# Patient Record
Sex: Female | Born: 1960 | Race: Black or African American | Hispanic: No | Marital: Married | Smoking: Never smoker
Health system: Southern US, Community
[De-identification: ages and names within clinical notes are randomized; demographics above are authoritative.]

## PROBLEM LIST (undated history)

## (undated) DIAGNOSIS — I10 Essential (primary) hypertension: Secondary | ICD-10-CM

## (undated) DIAGNOSIS — E119 Type 2 diabetes mellitus without complications: Secondary | ICD-10-CM

## (undated) DIAGNOSIS — G8929 Other chronic pain: Secondary | ICD-10-CM

## (undated) DIAGNOSIS — I509 Heart failure, unspecified: Secondary | ICD-10-CM

## (undated) DIAGNOSIS — G4730 Sleep apnea, unspecified: Secondary | ICD-10-CM

## (undated) DIAGNOSIS — M549 Dorsalgia, unspecified: Secondary | ICD-10-CM

## (undated) DIAGNOSIS — E7800 Pure hypercholesterolemia, unspecified: Secondary | ICD-10-CM

## (undated) DIAGNOSIS — G709 Myoneural disorder, unspecified: Secondary | ICD-10-CM

## (undated) HISTORY — DX: Heart failure, unspecified: I50.9

## (undated) HISTORY — PX: TUBAL LIGATION: SHX77

## (undated) HISTORY — DX: Type 2 diabetes mellitus without complications: E11.9

## (undated) HISTORY — PX: BACK SURGERY: SHX140

---

## 2005-05-10 ENCOUNTER — Ambulatory Visit: Payer: Self-pay | Admitting: Family Medicine

## 2008-06-18 ENCOUNTER — Ambulatory Visit: Payer: Self-pay | Admitting: Internal Medicine

## 2009-08-25 ENCOUNTER — Ambulatory Visit: Payer: Self-pay | Admitting: Internal Medicine

## 2010-02-09 ENCOUNTER — Encounter: Payer: Self-pay | Admitting: Gastroenterology

## 2010-02-10 ENCOUNTER — Encounter: Payer: Self-pay | Admitting: Gastroenterology

## 2010-02-12 ENCOUNTER — Telehealth (INDEPENDENT_AMBULATORY_CARE_PROVIDER_SITE_OTHER): Payer: Self-pay

## 2010-04-24 ENCOUNTER — Telehealth (INDEPENDENT_AMBULATORY_CARE_PROVIDER_SITE_OTHER): Payer: Self-pay

## 2010-04-24 ENCOUNTER — Encounter: Payer: Self-pay | Admitting: Gastroenterology

## 2010-09-22 NOTE — Letter (Signed)
Summary: Internal Other/triage/instructions  Internal Other/triage/instructions   Imported By: Cloria Spring LPN 16/05/9603 54:09:81  _____________________________________________________________________  External Attachment:    Type:   Image     Comment:   External Document

## 2010-09-22 NOTE — Letter (Signed)
Summary: TRIAGE ORDER  TRIAGE ORDER   Imported By: Ave Filter 02/09/2010 10:46:28  _____________________________________________________________________  External Attachment:    Type:   Image     Comment:   External Document  Appended Document: TRIAGE ORDER [place]. Continue ASA.  Appended Document: TRIAGE ORDER Rx and instructions for TCS was mailed to pt on 02/09/2010.

## 2010-09-22 NOTE — Progress Notes (Signed)
Summary: phone note/ new triage because of delay in pocedure  Phone Note Call from Patient   Summary of Call: Pt was re-triaged because of delay in procedure.  She had her Rx and instructions from the previous one. Initial call taken by: Cloria Spring LPN,  April 24, 2010 8:48 AM

## 2010-09-22 NOTE — Progress Notes (Signed)
Summary: Change in appt for TCS  Phone Note Call from Patient   Caller: Patient Summary of Call: Pt called to reschedule her TCS appt that was scheduled for 03/06/2010. She was moved to 03/20/2010 @ 7:30 AM. She is aware she will need to be at the hospital @ 6:30 AM. Selena Batten @ ENDO was informed. Initial call taken by: Cloria Spring LPN,  February 12, 2010 10:53 AM

## 2010-09-22 NOTE — Letter (Signed)
Summary: Internal Other/triage  Internal Other/triage   Imported By: Cloria Spring LPN 64/40/3474 25:95:63  _____________________________________________________________________  External Attachment:    Type:   Image     Comment:   External Document

## 2011-01-14 ENCOUNTER — Ambulatory Visit (INDEPENDENT_AMBULATORY_CARE_PROVIDER_SITE_OTHER): Payer: BC Managed Care – PPO | Admitting: Psychiatry

## 2011-01-14 DIAGNOSIS — F329 Major depressive disorder, single episode, unspecified: Secondary | ICD-10-CM

## 2011-01-21 ENCOUNTER — Encounter (INDEPENDENT_AMBULATORY_CARE_PROVIDER_SITE_OTHER): Payer: BC Managed Care – PPO | Admitting: Psychiatry

## 2011-01-21 DIAGNOSIS — F329 Major depressive disorder, single episode, unspecified: Secondary | ICD-10-CM

## 2011-02-04 ENCOUNTER — Encounter (INDEPENDENT_AMBULATORY_CARE_PROVIDER_SITE_OTHER): Payer: BC Managed Care – PPO | Admitting: Psychiatry

## 2011-02-04 DIAGNOSIS — F3289 Other specified depressive episodes: Secondary | ICD-10-CM

## 2011-02-04 DIAGNOSIS — F329 Major depressive disorder, single episode, unspecified: Secondary | ICD-10-CM

## 2011-02-11 ENCOUNTER — Encounter (HOSPITAL_COMMUNITY): Payer: BC Managed Care – PPO | Admitting: Psychiatry

## 2011-02-18 ENCOUNTER — Encounter (INDEPENDENT_AMBULATORY_CARE_PROVIDER_SITE_OTHER): Payer: BC Managed Care – PPO | Admitting: Psychiatry

## 2011-02-18 DIAGNOSIS — F329 Major depressive disorder, single episode, unspecified: Secondary | ICD-10-CM

## 2011-03-04 ENCOUNTER — Encounter (INDEPENDENT_AMBULATORY_CARE_PROVIDER_SITE_OTHER): Payer: BC Managed Care – PPO | Admitting: Psychiatry

## 2011-03-04 DIAGNOSIS — F329 Major depressive disorder, single episode, unspecified: Secondary | ICD-10-CM

## 2011-03-22 ENCOUNTER — Encounter (HOSPITAL_COMMUNITY): Payer: BC Managed Care – PPO | Admitting: Psychiatry

## 2011-04-01 ENCOUNTER — Encounter (INDEPENDENT_AMBULATORY_CARE_PROVIDER_SITE_OTHER): Payer: BC Managed Care – PPO | Admitting: Psychiatry

## 2011-04-01 DIAGNOSIS — F329 Major depressive disorder, single episode, unspecified: Secondary | ICD-10-CM

## 2011-05-13 ENCOUNTER — Encounter (HOSPITAL_COMMUNITY): Payer: BC Managed Care – PPO | Admitting: Psychiatry

## 2011-05-14 ENCOUNTER — Encounter (HOSPITAL_COMMUNITY): Payer: BC Managed Care – PPO | Admitting: Psychiatry

## 2011-05-27 ENCOUNTER — Encounter (HOSPITAL_COMMUNITY): Payer: BC Managed Care – PPO | Admitting: Psychiatry

## 2012-02-23 ENCOUNTER — Other Ambulatory Visit (HOSPITAL_COMMUNITY): Payer: Self-pay | Admitting: Family Medicine

## 2012-02-23 DIAGNOSIS — M549 Dorsalgia, unspecified: Secondary | ICD-10-CM

## 2012-02-28 ENCOUNTER — Ambulatory Visit (HOSPITAL_COMMUNITY)
Admission: RE | Admit: 2012-02-28 | Discharge: 2012-02-28 | Disposition: A | Discharge status: Home / Self Care | Payer: BC Managed Care – PPO | Source: Ambulatory Visit | Attending: Family Medicine | Admitting: Family Medicine

## 2012-02-28 ENCOUNTER — Other Ambulatory Visit (HOSPITAL_COMMUNITY): Payer: Self-pay | Admitting: Family Medicine

## 2012-02-28 DIAGNOSIS — M549 Dorsalgia, unspecified: Secondary | ICD-10-CM

## 2012-03-06 ENCOUNTER — Ambulatory Visit: Payer: Self-pay | Admitting: Family Medicine

## 2012-04-26 ENCOUNTER — Telehealth: Payer: Self-pay | Admitting: *Deleted

## 2012-04-26 ENCOUNTER — Telehealth: Payer: Self-pay

## 2012-04-26 NOTE — Telephone Encounter (Signed)
LOM to call.

## 2012-04-26 NOTE — Telephone Encounter (Signed)
See separate triage.  

## 2012-04-26 NOTE — Telephone Encounter (Signed)
Ms Daughety called today. She would like to set up a colonoscopy when you have time. Please call her back. Thank you.

## 2012-04-27 ENCOUNTER — Telehealth: Payer: Self-pay | Admitting: *Deleted

## 2012-04-27 NOTE — Telephone Encounter (Signed)
Bridget Little called today to set up her colonoscopy. She will be at her home number until 1:30 today. Thanks.

## 2012-04-28 ENCOUNTER — Ambulatory Visit: Payer: Self-pay

## 2012-05-02 ENCOUNTER — Other Ambulatory Visit: Payer: Self-pay

## 2012-05-02 ENCOUNTER — Telehealth: Payer: Self-pay

## 2012-05-02 DIAGNOSIS — Z139 Encounter for screening, unspecified: Secondary | ICD-10-CM

## 2012-05-02 NOTE — Telephone Encounter (Signed)
Gastroenterology Pre-Procedure Form     Request Date: 04/27/2012       Requesting Physician: Dr. Doreen Salvage     PATIENT INFORMATION:  Bridget Little is a 51 y.o., female (DOB=20-Aug-1961).  PROCEDURE: Procedure(s) requested: colonoscopy Procedure Reason: screening for colon cancer  PATIENT REVIEW QUESTIONS: The patient reports the following:   1. Diabetes Melitis: no 2. Joint replacements in the past 12 months: no 3. Major health problems in the past 3 months: no 4. Has an artificial valve or MVP:no 5. Has been advised in past to take antibiotics in advance of a procedure like teeth cleaning: no}    MEDICATIONS & ALLERGIES:    Patient reports the following regarding taking any blood thinners:   Plavix? no Aspirin?yes  Coumadin?  no  Patient confirms/reports the following medications:  Current Outpatient Prescriptions  Medication Sig Dispense Refill  . aspirin 81 MG tablet Take 81 mg by mouth daily.      . diclofenac (VOLTAREN) 75 MG EC tablet Take 75 mg by mouth 2 (two) times daily.      . furosemide (LASIX) 20 MG tablet Take 20 mg by mouth daily. As needed      . HYDROcodone-acetaminophen (NORCO) 7.5-325 MG per tablet Take 1 tablet by mouth every 4 (four) hours as needed.      Marland Kitchen lisinopril-hydrochlorothiazide (PRINZIDE,ZESTORETIC) 20-25 MG per tablet Take 1 tablet by mouth daily.      . NON FORMULARY Epi pen as needed for bee stings      . topiramate (TOPAMAX) 50 MG tablet Take 50 mg by mouth daily.        Patient confirms/reports the following allergies:  No Known Allergies  Patient is appropriate to schedule for requested procedure(s): yes  AUTHORIZATION INFORMATION Primary Insurance:   ID #:   Group #:  Pre-Cert / Auth required: Pre-Cert / Auth #:   Secondary Insurance  ID #:   Group #:  Pre-Cert / Auth required: Pre-Cert / Auth #:   No orders of the defined types were placed in this encounter.    SCHEDULE INFORMATION: Procedure has been scheduled as  follows:  Date: 06/02/2012                Time: 8:30 AM  Location: [facility] Short Stay  This Gastroenterology Pre-Precedure Form is being routed to the following provider(s) for review: Jonette Eva, MD

## 2012-05-02 NOTE — Telephone Encounter (Signed)
MOVI PREP SPLIT DOSING, REGULAR BREAKFAST. CLEAR LIQUIDS AFTER 9 AM.  HOLD VOLTAREN AND LASIX ON DAY BEFORE AND DAY OF PROCEDURE.

## 2012-05-02 NOTE — Telephone Encounter (Signed)
See separate triage.  

## 2012-05-03 MED ORDER — PEG-KCL-NACL-NASULF-NA ASC-C 100 G PO SOLR: 1.0000 | ORAL | Status: DC

## 2012-05-03 NOTE — Progress Notes (Signed)
Rx sent to the pharmacy. Instructions mailed to pt.  

## 2012-05-03 NOTE — Telephone Encounter (Signed)
See separate triage dated 05/02/2012.

## 2012-05-03 NOTE — Telephone Encounter (Signed)
Rx sent to pharmacy. Instructions mailed to pt.

## 2012-05-15 ENCOUNTER — Ambulatory Visit (HOSPITAL_COMMUNITY): Payer: BC Managed Care – PPO | Admitting: Physical Therapy

## 2012-05-17 ENCOUNTER — Telehealth: Payer: Self-pay | Admitting: Gastroenterology

## 2012-05-17 NOTE — Telephone Encounter (Signed)
Pt called to update meds. She has had some added on since she was triaged for the colonoscopy. She is scheduled for that on 06/02/2012. I have added the additional meds to the med list. Below are the ones she added.   1. Oxydodone  10/325 mg     Given by [facility]     She uses this in place of the Hydrocodone if her pain is severe up to tid  2.  Valium 5 mg      Only as needed  3.  Gabapentine 300 mg  Tid       Given by Dr. Retia Passe of the [facility]  4.  Provera  10 mg qd      By GYN  5.  Skelaxin  800 mg  Tid, prn     Said she rarely takes this

## 2012-05-17 NOTE — Telephone Encounter (Signed)
Pt called to give [place] her new medications she is on. Pt is scheduled for a procedure on 06/02/12. She can be reached at [phone]

## 2012-05-19 NOTE — Telephone Encounter (Signed)
REVIEWED.  

## 2012-05-24 ENCOUNTER — Encounter (HOSPITAL_COMMUNITY): Payer: Self-pay | Admitting: Pharmacy Technician

## 2012-05-29 ENCOUNTER — Telehealth: Payer: Self-pay

## 2012-05-29 NOTE — Telephone Encounter (Signed)
I had a POST IT NOTE on my desk when I came back from lunch to call pt in reference to questions about insurance. I called, LMOM to call.

## 2012-05-30 NOTE — Telephone Encounter (Signed)
Pt called back and said she checked her insurance and they said they would cover colonoscopy.

## 2012-05-31 ENCOUNTER — Telehealth: Payer: Self-pay

## 2012-05-31 NOTE — Telephone Encounter (Signed)
LMOM to call. Need to update triage prior to procedure on 06/02/2012.

## 2012-06-01 MED ORDER — SODIUM CHLORIDE 0.45 % IV SOLN
INTRAVENOUS | Status: DC
  Administered 2012-06-02: 08:00:00 via INTRAVENOUS

## 2012-06-01 NOTE — Telephone Encounter (Signed)
Pt has not had any change in meds and no new problems since she was triaged. I told her OK to take BP pill ( Lisinopril ) with sip of water on day of procedure. She is scheduled for 06/02/2012.Marland Kitchen

## 2012-06-02 ENCOUNTER — Encounter (HOSPITAL_COMMUNITY)
Admission: RE | Disposition: A | Discharge status: Home / Self Care | Payer: Self-pay | Source: Ambulatory Visit | Attending: Gastroenterology

## 2012-06-02 ENCOUNTER — Ambulatory Visit (HOSPITAL_COMMUNITY)
Admission: RE | Admit: 2012-06-02 | Discharge: 2012-06-02 | Disposition: A | Discharge status: Home / Self Care | Payer: BC Managed Care – PPO | Source: Ambulatory Visit | Attending: Gastroenterology | Admitting: Gastroenterology

## 2012-06-02 ENCOUNTER — Encounter (HOSPITAL_COMMUNITY): Payer: Self-pay | Admitting: *Deleted

## 2012-06-02 DIAGNOSIS — Z1211 Encounter for screening for malignant neoplasm of colon: Secondary | ICD-10-CM

## 2012-06-02 DIAGNOSIS — K648 Other hemorrhoids: Secondary | ICD-10-CM | POA: Insufficient documentation

## 2012-06-02 DIAGNOSIS — Z139 Encounter for screening, unspecified: Secondary | ICD-10-CM

## 2012-06-02 DIAGNOSIS — I10 Essential (primary) hypertension: Secondary | ICD-10-CM | POA: Insufficient documentation

## 2012-06-02 HISTORY — DX: Myoneural disorder, unspecified: G70.9

## 2012-06-02 HISTORY — DX: Dorsalgia, unspecified: M54.9

## 2012-06-02 HISTORY — DX: Pure hypercholesterolemia, unspecified: E78.00

## 2012-06-02 HISTORY — PX: COLONOSCOPY: SHX5424

## 2012-06-02 HISTORY — DX: Other chronic pain: G89.29

## 2012-06-02 SURGERY — COLONOSCOPY
Anesthesia: Moderate Sedation

## 2012-06-02 MED ORDER — MIDAZOLAM HCL 5 MG/5ML IJ SOLN
INTRAMUSCULAR | Status: AC
  Filled 2012-06-02: qty 10

## 2012-06-02 MED ORDER — MEPERIDINE HCL 100 MG/ML IJ SOLN
INTRAMUSCULAR | Status: AC
  Filled 2012-06-02: qty 2

## 2012-06-02 MED ORDER — STERILE WATER FOR IRRIGATION IR SOLN
Status: DC | PRN
  Administered 2012-06-02: 09:00:00

## 2012-06-02 MED ORDER — MIDAZOLAM HCL 5 MG/5ML IJ SOLN
INTRAMUSCULAR | Status: DC | PRN
  Administered 2012-06-02: 1 mg via INTRAVENOUS
  Administered 2012-06-02 (×2): 2 mg via INTRAVENOUS

## 2012-06-02 MED ORDER — MEPERIDINE HCL 100 MG/ML IJ SOLN
INTRAMUSCULAR | Status: DC | PRN
  Administered 2012-06-02: 25 mg via INTRAVENOUS
  Administered 2012-06-02: 50 mg via INTRAVENOUS
  Administered 2012-06-02: 25 mg via INTRAVENOUS

## 2012-06-02 NOTE — Op Note (Addendum)
[facility] [address] [place], [address]   COLONOSCOPY PROCEDURE REPORT  PATIENT: Bridget Little, Bridget Little  MR#: [id number] BIRTHDATE: 07-15-1961 , 51  yrs. old GENDER: Female ENDOSCOPIST: Jonette Eva, MD REFERRED VH:QIONGE Dahlia Bailiff, M.D. PROCEDURE DATE:  06/02/2012 PROCEDURE:   INCOMPLETE Colonoscopy, screening INDICATIONS:average risk patient for colon cancer. MEDICATIONS: Demerol 100 mg IV and Versed 5 mg IV  DESCRIPTION OF PROCEDURE:    Physical exam was performed.  Informed consent was obtained from the patient after explaining the benefits, risks, and alternatives to procedure.  The patient was connected to monitor and placed in left lateral position. Continuous oxygen was provided by nasal cannula and IV medicine administered through an indwelling cannula.  After administration of sedation and rectal exam, the patients rectum was intubated and the Pentax Colonoscope [id number]  colonoscope was advanced under direct visualization to the ILEO-CECAL VALVE.  The scope was removed slowly by carefully examining the color, texture, anatomy, and integrity mucosa on the way out.  The patient was recovered in endoscopy and discharged home in satisfactory condition.       COLON FINDINGS: The colon was otherwise normal.  There was no diverticulosis, inflammation, polyps or cancers unless previously stated. REDUNDNAT COLON WHICH DID NOT ALLOW FOR CECAL INTUBATION INSPITE OF CHANGING POSITION AND PRERSSURE. and Small internal hemorrhoids were found.  PREP QUALITY: good. WITHDRAWAL TIME FROM IV VALVE: 6 minutes  COMPLICATIONS: None  ENDOSCOPIC IMPRESSION: 1.   The colon was otherwise normal. COMPLETE CECUM NOT SEEN. 2.   Small internal hemorrhoids   RECOMMENDATIONS: PT SHOULD INITIATE A WEIGHT LOSS PROGRAM.  HIGH FIBER/LOW FAT DIET  TCS IN 5 YEARS WITH AN OVERTUBE.       _______________________________ Rosalie DoctorJonette Eva, MD 06/02/2012 9:48  AM Revised: 06/02/2012 9:48 AM    PATIENT NAME:  Bridget Little, Bridget Little MR#: [id number]

## 2012-06-02 NOTE — H&P (Signed)
Primary Care Physician:  Margorie John, MD Primary Gastroenterologist:  Dr. Darrick Penna  Pre-Procedure History & Physical: HPI:  Bridget Little is a 51 y.o. female here for COLON CANCER SCREENING.   Past Medical History  Diagnosis Date  . Hypertension   . Hypercholesteremia   . Numbness and tingling in right hand   . Chronic back pain   . Neuromuscular disorder     numbness in right hand and left foot    Past Surgical History  Procedure Date  . Tubal ligation     Prior to Admission medications   Medication Sig Start Date End Date Taking? Authorizing Provider  aspirin 81 MG tablet Take 81 mg by mouth daily.   Yes Historical Provider, MD  Calcium Carb-Cholecalciferol (CALCIUM + D3) 600-200 MG-UNIT TABS Take 1 tablet by mouth 2 (two) times daily.   Yes Historical Provider, MD  diazepam (VALIUM) 5 MG tablet Take 5 mg by mouth every 6 (six) hours as needed. She said she seldom takes this   Yes Historical Provider, MD  furosemide (LASIX) 20 MG tablet Take 20 mg by mouth daily as needed. fluid   Yes Historical Provider, MD  gabapentin (NEURONTIN) 300 MG capsule Take 300 mg by mouth 3 (three) times daily.   Yes Historical Provider, MD  HYDROcodone-acetaminophen (NORCO) 7.5-325 MG per tablet Take 1 tablet by mouth every 4 (four) hours as needed. pain   Yes Historical Provider, MD  lisinopril-hydrochlorothiazide (PRINZIDE,ZESTORETIC) 20-25 MG per tablet Take 1 tablet by mouth daily.   Yes Historical Provider, MD  medroxyPROGESTERone (PROVERA) 10 MG tablet Take 10 mg by mouth daily.   Yes Historical Provider, MD  metaxalone (SKELAXIN) 800 MG tablet Take 800 mg by mouth 3 (three) times daily as needed. Said she seldom takes this   Yes Historical Provider, MD  oxyCODONE-acetaminophen (PERCOCET) 10-325 MG per tablet Take 1 tablet by mouth every 8 (eight) hours as needed. She takes this instead of her Hydrocodone if her pain is severe. She does not take this and Hydrocodone together.   Yes  Historical Provider, MD  NON FORMULARY Epi pen as needed for bee stings    Historical Provider, MD    Allergies as of 05/02/2012  . (No Known Allergies)    Family History  Problem Relation Age of Onset  . Colon cancer Neg Hx     History   Social History  . Marital Status: Married    Spouse Name: N/A    Number of Children: N/A  . Years of Education: N/A   Occupational History  . Not on file.   Social History Main Topics  . Smoking status: Never Smoker   . Smokeless tobacco: Not on file  . Alcohol Use: Yes     Occasionally  . Drug Use: No  . Sexually Active:    Other Topics Concern  . Not on file   Social History Narrative  . No narrative on file    Review of Systems: See HPI, otherwise negative ROS   Physical Exam: BP 147/79  Pulse 92  Temp 97.6 F (36.4 C) (Oral)  Resp 22  Ht 5\' 4"  (1.626 m)  Wt 290 lb (131.543 kg)  BMI 49.78 kg/m2  SpO2 94%  LMP 05/19/2012 General:   Alert,  pleasant and cooperative in NAD Head:  Normocephalic and atraumatic. Neck:  Supple; Lungs:  Clear throughout to auscultation.    Heart:  Regular rate and rhythm. Abdomen:  Soft, nontender and nondistended. Normal bowel sounds, without  guarding, and without rebound.   Neurologic:  Alert and  oriented x4;  grossly normal neurologically.  Impression/Plan:     SCREENING  Plan:  1. TCS TODAY

## 2012-06-09 ENCOUNTER — Encounter (HOSPITAL_COMMUNITY): Payer: Self-pay | Admitting: Gastroenterology

## 2013-06-04 ENCOUNTER — Ambulatory Visit: Payer: BC Managed Care – PPO | Admitting: Cardiology

## 2013-06-12 ENCOUNTER — Ambulatory Visit: Payer: Self-pay

## 2013-06-25 ENCOUNTER — Ambulatory Visit: Payer: BC Managed Care – PPO | Admitting: Cardiology

## 2013-10-22 ENCOUNTER — Encounter (HOSPITAL_COMMUNITY): Payer: Self-pay | Admitting: Emergency Medicine

## 2013-10-22 ENCOUNTER — Inpatient Hospital Stay (HOSPITAL_COMMUNITY): Payer: BC Managed Care – PPO

## 2013-10-22 ENCOUNTER — Emergency Department (HOSPITAL_COMMUNITY): Payer: BC Managed Care – PPO

## 2013-10-22 ENCOUNTER — Inpatient Hospital Stay (HOSPITAL_COMMUNITY)
Admission: EM | Admit: 2013-10-22 | Discharge: 2013-10-28 | DRG: 286 | Disposition: A | Discharge status: Home / Self Care | Payer: BC Managed Care – PPO | Attending: Family Medicine | Admitting: Family Medicine

## 2013-10-22 DIAGNOSIS — I509 Heart failure, unspecified: Secondary | ICD-10-CM | POA: Diagnosis present

## 2013-10-22 DIAGNOSIS — I2510 Atherosclerotic heart disease of native coronary artery without angina pectoris: Secondary | ICD-10-CM | POA: Diagnosis present

## 2013-10-22 DIAGNOSIS — R062 Wheezing: Secondary | ICD-10-CM

## 2013-10-22 DIAGNOSIS — E119 Type 2 diabetes mellitus without complications: Secondary | ICD-10-CM | POA: Diagnosis present

## 2013-10-22 DIAGNOSIS — R778 Other specified abnormalities of plasma proteins: Secondary | ICD-10-CM | POA: Diagnosis present

## 2013-10-22 DIAGNOSIS — I10 Essential (primary) hypertension: Secondary | ICD-10-CM | POA: Diagnosis present

## 2013-10-22 DIAGNOSIS — E11649 Type 2 diabetes mellitus with hypoglycemia without coma: Secondary | ICD-10-CM | POA: Diagnosis present

## 2013-10-22 DIAGNOSIS — I428 Other cardiomyopathies: Secondary | ICD-10-CM | POA: Diagnosis present

## 2013-10-22 DIAGNOSIS — R0989 Other specified symptoms and signs involving the circulatory and respiratory systems: Secondary | ICD-10-CM

## 2013-10-22 DIAGNOSIS — Z6841 Body Mass Index (BMI) 40.0 and over, adult: Secondary | ICD-10-CM

## 2013-10-22 DIAGNOSIS — I5021 Acute systolic (congestive) heart failure: Secondary | ICD-10-CM | POA: Diagnosis present

## 2013-10-22 DIAGNOSIS — E7800 Pure hypercholesterolemia, unspecified: Secondary | ICD-10-CM | POA: Diagnosis present

## 2013-10-22 DIAGNOSIS — R0609 Other forms of dyspnea: Secondary | ICD-10-CM

## 2013-10-22 DIAGNOSIS — R209 Unspecified disturbances of skin sensation: Secondary | ICD-10-CM | POA: Diagnosis present

## 2013-10-22 DIAGNOSIS — R799 Abnormal finding of blood chemistry, unspecified: Secondary | ICD-10-CM

## 2013-10-22 DIAGNOSIS — J209 Acute bronchitis, unspecified: Secondary | ICD-10-CM | POA: Diagnosis present

## 2013-10-22 DIAGNOSIS — G8929 Other chronic pain: Secondary | ICD-10-CM | POA: Diagnosis present

## 2013-10-22 DIAGNOSIS — J811 Chronic pulmonary edema: Secondary | ICD-10-CM | POA: Diagnosis present

## 2013-10-22 DIAGNOSIS — R7989 Other specified abnormal findings of blood chemistry: Secondary | ICD-10-CM

## 2013-10-22 DIAGNOSIS — M549 Dorsalgia, unspecified: Secondary | ICD-10-CM | POA: Diagnosis present

## 2013-10-22 DIAGNOSIS — R0600 Dyspnea, unspecified: Secondary | ICD-10-CM

## 2013-10-22 DIAGNOSIS — J9601 Acute respiratory failure with hypoxia: Secondary | ICD-10-CM | POA: Diagnosis present

## 2013-10-22 DIAGNOSIS — I5043 Acute on chronic combined systolic (congestive) and diastolic (congestive) heart failure: Principal | ICD-10-CM | POA: Diagnosis present

## 2013-10-22 DIAGNOSIS — Z7982 Long term (current) use of aspirin: Secondary | ICD-10-CM

## 2013-10-22 DIAGNOSIS — J9600 Acute respiratory failure, unspecified whether with hypoxia or hypercapnia: Secondary | ICD-10-CM | POA: Diagnosis present

## 2013-10-22 HISTORY — DX: Essential (primary) hypertension: I10

## 2013-10-22 LAB — CBC WITH DIFFERENTIAL/PLATELET
BASOS ABS: 0 10*3/uL (ref 0.0–0.1)
Basophils Relative: 0 % (ref 0–1)
EOS PCT: 1 % (ref 0–5)
Eosinophils Absolute: 0.1 10*3/uL (ref 0.0–0.7)
HEMATOCRIT: 41.5 % (ref 36.0–46.0)
HEMOGLOBIN: 12.9 g/dL (ref 12.0–15.0)
LYMPHS PCT: 14 % (ref 12–46)
Lymphs Abs: 1.6 10*3/uL (ref 0.7–4.0)
MCH: 25.3 pg — ABNORMAL LOW (ref 26.0–34.0)
MCHC: 31.1 g/dL (ref 30.0–36.0)
MCV: 81.5 fL (ref 78.0–100.0)
MONO ABS: 1.1 10*3/uL — AB (ref 0.1–1.0)
MONOS PCT: 9 % (ref 3–12)
Neutro Abs: 8.8 10*3/uL — ABNORMAL HIGH (ref 1.7–7.7)
Neutrophils Relative %: 75 % (ref 43–77)
Platelets: 378 10*3/uL (ref 150–400)
RBC: 5.09 MIL/uL (ref 3.87–5.11)
RDW: 19.2 % — AB (ref 11.5–15.5)
WBC: 11.7 10*3/uL — AB (ref 4.0–10.5)

## 2013-10-22 LAB — TROPONIN I
Troponin I: 0.8 ng/mL (ref <0.30)
Troponin I: 0.63 ng/mL (ref <0.30)
Troponin I: 0.48 ng/mL (ref <0.30)

## 2013-10-22 LAB — COMPREHENSIVE METABOLIC PANEL
ALT: 28 U/L (ref 0–35)
AST: 27 U/L (ref 0–37)
Albumin: 3.2 g/dL — ABNORMAL LOW (ref 3.5–5.2)
Alkaline Phosphatase: 103 U/L (ref 39–117)
BILIRUBIN TOTAL: 0.2 mg/dL — AB (ref 0.3–1.2)
BUN: 24 mg/dL — AB (ref 6–23)
CALCIUM: 8.9 mg/dL (ref 8.4–10.5)
CO2: 27 meq/L (ref 19–32)
CREATININE: 0.74 mg/dL (ref 0.50–1.10)
Chloride: 105 mEq/L (ref 96–112)
GLUCOSE: 180 mg/dL — AB (ref 70–99)
Potassium: 4 mEq/L (ref 3.7–5.3)
Sodium: 143 mEq/L (ref 137–147)
Total Protein: 7 g/dL (ref 6.0–8.3)

## 2013-10-22 LAB — GLUCOSE, CAPILLARY
GLUCOSE-CAPILLARY: 258 mg/dL — AB (ref 70–99)
Glucose-Capillary: 221 mg/dL — ABNORMAL HIGH (ref 70–99)
Glucose-Capillary: 264 mg/dL — ABNORMAL HIGH (ref 70–99)

## 2013-10-22 LAB — TSH: TSH: 2.752 u[IU]/mL (ref 0.350–4.500)

## 2013-10-22 LAB — HEMOGLOBIN A1C
Hgb A1c MFr Bld: 6.1 % — ABNORMAL HIGH (ref <5.7)
Mean Plasma Glucose: 128 mg/dL — ABNORMAL HIGH (ref <117)

## 2013-10-22 LAB — D-DIMER, QUANTITATIVE (NOT AT ARMC): D DIMER QUANT: 0.78 ug{FEU}/mL — AB (ref 0.00–0.48)

## 2013-10-22 LAB — PRO B NATRIURETIC PEPTIDE: PRO B NATRI PEPTIDE: 642.6 pg/mL — AB (ref 0–125)

## 2013-10-22 MED ORDER — ENOXAPARIN SODIUM 80 MG/0.8ML ~~LOC~~ SOLN: 70.0000 mg | SUBCUTANEOUS | Status: DC

## 2013-10-22 MED ORDER — LEVOFLOXACIN IN D5W 750 MG/150ML IV SOLN
750.0000 mg | INTRAVENOUS | Status: DC
  Administered 2013-10-22 – 2013-10-23 (×2): 750 mg via INTRAVENOUS
  Filled 2013-10-22 (×2): qty 150

## 2013-10-22 MED ORDER — ONDANSETRON HCL 4 MG/2ML IJ SOLN: 4.0000 mg | Freq: Four times a day (QID) | INTRAMUSCULAR | Status: DC | PRN

## 2013-10-22 MED ORDER — ACETAMINOPHEN 325 MG PO TABS: 650.0000 mg | ORAL_TABLET | Freq: Four times a day (QID) | ORAL | Status: DC | PRN

## 2013-10-22 MED ORDER — FUROSEMIDE 10 MG/ML IJ SOLN
40.0000 mg | Freq: Once | INTRAMUSCULAR | Status: AC
  Administered 2013-10-22: 40 mg via INTRAVENOUS
  Filled 2013-10-22: qty 4

## 2013-10-22 MED ORDER — FUROSEMIDE 10 MG/ML IJ SOLN
60.0000 mg | Freq: Two times a day (BID) | INTRAMUSCULAR | Status: DC
  Administered 2013-10-22 – 2013-10-26 (×8): 60 mg via INTRAVENOUS
  Filled 2013-10-22 (×8): qty 6

## 2013-10-22 MED ORDER — SODIUM CHLORIDE 0.9 % IV SOLN
250.0000 mL | INTRAVENOUS | Status: DC | PRN
  Administered 2013-10-22: 250 mL via INTRAVENOUS

## 2013-10-22 MED ORDER — OXYCODONE HCL 5 MG PO TABS: 5.0000 mg | ORAL_TABLET | ORAL | Status: DC | PRN

## 2013-10-22 MED ORDER — IOHEXOL 350 MG/ML SOLN
100.0000 mL | Freq: Once | INTRAVENOUS | Status: AC | PRN
  Administered 2013-10-22: 100 mL via INTRAVENOUS

## 2013-10-22 MED ORDER — SODIUM CHLORIDE 0.9 % IJ SOLN
3.0000 mL | INTRAMUSCULAR | Status: DC | PRN
  Administered 2013-10-23: 3 mL via INTRAVENOUS

## 2013-10-22 MED ORDER — HYDRALAZINE HCL 20 MG/ML IJ SOLN: 10.0000 mg | Freq: Four times a day (QID) | INTRAMUSCULAR | Status: DC | PRN

## 2013-10-22 MED ORDER — ENOXAPARIN SODIUM 40 MG/0.4ML ~~LOC~~ SOLN: 40.0000 mg | SUBCUTANEOUS | Status: DC

## 2013-10-22 MED ORDER — METHYLPREDNISOLONE SODIUM SUCC 125 MG IJ SOLR
125.0000 mg | Freq: Once | INTRAMUSCULAR | Status: AC
  Administered 2013-10-22: 125 mg via INTRAVENOUS
  Filled 2013-10-22: qty 2

## 2013-10-22 MED ORDER — SODIUM CHLORIDE 0.9 % IJ SOLN
3.0000 mL | Freq: Two times a day (BID) | INTRAMUSCULAR | Status: DC
  Administered 2013-10-23 – 2013-10-27 (×9): 3 mL via INTRAVENOUS

## 2013-10-22 MED ORDER — LEVALBUTEROL HCL 0.63 MG/3ML IN NEBU: 0.6300 mg | INHALATION_SOLUTION | RESPIRATORY_TRACT | Status: DC | PRN

## 2013-10-22 MED ORDER — IPRATROPIUM-ALBUTEROL 0.5-2.5 (3) MG/3ML IN SOLN
3.0000 mL | Freq: Once | RESPIRATORY_TRACT | Status: AC
  Administered 2013-10-22: 3 mL via RESPIRATORY_TRACT
  Filled 2013-10-22: qty 3

## 2013-10-22 MED ORDER — INSULIN ASPART 100 UNIT/ML ~~LOC~~ SOLN
0.0000 [IU] | Freq: Every day | SUBCUTANEOUS | Status: DC
  Administered 2013-10-22: 3 [IU] via SUBCUTANEOUS
  Administered 2013-10-23 – 2013-10-26 (×4): 2 [IU] via SUBCUTANEOUS

## 2013-10-22 MED ORDER — INSULIN ASPART 100 UNIT/ML ~~LOC~~ SOLN
0.0000 [IU] | Freq: Three times a day (TID) | SUBCUTANEOUS | Status: DC
Start: 2013-10-22 — End: 2013-10-28
  Administered 2013-10-22: 11 [IU] via SUBCUTANEOUS
  Administered 2013-10-22: 7 [IU] via SUBCUTANEOUS
  Administered 2013-10-23: 11 [IU] via SUBCUTANEOUS
  Administered 2013-10-24: 4 [IU] via SUBCUTANEOUS
  Administered 2013-10-24: 3 [IU] via SUBCUTANEOUS
  Administered 2013-10-25: 7 [IU] via SUBCUTANEOUS
  Administered 2013-10-25 (×2): 4 [IU] via SUBCUTANEOUS
  Administered 2013-10-26: 7 [IU] via SUBCUTANEOUS
  Administered 2013-10-26 (×2): 4 [IU] via SUBCUTANEOUS
  Administered 2013-10-27: 7 [IU] via SUBCUTANEOUS
  Administered 2013-10-27 – 2013-10-28 (×3): 4 [IU] via SUBCUTANEOUS

## 2013-10-22 MED ORDER — METHYLPREDNISOLONE SODIUM SUCC 125 MG IJ SOLR
60.0000 mg | Freq: Four times a day (QID) | INTRAMUSCULAR | Status: DC
  Administered 2013-10-22 – 2013-10-23 (×5): 60 mg via INTRAVENOUS
  Filled 2013-10-22 (×5): qty 2

## 2013-10-22 MED ORDER — ONDANSETRON HCL 4 MG PO TABS: 4.0000 mg | ORAL_TABLET | Freq: Four times a day (QID) | ORAL | Status: DC | PRN

## 2013-10-22 MED ORDER — ASPIRIN EC 81 MG PO TBEC
81.0000 mg | DELAYED_RELEASE_TABLET | Freq: Every day | ORAL | Status: DC
  Administered 2013-10-22 – 2013-10-28 (×7): 81 mg via ORAL
  Filled 2013-10-22 (×7): qty 1

## 2013-10-22 MED ORDER — ACETAMINOPHEN 650 MG RE SUPP: 650.0000 mg | Freq: Four times a day (QID) | RECTAL | Status: DC | PRN

## 2013-10-22 MED ORDER — ALBUTEROL SULFATE (2.5 MG/3ML) 0.083% IN NEBU
2.5000 mg | INHALATION_SOLUTION | Freq: Once | RESPIRATORY_TRACT | Status: AC
  Administered 2013-10-22: 2.5 mg via RESPIRATORY_TRACT
  Filled 2013-10-22: qty 3

## 2013-10-22 MED ORDER — LEVALBUTEROL HCL 0.63 MG/3ML IN NEBU
0.6300 mg | INHALATION_SOLUTION | RESPIRATORY_TRACT | Status: DC
  Administered 2013-10-22 – 2013-10-24 (×13): 0.63 mg via RESPIRATORY_TRACT
  Filled 2013-10-22 (×13): qty 3

## 2013-10-22 MED ORDER — IPRATROPIUM BROMIDE 0.02 % IN SOLN
0.5000 mg | Freq: Once | RESPIRATORY_TRACT | Status: AC
  Administered 2013-10-22: 0.5 mg via RESPIRATORY_TRACT
  Filled 2013-10-22: qty 2.5

## 2013-10-22 MED ORDER — SODIUM CHLORIDE 0.9 % IJ SOLN
3.0000 mL | Freq: Two times a day (BID) | INTRAMUSCULAR | Status: DC
  Administered 2013-10-22 – 2013-10-23 (×3): 3 mL via INTRAVENOUS

## 2013-10-22 MED ORDER — ENOXAPARIN SODIUM 150 MG/ML ~~LOC~~ SOLN
150.0000 mg | Freq: Two times a day (BID) | SUBCUTANEOUS | Status: DC
  Administered 2013-10-22 – 2013-10-23 (×4): 150 mg via SUBCUTANEOUS
  Filled 2013-10-22 (×9): qty 1

## 2013-10-22 NOTE — ED Notes (Signed)
Patient states she began having symptoms of sinus infection, but tonight presents with shortness of breath and wheezing.

## 2013-10-22 NOTE — ED Provider Notes (Signed)
CSN: [id number]     Arrival date & time 10/22/13  2440 History   First MD Initiated Contact with Patient 10/22/13 0408     Chief Complaint  Patient presents with  . Wheezing     (Consider location/radiation/quality/duration/timing/severity/associated sxs/prior Treatment) Patient is a 53 y.o. female presenting with wheezing. The history is provided by the patient.  Wheezing She started coughing yesterday afternoon and it has gotten progressively worse. Cough has been associated with wheezing. Cough has been nonproductive. She denies fever, chills, sweats but she has had a tight feeling in her chest. There's been no nausea vomiting or diarrhea. There've been no arthralgias or myalgias. There was a contact in that her husband had a respiratory infection recently. She has not been able to treated at home. She did notice that symptoms are worse when she tried to exert herself but were not affected by lying flat.  Past Medical History  Diagnosis Date  . Hypertension   . Hypercholesteremia   . Numbness and tingling in right hand   . Chronic back pain   . Neuromuscular disorder     numbness in right hand and left foot   Past Surgical History  Procedure Laterality Date  . Tubal ligation    . Colonoscopy  06/02/2012    Procedure: COLONOSCOPY;  Surgeon: Danie Binder, MD;  Location: [facility] SUITE;  Service: Endoscopy;  Laterality: N/A;  8:30 AM   Family History  Problem Relation Age of Onset  . Colon cancer Neg Hx    History  Substance Use Topics  . Smoking status: Never Smoker   . Smokeless tobacco: Not on file  . Alcohol Use: Yes     Comment: Occasionally   OB History   Grav Para Term Preterm Abortions TAB SAB Ect Mult Living                 Review of Systems  Respiratory: Positive for wheezing.   All other systems reviewed and are negative.      Allergies  Review of patient's allergies indicates no known allergies.  Home Medications   Current Outpatient Rx  Name   Route  Sig  Dispense  Refill  . aspirin 81 MG tablet   Oral   Take 81 mg by mouth daily.         . Calcium Carb-Cholecalciferol (CALCIUM + D3) 600-200 MG-UNIT TABS   Oral   Take 1 tablet by mouth 2 (two) times daily.         . diazepam (VALIUM) 5 MG tablet   Oral   Take 5 mg by mouth every 6 (six) hours as needed. She said she seldom takes this         . furosemide (LASIX) 20 MG tablet   Oral   Take 20 mg by mouth daily as needed. fluid         . gabapentin (NEURONTIN) 300 MG capsule   Oral   Take 300 mg by mouth 3 (three) times daily.         Marland Kitchen HYDROcodone-acetaminophen (NORCO) 7.5-325 MG per tablet   Oral   Take 1 tablet by mouth every 4 (four) hours as needed. pain         . lisinopril-hydrochlorothiazide (PRINZIDE,ZESTORETIC) 20-25 MG per tablet   Oral   Take 1 tablet by mouth daily.         . medroxyPROGESTERone (PROVERA) 10 MG tablet   Oral   Take 10 mg by mouth daily.         Marland Kitchen  metaxalone (SKELAXIN) 800 MG tablet   Oral   Take 800 mg by mouth 3 (three) times daily as needed. Said she seldom takes this         . NON FORMULARY      Epi pen as needed for bee stings         . oxyCODONE-acetaminophen (PERCOCET) 10-325 MG per tablet   Oral   Take 1 tablet by mouth every 8 (eight) hours as needed. She takes this instead of her Hydrocodone if her pain is severe. She does not take this and Hydrocodone together.          BP 162/87  Pulse 131  Temp(Src) 98.7 F (37.1 C) (Oral)  Resp 40  Ht 5\' 4"  (1.626 m)  Wt 310 lb (140.615 kg)  BMI 53.19 kg/m2  SpO2 93% Physical Exam  Nursing note and vitals reviewed.  Morbidly obese 53 year old female, resting comfortably and in no acute distress. Vital signs are significant for hypertension with blood pressure 162/87, tachycardia with heart rate 131, and tachypnea with respiratory rate of 40. Oxygen saturation is 93%, which is normal. Head is normocephalic and atraumatic. PERRLA, EOMI. Oropharynx is  clear. Neck is nontender and supple without adenopathy or JVD. Back is nontender and there is no CVA tenderness. Lungs have diffuse expiratory wheezes with prolonged exhalation phase. No rales are heard. Chest is nontender. Heart has regular rate and rhythm without murmur. Abdomen is soft, flat, nontender without masses or hepatosplenomegaly and peristalsis is normoactive. Extremities have 2-3+ edema, full range of motion is present. Skin is warm and dry without rash. Neurologic: Mental status is normal, cranial nerves are intact, there are no motor or sensory deficits.  ED Course  Procedures (including critical care time) Labs Review Results for orders placed during the hospital encounter of 10/22/13  CBC WITH DIFFERENTIAL      Result Value Ref Range   WBC 11.7 (*) 4.0 - 10.5 K/uL   RBC 5.09  3.87 - 5.11 MIL/uL   Hemoglobin 12.9  12.0 - 15.0 g/dL   HCT 41.5  36.0 - 46.0 %   MCV 81.5  78.0 - 100.0 fL   MCH 25.3 (*) 26.0 - 34.0 pg   MCHC 31.1  30.0 - 36.0 g/dL   RDW 19.2 (*) 11.5 - 15.5 %   Platelets 378  150 - 400 K/uL   Neutrophils Relative % 75  43 - 77 %   Neutro Abs 8.8 (*) 1.7 - 7.7 K/uL   Lymphocytes Relative 14  12 - 46 %   Lymphs Abs 1.6  0.7 - 4.0 K/uL   Monocytes Relative 9  3 - 12 %   Monocytes Absolute 1.1 (*) 0.1 - 1.0 K/uL   Eosinophils Relative 1  0 - 5 %   Eosinophils Absolute 0.1  0.0 - 0.7 K/uL   Basophils Relative 0  0 - 1 %   Basophils Absolute 0.0  0.0 - 0.1 K/uL  COMPREHENSIVE METABOLIC PANEL      Result Value Ref Range   Sodium 143  137 - 147 mEq/L   Potassium 4.0  3.7 - 5.3 mEq/L   Chloride 105  96 - 112 mEq/L   CO2 27  19 - 32 mEq/L   Glucose, Bld 180 (*) 70 - 99 mg/dL   BUN 24 (*) 6 - 23 mg/dL   Creatinine, Ser 0.74  0.50 - 1.10 mg/dL   Calcium 8.9  8.4 - 10.5 mg/dL   Total Protein  7.0  6.0 - 8.3 g/dL   Albumin 3.2 (*) 3.5 - 5.2 g/dL   AST 27  0 - 37 U/L   ALT 28  0 - 35 U/L   Alkaline Phosphatase 103  39 - 117 U/L   Total Bilirubin 0.2 (*)  0.3 - 1.2 mg/dL   GFR calc non Af Amer >90  >90 mL/min   GFR calc Af Amer >90  >90 mL/min  PRO B NATRIURETIC PEPTIDE      Result Value Ref Range   Pro B Natriuretic peptide (BNP) 642.6 (*) 0 - 125 pg/mL  TROPONIN I      Result Value Ref Range   Troponin I <0.30  <0.30 ng/mL   Imaging Review Dg Chest 2 View  10/22/2013   CLINICAL DATA:  Shortness of breath and wheezing  EXAM: CHEST  2 VIEW  COMPARISON:  None.  FINDINGS: Bilateral interstitial and airspace abnormality. There is asymmetry, especially of the airspace opacification which is asymmetrically increased in the peripheral left mid lung. Fissural thickening present. No significant pleural effusion. Cardiomegaly and vascular pedicle widening. No pneumothorax. No acute osseous findings.  IMPRESSION: Bilateral pulmonary abnormality which could represent pulmonary edema or multi focal infection.   Electronically Signed   By: Jorje Guild M.D.   On: 10/22/2013 05:26   Images viewed by me.   EKG Interpretation   Date/Time:  Monday October 22 2013 04:17:58 EST Ventricular Rate:  122 PR Interval:  146 QRS Duration: 80 QT Interval:  316 QTC Calculation: 450 R Axis:   27 Text Interpretation:  Sinus tachycardia Possible Left atrial enlargement  Nonspecific ST abnormality Abnormal ECG No previous ECGs available  Confirmed by [facility]  MD, Kala Gassmann ([id number]) on 10/22/2013 4:45:35 AM      MDM   Final diagnoses:  Dyspnea  Wheezing  CHF (congestive heart failure)    Wheezing which is probably part of a respiratory infection and bronchitis. Chest x-ray or be obtained to rule out pneumonia. However, with peripheral edema present, BNP will be checked to evaluate for possible congestive heart failure. She has been given an albuterol with ipratropium nebulizer treatment with significant subjective improvement. This will be repeated.  Following a second nebulizer treatment, there is further improvement in airflow and some high-pitched wheezes were  present. She was given a third nebulizer treatment with no further improvement. She still was feeling somewhat dyspneic and had some tightness in her chest. I reviewed chest x-ray and it looks most like pulmonary edema. BNP is only modestly elevated, but I still feel that she has a significant component of fluid overload have in her symptoms. She's given a dose of furosemide intravenously. Case is discussed with Dr. [place] Pink of [facility] hospitalists who agrees to admit the patient.  Delora Fuel, MD [phone]

## 2013-10-22 NOTE — Progress Notes (Signed)
Patient's Troponin was 0.80 ,Dr Sarajane Jews notified.Will continue to monitor patient.No c/o pain or discomfort noted.

## 2013-10-22 NOTE — Progress Notes (Signed)
Patient declined Influenza,and Pneumonia vaccine,educational handout offered,patient refused.

## 2013-10-22 NOTE — Consult Note (Signed)
Consulting cardiologist: Dr. Satira Sark  Clinical Summary Bridget Little is a 53 y.o.female admitted to the hospital for further evaluation of shortness of breath associated with wheezing, orthopnea, also leg swelling. She tells me that over the last 2-3 months she has been noticeably more short of breath with activity, has had mild leg edema, but uses TED hose and usually this has not been a problem. Within the last 3 weeks her leg edema has been worse. Symptoms prompting admission included the fairly sudden onset of significant shortness of breath at rest associated with cough and wheezing. Her husband has had an upper respiratory tract infection recently.  She otherwise denies any chest pain or palpitations.  She feels better this afternoon on examination, has been treated with pulmonary inhalers, antibiotics, and also IV Lasix. Chest x-ray is concerning for pulmonary edema, although cannot exclude infectious process. She has been afebrile, white blood cell count is 11.7. Pro-BNP 642. Troponin I has increased from less than 0.30 up to 0.80. ECG shows sinus tachycardia with no acute ST segment changes.  Family history reviewed, no premature CAD or cardiomyopathy.   Allergies  Allergen Reactions  . Bee Venom Anaphylaxis and Hives    Medications Scheduled Medications: . aspirin EC  81 mg Oral Daily  . enoxaparin (LOVENOX) injection  150 mg Subcutaneous Q12H  . furosemide  60 mg Intravenous Q12H  . insulin aspart  0-20 Units Subcutaneous TID WC  . insulin aspart  0-5 Units Subcutaneous QHS  . levalbuterol  0.63 mg Nebulization Q4H  . levofloxacin (LEVAQUIN) IV  750 mg Intravenous Q24H  . methylPREDNISolone (SOLU-MEDROL) injection  60 mg Intravenous Q6H  . sodium chloride  3 mL Intravenous Q12H  . sodium chloride  3 mL Intravenous Q12H     PRN Medications: sodium chloride, acetaminophen, acetaminophen, hydrALAZINE, levalbuterol, ondansetron (ZOFRAN) IV, ondansetron, oxyCODONE,  sodium chloride   Past Medical History  Diagnosis Date  . Essential hypertension, benign   . Hypercholesteremia   . Chronic back pain   . Neuromuscular disorder     Numbness in right hand and left foot    Past Surgical History  Procedure Laterality Date  . Tubal ligation    . Colonoscopy  06/02/2012    Procedure: COLONOSCOPY;  Surgeon: Danie Binder, MD;  [facility]: [facility];  Service: Endoscopy;  Laterality: N/A;  8:30 AM  . Back surgery      Family History  Problem Relation Age of Onset  . Colon cancer Neg Hx   . Prostate cancer Father     Social History Bridget Little reports that she has never smoked. She does not have any smokeless tobacco history on file. Bridget Little reports that she drinks alcohol.  Review of Systems She reports having a lumbar fusion several months ago, did rehabilitation. Still has trouble with leg numbness, presumably neuropathy. Otherwise as outlined above.  Physical Examination Blood pressure 136/83, pulse 113, temperature 98.2 F (36.8 C), temperature source Oral, resp. rate 18, height 5\' 4"  (1.626 m), weight 317 lb 0.3 oz (143.8 kg), SpO2 95.00%. No intake or output data in the 24 hours ending 10/22/13 1434  Telemetry: Sinus tachycardia, rare PVC.  Morbidly obese woman, no acute distress. HEENT: Conjunctiva and lids normal, oropharynx clear. Neck: Supple, increased girth, difficult to assess JVP, no carotid bruits, no thyromegaly. Lungs: Scattered crackles and rhonchi, nonlabored breathing at rest. Cardiac: Tachycardic regular rate, distant, no distinct S3 or significant systolic murmur, no pericardial rub. Abdomen: Soft,  nontender, protuberant, bowel sounds present, Extremities: 1-2+ edema, distal pulses 2+. Skin: Warm and dry. Musculoskeletal: No kyphosis. Neuropsychiatric: Alert and oriented x3, affect grossly appropriate.   Lab Results  Basic Metabolic Panel:  Recent Labs Lab 10/22/13 0424  NA 143  K 4.0  CL 105  CO2 27    GLUCOSE 180*  BUN 24*  CREATININE 0.74  CALCIUM 8.9    Liver Function Tests:  Recent Labs Lab 10/22/13 0424  AST 27  ALT 28  ALKPHOS 103  BILITOT 0.2*  PROT 7.0  ALBUMIN 3.2*    CBC:  Recent Labs Lab 10/22/13 0424  WBC 11.7*  NEUTROABS 8.8*  HGB 12.9  HCT 41.5  MCV 81.5  PLT 378    Cardiac Enzymes:  Recent Labs Lab 10/22/13 0424 10/22/13 1045  TROPONINI <0.30 0.80*    Pro-BNP: 642  ECG Sinus tachycardia, left atrial enlargement, nonspecific ST changes.  Imaging CHEST 2 VIEW  COMPARISON: None.  FINDINGS: Bilateral interstitial and airspace abnormality. There is asymmetry, especially of the airspace opacification which is asymmetrically increased in the peripheral left mid lung. Fissural thickening present. No significant pleural effusion. Cardiomegaly and vascular pedicle widening. No pneumothorax. No acute osseous findings.  IMPRESSION: Bilateral pulmonary abnormality which could represent pulmonary edema or multi focal infection.   Impression  1. Presentation with progressive exertional shortness of breath culminating with significant breathlessness at rest, recent wheezing and cough as well. No chest pain symptoms. She has had progressive leg edema over the last several weeks. Currently being treated for possible URI, husband has been sick with URI. Also concern for acute heart failure, pulmonary edema pattern by chest x-ray, troponin I is mildly increased to 0.80 in the absence of chest pain. ECG shows no acute ST segment changes.  2. History of hypertension.  3. Morbid obesity.. Would still consider CHF although pro-BNP is not significantly elevated.   Recommendations  Discussed with patient and husband. Echocardiogram for cardiac structural assessment and to help guide further evaluation and management. Current medications include aspirin, Lovenox, and IV Lasix. We will follow with further recommendations.  Satira Sark, M.D.,  F.A.C.C.

## 2013-10-22 NOTE — ED Notes (Addendum)
Respiratory therapy paged for second neb treatment.

## 2013-10-22 NOTE — ED Notes (Signed)
Patient placed on 2 liters of oxygen via nasal canula, oxygen saturation up to 93%.

## 2013-10-22 NOTE — Progress Notes (Signed)
PROGRESS NOTE  Bridget Little WUJ:[id number] DOB: [birth year] DOA: 10/22/2013 PCP: Chiquita Loth, MD  Summary: 53 year old woman with no history of cardiac disease known presented with sudden onset for shortness of breath and wheezing and worsening orthopnea. Also history of leg swelling last 3-4 weeks. Husband with recent upper respiratory illness. Noted to be significantly tachycardic and tachypneic on admission with diffuse expiratory wheezes reportedly improved with nebulizer therapy. Admitted for shortness of breath, pulmonary infection versus edema.  Assessment/Plan: 1. Acute hypoxic respiratory failure with tachycardia and tachypnea. May be simple bronchitis given her history of sick contact but elevated troponin, BNP, CXR findings and acute on chronic lower extremity edema worrisome for heart failure. Cannot exclude ACS. Never has had any chest pain currently feels much better.  2. Suspected acute bronchitis versus developing pneumonia. 3. Elevated troponin. May be related to heart strain versus ACS, although she has no symptoms of such at this point.  4. Diabetes mellitus type 2. Hold metformin. SSI.    Continue oxygen, nebulizer therapy, antibiotics.  Obtain 2-D echocardiogram, trend troponins--continue ASA. Empiric Lovenox pending further studies, cardiology consultation.  Check d-dimer but VTE not likely explanation (Well's score 1.5, low risk)  Code Status: full code DVT prophylaxis: Lovenox Family Communication: none present Disposition Plan: home when improved  Murray Hodgkins, MD  [facility]  Pager [phone] If 7PM-7AM, please contact night-coverage at [email or web address], password [facility] 10/22/2013, 11:51 AM  LOS: 0 days   Consultants:  Radiology  Procedures:  2-D echocardiogram pending  Antibiotics:  Levaquin 3/2 >>   HPI/Subjective: Still coughing and wheezing but feels better, breathing better. Never had any chest pain, nausea, vomiting, sweats. No  history of recent travel, no history of blood clots. Swelling is long-standing. No history of bleeding.  Objective: Filed Vitals:   10/22/13 0528 10/22/13 0646 10/22/13 1004 10/22/13 1131  BP:  144/69 154/92   Pulse:  130 116   Temp:   98.4 F (36.9 C)   TempSrc:   Oral   Resp:  18 18   Height:   5\' 4"  (1.626 m)   Weight:   143.8 kg (317 lb 0.3 oz)   SpO2: 95% 92% 93% 96%   No intake or output data in the 24 hours ending 10/22/13 1151   Filed Weights   10/22/13 0348 10/22/13 1004  Weight: 140.615 kg (310 lb) 143.8 kg (317 lb 0.3 oz)    Exam:   Most recent heart rate 116, normotensive, minimal hypoxia.  Gen. Appears to be comfortable. Nontoxic. Calm.  Cardiovascular. Regular rate and rhythm. No murmur, rub or gallop. 2+ bilateral lower extremity edema.  Respiratory coarse breath sounds no rhonchi or rales. Fair movement. Mild increased respiratory effort. Able to speak in full senses.  Abdomen soft.  Musculoskeletal appears grossly normal.  Data Reviewed: Complete metabolic panel essentially unremarkable. BNP 642, initial troponin normal, repeat troponin 0.8. CBC unremarkable. Chest x-ray suggested pulmonary edema or multifocal infection. EKG on admission showed sinus tachycardia with no acute changes.  Scheduled Meds: . aspirin EC  81 mg Oral Daily  . enoxaparin (LOVENOX) injection  70 mg Subcutaneous Q24H  . furosemide  60 mg Intravenous Q12H  . insulin aspart  0-20 Units Subcutaneous TID WC  . insulin aspart  0-5 Units Subcutaneous QHS  . levalbuterol  0.63 mg Nebulization Q4H  . levofloxacin (LEVAQUIN) IV  750 mg Intravenous Q24H  . methylPREDNISolone (SOLU-MEDROL) injection  60 mg Intravenous Q6H  . sodium chloride  3 mL Intravenous Q12H  .  sodium chloride  3 mL Intravenous Q12H   Continuous Infusions:   Principal Problem:   Acute respiratory failure with hypoxia Active Problems:   Dyspnea   Pulmonary edema   Morbid obesity   DM type 2 (diabetes  mellitus, type 2)   HTN (hypertension), benign   Acute bronchitis   Elevated troponin   Time spent 25 minutes

## 2013-10-22 NOTE — Progress Notes (Signed)
Dr Sarajane Jews notified results of EKG.

## 2013-10-22 NOTE — H&P (Signed)
[facility] History and Physical  Bridget Little ENI:[id number] DOB: 1960-09-10 DOA: 10/22/2013   PCP: Dr. Marvel Plan with [facility]: She follows with a spine specialist in [facility]  Chief Complaint: Shortness of breath since yesterday afternoon  HPI: Bridget Little is a 53 y.o. female with a past with history of hypertension, diabetes, chronic back pain, who was in her usual state of health till yesterday afternoon when she started having shortness of breath with wheezing. This progressively became worse as the evening went by. When she laid down the breathing got worse suggestive of orthopnea. She went to lie on a recliner but by 2 AM this morning the breathing had gotten significantly worse and so she decided to come in to the hospital. She's had a dry cough. She denies any chest pain. No fever. No chills. Denies any nausea, vomiting. She has noticed the leg swelling for the last 3-4 weeks and has been using TED stockings with some improvement. Her husband was sick with upper respiratory tract infection over the last 5-6 days. She denies any urinary complaints. Had a normal bowel movement. She is receiving nebulizer treatments in the ED, with only partial improvement. She does get extremely short of breath with minimal exertion. Denies any history of heart disease.  Home Medications: Did not have an up-to-date home medication list. The list mentioned below is not current. Prior to Admission medications   Medication Sig Start Date End Date Taking? Authorizing Provider  aspirin 81 MG tablet Take 81 mg by mouth daily.    Historical Provider, MD  Calcium Carb-Cholecalciferol (CALCIUM + D3) 600-200 MG-UNIT TABS Take 1 tablet by mouth 2 (two) times daily.    Historical Provider, MD  diazepam (VALIUM) 5 MG tablet Take 5 mg by mouth every 6 (six) hours as needed. She said she seldom takes this    Historical Provider, MD  furosemide (LASIX) 20 MG tablet Take  20 mg by mouth daily as needed. fluid    Historical Provider, MD  gabapentin (NEURONTIN) 300 MG capsule Take 300 mg by mouth 3 (three) times daily.    Historical Provider, MD  HYDROcodone-acetaminophen (NORCO) 7.5-325 MG per tablet Take 1 tablet by mouth every 4 (four) hours as needed. pain    Historical Provider, MD  lisinopril-hydrochlorothiazide (PRINZIDE,ZESTORETIC) 20-25 MG per tablet Take 1 tablet by mouth daily.    Historical Provider, MD  medroxyPROGESTERone (PROVERA) 10 MG tablet Take 10 mg by mouth daily.    Historical Provider, MD  metaxalone (SKELAXIN) 800 MG tablet Take 800 mg by mouth 3 (three) times daily as needed. Said she seldom takes this    Management consultant, MD  NON FORMULARY Epi pen as needed for bee stings    Historical Provider, MD  oxyCODONE-acetaminophen (PERCOCET) 10-325 MG per tablet Take 1 tablet by mouth every 8 (eight) hours as needed. She takes this instead of her Hydrocodone if her pain is severe. She does not take this and Hydrocodone together.    Historical Provider, MD    Allergies: No Known Allergies  Past Medical History: Past Medical History  Diagnosis Date  . Hypertension   . Hypercholesteremia   . Numbness and tingling in right hand   . Chronic back pain   . Neuromuscular disorder     numbness in right hand and left foot    Past Surgical History  Procedure Laterality Date  . Tubal ligation    . Colonoscopy  06/02/2012    Procedure: COLONOSCOPY;  Surgeon: Carlyon Prows  Rexene Edison, MD;  Location: [facility];  Service: Endoscopy;  Laterality: N/A;  8:30 AM  . Back surgery      Social History: She lives with her husband. She works part-time as a [employer]arry manager. No smoking. Occasional alcohol use. Denies any illicit drug use. Independent with daily activities.  Family History:  Family History  Problem Relation Age of Onset  . Colon cancer Neg Hx   . Prostate cancer Father      Review of Systems - History obtained from the patient General ROS: positive for   - fatigue Psychological ROS: negative Ophthalmic ROS: negative ENT ROS: negative Allergy and Immunology ROS: negative Hematological and Lymphatic ROS: negative Endocrine ROS: negative Respiratory ROS: as in hpi Cardiovascular ROS: as in hpi Gastrointestinal ROS: negative Genito-Urinary ROS: no dysuria, trouble voiding, or hematuria Musculoskeletal ROS: negative Neurological ROS: no TIA or stroke symptoms Dermatological ROS: negative  Physical Examination  Filed Vitals:   10/22/13 0348 10/22/13 0351 10/22/13 0428 10/22/13 0528  BP: 162/87     Pulse: 131     Temp: 98.7 F (37.1 C)     TempSrc: Oral     Resp: 40     Height: _0  (1.626 m)     Weight: 140.615 kg (310 lb)     SpO2: 89% 93% 94% 95%    BP 162/87  Pulse 131  Temp(Src) 98.7 F (37.1 C) (Oral)  Resp 40  Ht _1  (1.626 m)  Wt 140.615 kg (310 lb)  BMI 53.19 kg/m2  SpO2 95%  General appearance: alert, cooperative, appears stated age, no distress and morbidly obese Head: Normocephalic, without obvious abnormality, atraumatic Eyes: conjunctivae/corneas clear. PERRL, EOM's intact.  Throat: lips, mucosa, and tongue normal; teeth and gums normal Neck: no adenopathy, no carotid bruit, no JVD, supple, symmetrical, trachea midline and thyroid not enlarged, symmetric, no tenderness/mass/nodules Resp:  wheezing bilaterally, with crackles about one third of the way up the lung fields bilaterally. Cardio: S1-S2 is tachycardic. Regular. No S3, S4. No rubs, murmurs, or bruit. 1-2+ pitting edema bilateral lower extremities. GI: soft, non-tender; bowel sounds normal; no masses,  no organomegaly Extremities: 1-2+ pitting edema bilateral lower extremities. Pulses: 2+ and symmetric Skin: Skin color, texture, turgor normal. No rashes or lesions Lymph nodes: Cervical, supraclavicular, and axillary nodes normal. Neurologic: She is alert and oriented x3. No focal neurological deficits are present.  Laboratory Data: Results for  orders placed during the hospital encounter of 10/22/13 (from the past 48 hour(s))  CBC WITH DIFFERENTIAL     Status: Abnormal   Collection Time    10/22/13  4:24 AM      Result Value Ref Range   WBC 11.7 (*) 4.0 - 10.5 K/uL   RBC 5.09  3.87 - 5.11 MIL/uL   Hemoglobin 12.9  12.0 - 15.0 g/dL   HCT 41.5  36.0 - 46.0 %   MCV 81.5  78.0 - 100.0 fL   MCH 25.3 (*) 26.0 - 34.0 pg   MCHC 31.1  30.0 - 36.0 g/dL   RDW 19.2 (*) 11.5 - 15.5 %   Platelets 378  150 - 400 K/uL   Neutrophils Relative % 75  43 - 77 %   Neutro Abs 8.8 (*) 1.7 - 7.7 K/uL   Lymphocytes Relative 14  12 - 46 %   Lymphs Abs 1.6  0.7 - 4.0 K/uL   Monocytes Relative 9  3 - 12 %   Monocytes Absolute 1.1 (*) 0.1 - 1.0 K/uL  Eosinophils Relative 1  0 - 5 %   Eosinophils Absolute 0.1  0.0 - 0.7 K/uL   Basophils Relative 0  0 - 1 %   Basophils Absolute 0.0  0.0 - 0.1 K/uL  COMPREHENSIVE METABOLIC PANEL     Status: Abnormal   Collection Time    10/22/13  4:24 AM      Result Value Ref Range   Sodium 143  137 - 147 mEq/L   Potassium 4.0  3.7 - 5.3 mEq/L   Chloride 105  96 - 112 mEq/L   CO2 27  19 - 32 mEq/L   Glucose, Bld 180 (*) 70 - 99 mg/dL   BUN 24 (*) 6 - 23 mg/dL   Creatinine, Ser 0.74  0.50 - 1.10 mg/dL   Calcium 8.9  8.4 - 10.5 mg/dL   Total Protein 7.0  6.0 - 8.3 g/dL   Albumin 3.2 (*) 3.5 - 5.2 g/dL   AST 27  0 - 37 U/L   ALT 28  0 - 35 U/L   Alkaline Phosphatase 103  39 - 117 U/L   Total Bilirubin 0.2 (*) 0.3 - 1.2 mg/dL   GFR calc non Af Amer >90  >90 mL/min   GFR calc Af Amer >90  >90 mL/min   Comment: (NOTE)     The eGFR has been calculated using the CKD EPI equation.     This calculation has not been validated in all clinical situations.     eGFR's persistently <90 mL/min signify possible Chronic Kidney     Disease.  PRO B NATRIURETIC PEPTIDE     Status: Abnormal   Collection Time    10/22/13  4:24 AM      Result Value Ref Range   Pro B Natriuretic peptide (BNP) 642.6 (*) 0 - 125 pg/mL  TROPONIN  I     Status: None   Collection Time    10/22/13  4:24 AM      Result Value Ref Range   Troponin I <0.30  <0.30 ng/mL   Comment:            Due to the release kinetics of cTnI,     a negative result within the first hours     of the onset of symptoms does not rule out     myocardial infarction with certainty.     If myocardial infarction is still suspected,     repeat the test at appropriate intervals.    Radiology Reports: Dg Chest 2 View  10/22/2013   CLINICAL DATA:  Shortness of breath and wheezing  EXAM: CHEST  2 VIEW  COMPARISON:  None.  FINDINGS: Bilateral interstitial and airspace abnormality. There is asymmetry, especially of the airspace opacification which is asymmetrically increased in the peripheral left mid lung. Fissural thickening present. No significant pleural effusion. Cardiomegaly and vascular pedicle widening. No pneumothorax. No acute osseous findings.  IMPRESSION: Bilateral pulmonary abnormality which could represent pulmonary edema or multi focal infection.   Electronically Signed   By: Jorje Guild M.D.   On: 10/22/2013 05:26    Electrocardiogram: Sinus tachycardia 122 beats per minute. Normal axis. Intervals appear to be normal. No Q waves. Nonspecific T wave, changes. No concerning ST changes.  Problem List  Principal Problem:   Dyspnea Active Problems:   Pulmonary edema   Morbid obesity   DM type 2 (diabetes mellitus, type 2)   HTN (hypertension), benign   Assessment: This is a 53 year old, morbidly obese, African American  female, who presents with dyspnea since yesterday afternoon. It was sudden onset for the most part and has been progressively getting worse. Chest x-ray suggests pulmonary edema versus infection. She has only, a minimally elevated WBC, and she is afebrile. She does have significant pedal edema. All of this points more towards pulmonary edema rather than infection.  Plan: #1 dyspnea: Pulmonary edema versus infection and bronchitis:  Continue IV Lasix. For now we will continue with antibiotics and steroids. We will get an echocardiogram. See her response to Lasix and, depending on the results of the echocardiogram further management decisions can be made. Imaging studies can be repeated as well. Strict ins and outs and daily weights. Troponins will be checked. Oxygen will be provided. Nebulizer treatment as needed. Aspirin. Check lipid panel.  #2 diabetes mellitus, type II: Check HbA1c. Place on a sliding scale coverage.  #3 history of hypertension: Monitor blood pressure closely. Hydralazine as needed.  #4 morbid obesity: She will require weight loss counseling  Home medication list needs to be reconciled by pharmacy. Above list list is not current.  DVT Prophylaxis: Lovenox. This will be adjusted for weight Code Status: Full code Family Communication: Discussed with the patient and her husband  Disposition Plan: Admit to telemetry   Further management decisions will depend on results of further testing and patient's response to treatment.  [facility]  [facility] Pager [phone]  If 7PM-7AM, please contact night-coverage [email or web address] Password [facility]  10/22/2013, 6:37 AM

## 2013-10-22 NOTE — Progress Notes (Signed)
ANTICOAGULATION CONSULT NOTE - Initial Consult  Pharmacy Consult for Lovenox Indication: chest pain/ACS  Allergies  Allergen Reactions  . Bee Venom Anaphylaxis and Hives   Patient Measurements: Height: 5\' 4"  (162.6 cm) Weight: 317 lb 0.3 oz (143.8 kg) IBW/kg (Calculated) : 54.7  Vital Signs: Temp: 98.4 F (36.9 C) (03/02 1004) Temp src: Oral (03/02 1004) BP: 154/92 mmHg (03/02 1004) Pulse Rate: 116 (03/02 1004)  Labs:  Recent Labs  10/22/13 0424 10/22/13 1045  HGB 12.9  --   HCT 41.5  --   PLT 378  --   CREATININE 0.74  --   TROPONINI <0.30 0.80*   Estimated Creatinine Clearance: 115.9 ml/min (by C-G formula based on Cr of 0.74).  Medical History: Past Medical History  Diagnosis Date  . Hypertension   . Hypercholesteremia   . Numbness and tingling in right hand   . Chronic back pain   . Neuromuscular disorder     numbness in right hand and left foot   Medications:  Scheduled:  . aspirin EC  81 mg Oral Daily  . enoxaparin (LOVENOX) injection  150 mg Subcutaneous Q12H  . furosemide  60 mg Intravenous Q12H  . insulin aspart  0-20 Units Subcutaneous TID WC  . insulin aspart  0-5 Units Subcutaneous QHS  . levalbuterol  0.63 mg Nebulization Q4H  . levofloxacin (LEVAQUIN) IV  750 mg Intravenous Q24H  . methylPREDNISolone (SOLU-MEDROL) injection  60 mg Intravenous Q6H  . sodium chloride  3 mL Intravenous Q12H  . sodium chloride  3 mL Intravenous Q12H    Assessment: 53yo morbidly obese female with elevated troponin.  Pt has good renal fxn.  Estimated Creatinine Clearance: 115.9 ml/min (by C-G formula based on Cr of 0.74). Asked to start Lovenox for ACS pending further studies.  Goal of Therapy:  Anti-Xa level 0.6-1 units/ml 4hrs after LMWH dose given Monitor platelets by anticoagulation protocol: Yes   Plan:  Lovenox 1mg /Kg SQ q12hrs Monitor labs and renal fxn  Hart Robinsons A 10/22/2013,12:22 PM

## 2013-10-22 NOTE — Progress Notes (Signed)
Pharmacy Consult: Lovenox for VTE prophylaxis.  Patient Measurements: Height: 5\' 4"  (162.6 cm) Weight: 317 lb 0.3 oz (143.8 kg) IBW/kg (Calculated) : 54.7 Body mass index is 54.39 kg/(m^2).  VITALS Filed Vitals:   10/22/13 1004  BP: 154/92  Pulse: 116  Temp: 98.4 F (36.9 C)  Resp: 18   INR Last Three Days: No results found for this basename: INR,  in the last 72 hours  CBC:    Component Value Date/Time   WBC 11.7* 10/22/2013 0424   RBC 5.09 10/22/2013 0424   HGB 12.9 10/22/2013 0424   HCT 41.5 10/22/2013 0424   PLT 378 10/22/2013 0424   MCV 81.5 10/22/2013 0424   MCH 25.3* 10/22/2013 0424   MCHC 31.1 10/22/2013 0424   RDW 19.2* 10/22/2013 0424   LYMPHSABS 1.6 10/22/2013 0424   MONOABS 1.1* 10/22/2013 0424   EOSABS 0.1 10/22/2013 0424   BASOSABS 0.0 10/22/2013 0424   RENAL FUNCTION: Estimated Creatinine Clearance: 115.9 ml/min (by C-G formula based on Cr of 0.74).  Assessment: 53yo morbidly obese female with good renal fxn.    Plan: Lovenox 0.5mg /Kg SQ q24hrs (adjusted for weight)  Ena Dawley, [facility] 10/22/2013 10:50 AM

## 2013-10-22 NOTE — Progress Notes (Signed)
ANTIBIOTIC CONSULT NOTE - INITIAL  Pharmacy Consult for Levaquin Indication: pneumonia  Allergies  Allergen Reactions  . Bee Venom Anaphylaxis and Hives    Patient Measurements: Height: 5\' 4"  (162.6 cm) Weight: 317 lb 0.3 oz (143.8 kg) IBW/kg (Calculated) : 54.7  Vital Signs: Temp: 98.4 F (36.9 C) (03/02 1004) Temp src: Oral (03/02 1004) BP: 154/92 mmHg (03/02 1004) Pulse Rate: 116 (03/02 1004) Intake/Output from previous day:   Intake/Output from this shift:    Labs:  Recent Labs  10/22/13 0424  WBC 11.7*  HGB 12.9  PLT 378  CREATININE 0.74   Estimated Creatinine Clearance: 115.9 ml/min (by C-G formula based on Cr of 0.74). No results found for this basename: VANCOTROUGH, VANCOPEAK, VANCORANDOM, GENTTROUGH, GENTPEAK, GENTRANDOM, TOBRATROUGH, TOBRAPEAK, TOBRARND, AMIKACINPEAK, AMIKACINTROU, AMIKACIN,  in the last 72 hours   Microbiology: No results found for this or any previous visit (from the past 720 hour(s)).  Medical History: Past Medical History  Diagnosis Date  . Hypertension   . Hypercholesteremia   . Numbness and tingling in right hand   . Chronic back pain   . Neuromuscular disorder     numbness in right hand and left foot   Medications:  Scheduled:  . aspirin EC  81 mg Oral Daily  . enoxaparin (LOVENOX) injection  70 mg Subcutaneous Q24H  . furosemide  60 mg Intravenous Q12H  . insulin aspart  0-20 Units Subcutaneous TID WC  . insulin aspart  0-5 Units Subcutaneous QHS  . levalbuterol  0.63 mg Nebulization Q4H  . levofloxacin (LEVAQUIN) IV  750 mg Intravenous Q24H  . methylPREDNISolone (SOLU-MEDROL) injection  60 mg Intravenous Q6H  . sodium chloride  3 mL Intravenous Q12H  . sodium chloride  3 mL Intravenous Q12H   Assessment: 53yo morbidly obese female admitted with SOB and wheezing.  Pt will be started on Levaquin for pna.  Good renal fxn.   Estimated Creatinine Clearance: 115.9 ml/min (by C-G formula based on Cr of 0.74).  Goal of  Therapy:  Eradicate infection.  Plan:  Levaquin 750mg  IV q24hrs Switch to PO when improved Monitor labs, progress, and cultures  [place] Crane, Xavion Muscat A 10/22/2013,10:44 AM

## 2013-10-22 NOTE — Care Management Note (Signed)
UR completed 

## 2013-10-23 DIAGNOSIS — I517 Cardiomegaly: Secondary | ICD-10-CM

## 2013-10-23 LAB — COMPREHENSIVE METABOLIC PANEL
ALK PHOS: 92 U/L (ref 39–117)
ALT: 21 U/L (ref 0–35)
AST: 16 U/L (ref 0–37)
Albumin: 3 g/dL — ABNORMAL LOW (ref 3.5–5.2)
BUN: 19 mg/dL (ref 6–23)
CALCIUM: 9.6 mg/dL (ref 8.4–10.5)
CO2: 30 mEq/L (ref 19–32)
Chloride: 103 mEq/L (ref 96–112)
Creatinine, Ser: 0.72 mg/dL (ref 0.50–1.10)
GLUCOSE: 237 mg/dL — AB (ref 70–99)
Potassium: 3.9 mEq/L (ref 3.7–5.3)
Sodium: 146 mEq/L (ref 137–147)
TOTAL PROTEIN: 6.8 g/dL (ref 6.0–8.3)
Total Bilirubin: 0.6 mg/dL (ref 0.3–1.2)

## 2013-10-23 LAB — GLUCOSE, CAPILLARY
Glucose-Capillary: 229 mg/dL — ABNORMAL HIGH (ref 70–99)
Glucose-Capillary: 276 mg/dL — ABNORMAL HIGH (ref 70–99)
Glucose-Capillary: 219 mg/dL — ABNORMAL HIGH (ref 70–99)
Glucose-Capillary: 202 mg/dL — ABNORMAL HIGH (ref 70–99)

## 2013-10-23 LAB — LIPID PANEL
Cholesterol: 192 mg/dL (ref 0–200)
HDL: 62 mg/dL (ref >39)
LDL CALC: 112 mg/dL — AB (ref 0–99)
TRIGLYCERIDES: 90 mg/dL (ref <150)
Total CHOL/HDL Ratio: 3.1 RATIO
VLDL: 18 mg/dL (ref 0–40)

## 2013-10-23 LAB — CBC
HCT: 40.3 % (ref 36.0–46.0)
Hemoglobin: 12.8 g/dL (ref 12.0–15.0)
MCH: 25.2 pg — ABNORMAL LOW (ref 26.0–34.0)
MCHC: 31.8 g/dL (ref 30.0–36.0)
MCV: 79.3 fL (ref 78.0–100.0)
PLATELETS: 359 10*3/uL (ref 150–400)
RBC: 5.08 MIL/uL (ref 3.87–5.11)
RDW: 18.6 % — ABNORMAL HIGH (ref 11.5–15.5)
WBC: 18.4 10*3/uL — ABNORMAL HIGH (ref 4.0–10.5)

## 2013-10-23 MED ORDER — CARVEDILOL 3.125 MG PO TABS
3.1250 mg | ORAL_TABLET | Freq: Two times a day (BID) | ORAL | Status: DC
  Administered 2013-10-23 – 2013-10-24 (×2): 3.125 mg via ORAL
  Filled 2013-10-23 (×4): qty 1

## 2013-10-23 NOTE — Progress Notes (Signed)
Consulting cardiologist: Dr. Satira Sark  Subjective:    States that she feels much better today. Still somewhat "congested" in her chest, but no chest discomfort  Objective:   Temp:  [97.5 F (36.4 C)-98.2 F (36.8 C)] 97.5 F (36.4 C) (03/03 0504) Pulse Rate:  [72-113] 72 (03/03 0504) Resp:  [18] 18 (03/03 0504) BP: (124-136)/(65-83) 124/65 mmHg (03/03 0504) SpO2:  [91 %-98 %] 91 % (03/03 0730) Weight:  [313 lb 4.4 oz (142.1 kg)] 313 lb 4.4 oz (142.1 kg) (03/03 0504) Last BM Date: 10/22/13  Filed Weights   10/22/13 0348 10/22/13 1004 10/23/13 0504  Weight: 310 lb (140.615 kg) 317 lb 0.3 oz (143.8 kg) 313 lb 4.4 oz (142.1 kg)    Intake/Output Summary (Last 24 hours) at 10/23/13 1059 Last data filed at 10/23/13 0839  Gross per 24 hour  Intake    920 ml  Output   2900 ml  Net  -1980 ml    Exam:  General: Appears comfortable at rest.  Lungs: Still with scattered rhonchi, prolonged expiratory phase.   Cardiac: RRR, no obvious gallop.  Extremities: Improved edema.   Lab Results:  Basic Metabolic Panel:  Recent Labs Lab 10/22/13 0424 10/23/13 0534  NA 143 146  K 4.0 3.9  CL 105 103  CO2 27 30  GLUCOSE 180* 237*  BUN 24* 19  CREATININE 0.74 0.72  CALCIUM 8.9 9.6    Liver Function Tests:  Recent Labs Lab 10/22/13 0424 10/23/13 0534  AST 27 16  ALT 28 21  ALKPHOS 103 92  BILITOT 0.2* 0.6  PROT 7.0 6.8  ALBUMIN 3.2* 3.0*    CBC:  Recent Labs Lab 10/22/13 0424 10/23/13 0534  WBC 11.7* 18.4*  HGB 12.9 12.8  HCT 41.5 40.3  MCV 81.5 79.3  PLT 378 359    Cardiac Enzymes:  Recent Labs Lab 10/22/13 1045 10/22/13 1636 10/22/13 2241  TROPONINI 0.80* 0.63* 0.48*    BNP:  Recent Labs  10/22/13 0424  PROBNP 642.6*    ECG: Most recent tracing shows sinus rhythm with specific anterolateral ST-T wave changes, ischemia not excluded.   Medications:   Scheduled Medications: . aspirin EC  81 mg Oral Daily  . enoxaparin  (LOVENOX) injection  150 mg Subcutaneous Q12H  . furosemide  60 mg Intravenous Q12H  . insulin aspart  0-20 Units Subcutaneous TID WC  . insulin aspart  0-5 Units Subcutaneous QHS  . levalbuterol  0.63 mg Nebulization Q4H  . levofloxacin (LEVAQUIN) IV  750 mg Intravenous Q24H  . methylPREDNISolone (SOLU-MEDROL) injection  60 mg Intravenous Q6H  . sodium chloride  3 mL Intravenous Q12H  . sodium chloride  3 mL Intravenous Q12H      PRN Medications:  sodium chloride, acetaminophen, acetaminophen, hydrALAZINE, levalbuterol, ondansetron (ZOFRAN) IV, ondansetron, oxyCODONE, sodium chloride   Assessment:   1. Presentation with progressive exertional shortness of breath culminating with significant breathlessness at rest, recent wheezing and cough as well. No chest pain symptoms. She has had progressive leg edema over the last several weeks. Currently being treated for possible URI, husband has been sick with URI. Also concern for acute heart failure, pulmonary edema pattern by chest x-ray, troponin I is mildly increased to 0.80 in the absence of chest pain. ECG shows no acute ST segment changes. She feels much better this morning. Echocardiogram is pending for assessment of cardiac structure and function.  2. History of hypertension.   3. Morbid obesity.. Would still consider CHF  although pro-BNP is not significantly elevated.  4. Leukocytosis, on steroids, afebrile.   Plan/Discussion:    Currently on aspirin, Lovenox, and Lasix. Will followup on echocardiogram and can make further recommendations from there.   Satira Sark, M.D., F.A.C.C.

## 2013-10-23 NOTE — Progress Notes (Signed)
     Followup from progress note earlier today. Patient continues to do better clinically. Echocardiogram reviewed, overall a very limited study, but indicates LV dysfunction with LVEF roughly estimated in the 25-30% range. Potential anteroseptal hypokinesis based on limited views, grade 1 diastolic dysfunction, also reduced RV contraction. I reviewed this with the hospitalist team and also the patient today. Based on her presentation and the preceding symptoms over the last several weeks with new diagnosis of cardiomyopathy, plan will be transfer to [facility] tomorrow in anticipation of a left and right heart catheterization. We can best define her coronary anatomy and hemodynamics, then determine strategy for management. In the meanwhile will continue aspirin, IV Lasix, add low-dose beta blocker as her volume status seems to be improving. No ACE inhibitor or ARB as yet pending angiography. This can be started later.  Satira Sark, M.D., F.A.C.C.

## 2013-10-23 NOTE — Progress Notes (Signed)
PROGRESS NOTE  Bridget Little ZYS:[id number] DOB: 11-29-1960 DOA: 10/22/2013 PCP: Chiquita Loth, MD  Summary: 53 year old woman with no h/o CAD, admitted 10/22/13 with a three-day subacute history severe shortness of breath, DOE-worsened by bending over, minimal activity + leg swelling last 3-4 weeks. Husband with ?URI 10/21/48  Noted to be significantly tachycardic and tachypneic on admission with diffuse expiratory wheezes reportedly improved with nebulizer therapy. Admitted for shortness of breath, pulmonary infection versus edema. Eventually echocardiogram performed 10/23/13 = EF 20-25% with hypokinesis  Assessment/Plan: 1. Acute hypoxic respiratory failure with tachycardia and tachypnea-DDx initially pulmonary viral URI but current thought=AEsystolicCHF-discussed personally with Dr. Domenic Polite cardiology = cardiac cath left and right plan to tentatively 10/24/13 a.m.  2. Severe systolic heart failure-continue IV Lasix 60 every 12, defer to cardiology regarding timing of re\re addition of Coreg 6.25 twice a day, lisinopril 40 daily.  Would discontinue nonsteroidal anti-inflammatories is can worsen heart failure 3. Suspected acute bronchitis-discontinued IV Levaquin 10/23/13.  Transitional IV Solu-Medrol >>prednisone 40 mg daily 4. Elevated troponin. Likely 2/2 heart strain-EKG does show inverted T waves 10/23/13 compared to 10/22/13. Patient completely chest pain-free however. Further workup per cardiology 5. History of spine surgery-currently stable 6. Diabetes mellitus type 2. Hold metformin. SSI coverage-blood sugars 10/14/1968    Code Status: full code DVT prophylaxis: Lovenox Family Communication: none present Disposition Plan: home when improved  [name], MD [facility] [phone]  10/23/2013, 3:31 PM  LOS: 1 day   Consultants:  Radiology  Cardiology  Procedures:  2-D echocardiogram 3/3 = EF 25%, severe hypokinesis  Antibiotics:  Levaquin 3/2 >>  3/3  HPI/Subjective:  Doing fair Shortness of breath much improved No cough currently, no fever, no sputum, and no chest pain, no blurred vision, no double vision, no weakness in any one side of body  Objective: Filed Vitals:   10/23/13 0504 10/23/13 0730 10/23/13 1152 10/23/13 1503  BP: 124/65   137/73  Pulse: 72   104  Temp: 97.5 F (36.4 C)   98 F (36.7 C)  TempSrc: Oral   Oral  Resp: 18   20  Height:      Weight: 142.1 kg (313 lb 4.4 oz)     SpO2: 98% 91% 91% 92%    Intake/Output Summary (Last 24 hours) at 10/23/13 1531 Last data filed at 10/23/13 1300  Gross per 24 hour  Intake    960 ml  Output   3700 ml  Net  -2740 ml     Filed Weights   10/22/13 0348 10/22/13 1004 10/23/13 0504  Weight: 140.615 kg (310 lb) 143.8 kg (317 lb 0.3 oz) 142.1 kg (313 lb 4.4 oz)    Exam:   Most recent heart rate 116, normotensive, minimal hypoxia.  Gen. Appears to be comfortable. Nontoxic. Calm.  Cardiovascular. Regular rate and rhythm. No murmur, rub or gallop. 2+ bilateral lower extremity edema.  Respiratory coarse breath sounds  Abdomen soft.  Musculoskeletal appears grossly normal.  Scheduled Meds: . aspirin EC  81 mg Oral Daily  . enoxaparin (LOVENOX) injection  150 mg Subcutaneous Q12H  . furosemide  60 mg Intravenous Q12H  . insulin aspart  0-20 Units Subcutaneous TID WC  . insulin aspart  0-5 Units Subcutaneous QHS  . levalbuterol  0.63 mg Nebulization Q4H  . sodium chloride  3 mL Intravenous Q12H  . sodium chloride  3 mL Intravenous Q12H   Continuous Infusions:   Principal Problem:   Acute respiratory failure with hypoxia Active Problems:  Dyspnea   Pulmonary edema   Morbid obesity   DM type 2 (diabetes mellitus, type 2)   HTN (hypertension), benign   Acute bronchitis   Elevated troponin   Time spent 25 minutes

## 2013-10-23 NOTE — Progress Notes (Signed)
Inpatient Diabetes Program Recommendations  AACE/ADA: New Consensus Statement on Inpatient Glycemic Control (2013)  Target Ranges:  Prepandial:   less than 140 mg/dL      Peak postprandial:   less than 180 mg/dL (1-2 hours)      Critically ill patients:  140 - 180 mg/dL   Results for Bridget Little, Bridget Little (MRN [id number]) as of 10/23/2013 10:50  Ref. Range 10/22/2013 11:47 10/22/2013 16:32 10/22/2013 20:50 10/23/2013 07:54  Glucose-Capillary Latest Range: 70-99 mg/dL 221 (H) 258 (H) 264 (H) 229 (H)   Diabetes history: DM2 Outpatient Diabetes medications: Metformin 1000 mg BID Current orders for Inpatient glycemic control: Novolog 0-20 units AC, Novolog 0-5 units HS  Inpatient Diabetes Program Recommendations Insulin - Basal: Please consider ordering Levemir 10 units Q24H (starting now). Noted patient is ordered Solumedrol 60 mg Q6H which is contributing to hyperglycemia.  Thanks, Barnie Alderman, RN, MSN, CCRN Diabetes Coordinator Inpatient Diabetes Program [phone] (Team Pager) [phone] (AP office)  [phone] [facility] office)

## 2013-10-23 NOTE — Progress Notes (Signed)
  Echocardiogram 2D Echocardiogram has been performed.  Philipp Deputy 10/23/2013, 12:58 PM

## 2013-10-24 ENCOUNTER — Encounter (HOSPITAL_COMMUNITY)
Admission: EM | Disposition: A | Discharge status: Home / Self Care | Payer: Self-pay | Source: Home / Self Care | Attending: Family Medicine

## 2013-10-24 ENCOUNTER — Ambulatory Visit (HOSPITAL_COMMUNITY): Admit: 2013-10-24 | Payer: Self-pay | Admitting: Interventional Cardiology

## 2013-10-24 DIAGNOSIS — I5021 Acute systolic (congestive) heart failure: Secondary | ICD-10-CM | POA: Diagnosis present

## 2013-10-24 DIAGNOSIS — R079 Chest pain, unspecified: Secondary | ICD-10-CM

## 2013-10-24 HISTORY — PX: LEFT AND RIGHT HEART CATHETERIZATION WITH CORONARY ANGIOGRAM: SHX5449

## 2013-10-24 LAB — CREATININE, SERUM
Creatinine, Ser: 0.83 mg/dL (ref 0.50–1.10)
GFR calc non Af Amer: 79 mL/min — ABNORMAL LOW (ref >90)

## 2013-10-24 LAB — CBC
HEMATOCRIT: 44.1 % (ref 36.0–46.0)
Hemoglobin: 14.7 g/dL (ref 12.0–15.0)
MCH: 26.2 pg (ref 26.0–34.0)
MCHC: 33.3 g/dL (ref 30.0–36.0)
MCV: 78.6 fL (ref 78.0–100.0)
Platelets: 381 10*3/uL (ref 150–400)
RBC: 5.61 MIL/uL — ABNORMAL HIGH (ref 3.87–5.11)
RDW: 18.6 % — ABNORMAL HIGH (ref 11.5–15.5)
WBC: 13.8 10*3/uL — AB (ref 4.0–10.5)

## 2013-10-24 LAB — BASIC METABOLIC PANEL
BUN: 30 mg/dL — ABNORMAL HIGH (ref 6–23)
CHLORIDE: 102 meq/L (ref 96–112)
CO2: 32 meq/L (ref 19–32)
CREATININE: 0.83 mg/dL (ref 0.50–1.10)
Calcium: 9.5 mg/dL (ref 8.4–10.5)
GFR calc Af Amer: >90 mL/min (ref >90)
GFR calc non Af Amer: 79 mL/min — ABNORMAL LOW (ref >90)
Glucose, Bld: 173 mg/dL — ABNORMAL HIGH (ref 70–99)
Potassium: 3.7 mEq/L (ref 3.7–5.3)
Sodium: 145 mEq/L (ref 137–147)

## 2013-10-24 LAB — GLUCOSE, CAPILLARY
GLUCOSE-CAPILLARY: 184 mg/dL — AB (ref 70–99)
GLUCOSE-CAPILLARY: 146 mg/dL — AB (ref 70–99)
Glucose-Capillary: 92 mg/dL (ref 70–99)
Glucose-Capillary: 232 mg/dL — ABNORMAL HIGH (ref 70–99)

## 2013-10-24 LAB — PROTIME-INR
INR: 0.99 (ref 0.00–1.49)
Prothrombin Time: 12.9 seconds (ref 11.6–15.2)

## 2013-10-24 SURGERY — LEFT AND RIGHT HEART CATHETERIZATION WITH CORONARY ANGIOGRAM
Anesthesia: LOCAL

## 2013-10-24 MED ORDER — BUSPIRONE HCL 15 MG PO TABS
15.0000 mg | ORAL_TABLET | Freq: Two times a day (BID) | ORAL | Status: DC
  Administered 2013-10-24 – 2013-10-28 (×8): 15 mg via ORAL
  Filled 2013-10-24 (×9): qty 1

## 2013-10-24 MED ORDER — LEVALBUTEROL HCL 0.63 MG/3ML IN NEBU: 0.6300 mg | INHALATION_SOLUTION | RESPIRATORY_TRACT | Status: DC | PRN

## 2013-10-24 MED ORDER — POLYVINYL ALCOHOL 1.4 % OP SOLN
2.0000 [drp] | OPHTHALMIC | Status: DC | PRN
  Filled 2013-10-24: qty 15

## 2013-10-24 MED ORDER — FENTANYL CITRATE 0.05 MG/ML IJ SOLN
INTRAMUSCULAR | Status: AC
  Filled 2013-10-24: qty 2

## 2013-10-24 MED ORDER — HEPARIN (PORCINE) IN NACL 2-0.9 UNIT/ML-% IJ SOLN
INTRAMUSCULAR | Status: AC
  Filled 2013-10-24: qty 1000

## 2013-10-24 MED ORDER — FUROSEMIDE 10 MG/ML IJ SOLN
INTRAMUSCULAR | Status: AC
  Filled 2013-10-24: qty 8

## 2013-10-24 MED ORDER — MIDAZOLAM HCL 2 MG/2ML IJ SOLN
INTRAMUSCULAR | Status: AC
  Filled 2013-10-24: qty 2

## 2013-10-24 MED ORDER — ENOXAPARIN SODIUM 150 MG/ML ~~LOC~~ SOLN
140.0000 mg | Freq: Two times a day (BID) | SUBCUTANEOUS | Status: DC
  Filled 2013-10-24: qty 1

## 2013-10-24 MED ORDER — OVER THE COUNTER MEDICATION: 1.0000 [drp] | Freq: Every day | Status: DC | PRN

## 2013-10-24 MED ORDER — LIDOCAINE HCL (PF) 1 % IJ SOLN
INTRAMUSCULAR | Status: AC
  Filled 2013-10-24: qty 30

## 2013-10-24 MED ORDER — ASPIRIN 81 MG PO CHEW: 81.0000 mg | CHEWABLE_TABLET | ORAL | Status: AC

## 2013-10-24 MED ORDER — SODIUM CHLORIDE 0.9 % IV SOLN
INTRAVENOUS | Status: AC
  Administered 2013-10-24: 19:00:00 via INTRAVENOUS

## 2013-10-24 MED ORDER — SODIUM CHLORIDE 0.9 % IV SOLN: 250.0000 mL | INTRAVENOUS | Status: DC | PRN

## 2013-10-24 MED ORDER — CARVEDILOL 6.25 MG PO TABS
6.2500 mg | ORAL_TABLET | Freq: Two times a day (BID) | ORAL | Status: DC
  Administered 2013-10-24 – 2013-10-25 (×2): 6.25 mg via ORAL
  Filled 2013-10-24 (×4): qty 1

## 2013-10-24 MED ORDER — DIAZEPAM 5 MG PO TABS
5.0000 mg | ORAL_TABLET | ORAL | Status: AC
  Administered 2013-10-24: 5 mg via ORAL
  Filled 2013-10-24: qty 1

## 2013-10-24 MED ORDER — SODIUM CHLORIDE 0.9 % IJ SOLN: 3.0000 mL | INTRAMUSCULAR | Status: DC | PRN

## 2013-10-24 MED ORDER — NITROGLYCERIN 0.2 MG/ML ON CALL CATH LAB
INTRAVENOUS | Status: AC
  Filled 2013-10-24: qty 1

## 2013-10-24 MED ORDER — LISINOPRIL 40 MG PO TABS
40.0000 mg | ORAL_TABLET | Freq: Every day | ORAL | Status: DC
  Administered 2013-10-24 – 2013-10-28 (×5): 40 mg via ORAL
  Filled 2013-10-24 (×5): qty 1

## 2013-10-24 MED ORDER — IBUPROFEN 200 MG PO TABS: 400.0000 mg | ORAL_TABLET | Freq: Every day | ORAL | Status: DC

## 2013-10-24 MED ORDER — SODIUM CHLORIDE 0.9 % IV SOLN: INTRAVENOUS | Status: DC

## 2013-10-24 MED ORDER — SODIUM CHLORIDE 0.9 % IJ SOLN: 3.0000 mL | Freq: Two times a day (BID) | INTRAMUSCULAR | Status: DC

## 2013-10-24 MED ORDER — ENOXAPARIN SODIUM 40 MG/0.4ML ~~LOC~~ SOLN
40.0000 mg | SUBCUTANEOUS | Status: DC
  Administered 2013-10-25 – 2013-10-27 (×3): 40 mg via SUBCUTANEOUS
  Filled 2013-10-24 (×5): qty 0.4

## 2013-10-24 MED ORDER — IBUPROFEN 200 MG PO TABS
200.0000 mg | ORAL_TABLET | Freq: Every day | ORAL | Status: DC
  Administered 2013-10-24: 200 mg via ORAL
  Filled 2013-10-24: qty 1

## 2013-10-24 MED ORDER — EPINEPHRINE 0.3 MG/0.3ML IJ SOAJ: 0.3000 mg | Freq: Once | INTRAMUSCULAR | Status: DC

## 2013-10-24 NOTE — Progress Notes (Signed)
During night shift, patient had a few episodes of being bradycardic ( falling into 30), would only stay there a few seconds and heart rate would increase.  Rounded on patient, patient asleep, easily awakened, alert and oriented and in no distress.  Cardiac monitoring messaged nurse that during night patient had 2 second pause.  notifed md on call, received no new orders.  Patient, alert, in no distress, vitals wnl.  Patient seen by cardiology and is planning on being transferred to  to receive cardiac catheterization.  Will continue to monitor patient.

## 2013-10-24 NOTE — Progress Notes (Signed)
Report called to [facility] at [place] to transport patient to [place].

## 2013-10-24 NOTE — H&P (View-Only) (Signed)
Consulting cardiologist: Dr. Satira Sark  Subjective:    No chest pain, breathing status still better with some congestion.  Objective:   Temp:  [97.2 F (36.2 C)-98 F (36.7 C)] 97.2 F (36.2 C) (03/04 0402) Pulse Rate:  [55-104] 88 (03/04 0402) Resp:  [20] 20 (03/04 0402) BP: (137-158)/(65-73) 158/72 mmHg (03/04 0402) SpO2:  [91 %-100 %] 99 % (03/04 0816) Weight:  [308 lb 13.8 oz (140.1 kg)] 308 lb 13.8 oz (140.1 kg) (03/04 0500) Last BM Date: 10/24/13  Filed Weights   10/22/13 1004 10/23/13 0504 10/24/13 0500  Weight: 317 lb 0.3 oz (143.8 kg) 313 lb 4.4 oz (142.1 kg) 308 lb 13.8 oz (140.1 kg)    Intake/Output Summary (Last 24 hours) at 10/24/13 1117 Last data filed at 10/24/13 0800  Gross per 24 hour  Intake    480 ml  Output   1300 ml  Net   -820 ml    Telemetry: Sinus rhythm.  Exam:  General: Appears comfortable at rest.   Lungs: Still with scattered rhonchi, prolonged expiratory phase.   Cardiac: RRR, no obvious gallop.   Extremities: Improved edema.   Lab Results:  Basic Metabolic Panel:  Recent Labs Lab 10/22/13 0424 10/23/13 0534 10/24/13 0548  NA 143 146 145  K 4.0 3.9 3.7  CL 105 103 102  CO2 27 30 32  GLUCOSE 180* 237* 173*  BUN 24* 19 30*  CREATININE 0.74 0.72 0.83  CALCIUM 8.9 9.6 9.5    Liver Function Tests:  Recent Labs Lab 10/22/13 0424 10/23/13 0534  AST 27 16  ALT 28 21  ALKPHOS 103 92  BILITOT 0.2* 0.6  PROT 7.0 6.8  ALBUMIN 3.2* 3.0*    CBC:  Recent Labs Lab 10/22/13 0424 10/23/13 0534  WBC 11.7* 18.4*  HGB 12.9 12.8  HCT 41.5 40.3  MCV 81.5 79.3  PLT 378 359    Cardiac Enzymes:  Recent Labs Lab 10/22/13 1045 10/22/13 1636 10/22/13 2241  TROPONINI 0.80* 0.63* 0.48*    BNP:  Recent Labs  10/22/13 0424  PROBNP 642.6*    Echocardiogram (3/3): Study Conclusions  - Left ventricle: The cavity size was normal. Wall thickness was increased in a pattern of mild LVH. Systolic  function was severely reduced. The estimated ejection fraction was in the range of 25% to 30%. Possible severe hypokinesis of the anteroseptal myocardium. Doppler parameters are consistent with abnormal left ventricular relaxation (grade 1 diastolic dysfunction). - Aortic valve: Poorly visualized. Mildly calcified annulus. Probably trileaflet. No significant regurgitation. - Right ventricle: Systolic function was reduced. - Right atrium: Central venous pressure: 65mm Hg (est). - Tricuspid valve: Physiologic regurgitation. - Pulmonary arteries: Systolic pressure could not be accurately estimated. - Pericardium, extracardiac: A prominent pericardial fat pad was present. Impressions:  - Extremely limited study. There is mild LVH, overall normal LV chamber size, LVEF roughly estimated at 25-30% based on available images. There appears to be significant anteroseptal hypokinesis based on some views. Optison contrast may provide better images. There is grade 1 diastolic dysfunction. RV contraction is abnormal as well. No significant degree of mitral or tricuspid regurgitation noted, cannotassess PASP. CVP appears normal. Epicardial fat pad noted.   Medications:   Scheduled Medications: . aspirin EC  81 mg Oral Daily  . carvedilol  3.125 mg Oral BID WC  . enoxaparin (LOVENOX) injection  150 mg Subcutaneous Q12H  . furosemide  60 mg Intravenous Q12H  . insulin aspart  0-20 Units Subcutaneous TID WC  .  insulin aspart  0-5 Units Subcutaneous QHS  . levalbuterol  0.63 mg Nebulization Q4H  . sodium chloride  3 mL Intravenous Q12H  . sodium chloride  3 mL Intravenous Q12H      PRN Medications:  sodium chloride, acetaminophen, acetaminophen, hydrALAZINE, levalbuterol, ondansetron (ZOFRAN) IV, ondansetron, oxyCODONE, sodium chloride   Assessment:   1. Presentation with progressive exertional shortness of breath culminating with significant breathlessness at rest, recent wheezing and  cough as well. No chest pain symptoms. She has had progressive leg edema over the last several weeks. Currently being treated for possible URI, husband has been sick with URI. Also diagnosed with cardiomyopathy based on extremely limited echocardiogram, LVEF roughly estimated in the range of 25-30%, possible more prominent hypokinesis of the anteroseptal wall. Grade 1 diastolic dysfunction also noted. Mild increase in troponin I as detailed above.  2. Possible bronchitis, empirically on antibiotics and steroids per the primary team. No definite infiltrate by chest x-ray.  3. Morbid obesity.  4. History of hypertension.  5. Leukocytosis, on steroids, afebrile.  6. Incidentally noted enlargement of the left thyroid lobe by recent chest CT. Will eventually require further workup, consider thyroid ultrasound.   Plan/Discussion:    Discussed with patient and husband, also hospitalist team. Plan is for transfer to [facility] today in anticipation of a left and right heart catheterization for further evaluation of her cardiomyopathy and to better tailor her medical regimen and treatment options.   Satira Sark, M.D., F.A.C.C.

## 2013-10-24 NOTE — Care Management Note (Signed)
    Page 1 of 1   10/24/2013     10:49:25 AM   CARE MANAGEMENT NOTE 10/24/2013  Patient:  Bridget Little,Bridget Little   Account Number:  [id number]  Date Initiated:  10/24/2013  Documentation initiated by:  Claretha Cooper  Subjective/Objective Assessment:   Pt admitted from home where she lives with her spouse.     Action/Plan:   Transferring to [facility] for heart cath   Anticipated DC Date:  10/24/2013   Anticipated DC Plan:        DC Planning Services  CM consult      Choice offered to / List presented to:             Status of service:  Completed, signed off Medicare Important Message given?   (If response is "NO", the following Medicare IM given date fields will be blank) Date Medicare IM given:   Date Additional Medicare IM given:    Discharge Disposition:  ACUTE TO ACUTE TRANS  Per UR Regulation:    If discussed at Long Length of Stay Meetings, dates discussed:    Comments:  10/24/13 Claretha Cooper RN BSN CM

## 2013-10-24 NOTE — Interval H&P Note (Signed)
Cath Lab Visit (complete for each Cath Lab visit)  Clinical Evaluation Leading to the Procedure:   ACS: yes  Non-ACS:    Anginal Classification: CCS IV  Anti-ischemic medical therapy: Minimal Therapy (1 class of medications)  Non-Invasive Test Results: No non-invasive testing performed  Prior CABG: No previous CABG      History and Physical Interval Note:  10/24/2013 4:46 PM  Bridget Little  has presented today for surgery, with the diagnosis of cp  The various methods of treatment have been discussed with the patient and family. After consideration of risks, benefits and other options for treatment, the patient has consented to  Procedure(s): LEFT AND RIGHT HEART CATHETERIZATION WITH CORONARY ANGIOGRAM (N/A) as a surgical intervention .  The patient's history has been reviewed, patient examined, no change in status, stable for surgery.  I have reviewed the patient's chart and labs.  Questions were answered to the patient's satisfaction.     Kathlyn Sacramento

## 2013-10-24 NOTE — Progress Notes (Signed)
PROGRESS NOTE  Kyla Duffy RKY:[id number] DOB: 1960/09/07 DOA: 10/22/2013 PCP: Chiquita Loth, MD  Summary: 53 year old woman with no history of cardiac disease known presented with sudden onset for shortness of breath and wheezing and worsening orthopnea. Also history of leg swelling last 3-4 weeks. Husband with recent upper respiratory illness. Noted to be significantly tachycardic and tachypneic on admission with diffuse expiratory wheezes reportedly improved with nebulizer therapy. Admitted for shortness of breath, pulmonary infection versus edema. Echocardiogram revealed new diagnosis of profound left ventricular dysfunction, cardiology recommended transfer to [facility] for right and left heart catheterization. Currently patient appears well, is feeling better and is stable for transfer.  Assessment/Plan: 1. Acute hypoxic respiratory failure with tachycardia and tachypnea. Hypoxia has resolved. Continue treatment for bronchitis and heart failure. 2. New diagnosis of severe left ventricular dysfunction, acute systolic congestive heart failure. Continue Lasix per cardiology, ACE inhibitor, beta blocker, aspirin.   3. Suspected acute bronchitis. Clinically much better. Continue Levaquin, short steroid taper. 4. Elevated troponin. Trending downwards. Remains on therapeutic Lovenox per cardiology, beta blocker, aspirin. Strain versus type 2 NSTEMI. 5. Diabetes mellitus type 2. Stable, control should improve off steroids. Continue sliding scale insulin. Consider alternative oral agent on discharge. Suggest permanent discontinuation of metformin secondary to heart failure.   Continue cardiac medications per cardiology.  Short course of Levaquin, steroid taper.  Transfer to cardiology service [facility] today. Please consult [facility] if needed at [facility], will sign off for now.  Murray Hodgkins, MD  [facility]  Pager [phone] If 7PM-7AM, please contact night-coverage at [email or web address],  password [facility] 10/24/2013, 9:25 AM  LOS: 2 days   Consultants:  Radiology  Procedures:  2-D echocardiogram left ventricular ejection fraction 25-30%. Grade 1 diastolic dysfunction.  Antibiotics:  Levaquin 3/2 >>   HPI/Subjective: Bradycardia overnight with a possible. However feels quite well today. No chest pain or shortness of breath.  Objective: Filed Vitals:   10/24/13 0351 10/24/13 0402 10/24/13 0500 10/24/13 0816  BP:  158/72    Pulse:  88    Temp:  97.2 F (36.2 C)    TempSrc:  Oral    Resp:  20    Height:      Weight:   140.1 kg (308 lb 13.8 oz)   SpO2: 91% 96%  99%    Intake/Output Summary (Last 24 hours) at 10/24/13 0925 Last data filed at 10/24/13 0800  Gross per 24 hour  Intake    480 ml  Output   2100 ml  Net  -1620 ml     Filed Weights   10/22/13 1004 10/23/13 0504 10/24/13 0500  Weight: 143.8 kg (317 lb 0.3 oz) 142.1 kg (313 lb 4.4 oz) 140.1 kg (308 lb 13.8 oz)    Exam:   Afebrile, vital signs stable. No hypoxia.  General appears calm and comfortable. Speech fluent and clear.  Cardiovascular regular rate and rhythm. No murmur, rub gallop.   Respiratory clear to auscultation bilaterally. No wheezes, rales or rhonchi. Normal respiratory effort.  Psychiatric grossly normal mood and affect. Speech fluent and appropriate.  Data Reviewed:  Capillary blood sugars stable  Basic metabolic panel unremarkable  CT angiogram of chest, no evidence of PE or thoracic aortic pathology. Congestive heart failure.  Scheduled Meds: . aspirin EC  81 mg Oral Daily  . carvedilol  3.125 mg Oral BID WC  . enoxaparin (LOVENOX) injection  150 mg Subcutaneous Q12H  . furosemide  60 mg Intravenous Q12H  . insulin aspart  0-20  Units Subcutaneous TID WC  . insulin aspart  0-5 Units Subcutaneous QHS  . levalbuterol  0.63 mg Nebulization Q4H  . sodium chloride  3 mL Intravenous Q12H  . sodium chloride  3 mL Intravenous Q12H   Continuous Infusions:   Principal  Problem:   Acute systolic CHF (congestive heart failure) Active Problems:   Dyspnea   Pulmonary edema   Morbid obesity   DM type 2 (diabetes mellitus, type 2)   HTN (hypertension), benign   Acute respiratory failure with hypoxia   Acute bronchitis   Elevated troponin   Time spent 15 minutes

## 2013-10-24 NOTE — CV Procedure (Signed)
   Cardiac Catheterization Procedure Note  Name: Bridget Little MRN: [id number] DOB: 02/06/1961  Procedure: Left Heart Cath, Selective Coronary Angiography, LV angiography. A right heart catheterization was planned. However, I could not advance the wire via the antecubital vein in spite of good blood return. The vein was tortuous. I did not want to stick the femoral vein after the patient was given full dose heparin.  Indication: possible unstable angina with newly diagnosed systolic heart failure.  Medications:  Sedation:  1 mg IV Versed, 75 mcg IV Fentanyl  Contrast:  75 mL Omnipaque   Procedural Details: The right wrist was prepped, draped, and anesthetized with 1% lidocaine. Using the modified Seldinger technique, a 5 French sheath was introduced into the right radial artery. 3 mg of verapamil was administered through the sheath, weight-based unfractionated heparin was administered intravenously. A Jackie catheter was used for selective coronary angiography. A pigtail catheter was used for left ventriculography. Catheter exchanges were performed over an exchange length guidewire. There were no immediate procedural complications. A TR band was used for radial hemostasis at the completion of the procedure.  The patient was transferred to the post catheterization recovery area for further monitoring.  Procedural Findings:  Hemodynamics: AO:  172/87   mmHg LV:  165/20    mmHg LVEDP: 25  mmHg  Coronary angiography: Coronary dominance: right   Left Main:  normal  Left Anterior Descending (LAD):  Normal in size with no significant disease.  1st diagonal (D1):  Normal in size with no significant disease.  2nd diagonal (D2):  Large in size with no significant disease.  3rd diagonal (D3):  Normal in size with no significant disease.  Circumflex (LCx):  Normal in size and nondominant the vessel is free of any significant disease.  1st obtuse marginal:  Small in size with minor  irregularities.  2nd obtuse marginal:  Normal in size with no significant disease.  3rd obtuse marginal:  Normal in size with no significant disease   Right Coronary Artery: normal in size and dominant the vessel is free of any significant disease.  Posterior descending artery: large in size with no significant disease  Posterior AV segment: normal in size with no significant disease.  Posterolateral branchs:  3 normal size branchs which are free of significant disease.  Left ventriculography: Left ventricular systolic function is severely reduced , LVEF is estimated at 25-30 %, there is no significant mitral regurgitation   Final Conclusions:   1. Normal coronary arteries. 2. Severely reduced LV systolic function with an ejection fraction of 25-30% due to nonischemic cardiomyopathy. 3. Moderately elevated left ventricular end-diastolic pressure  Recommendations:  Continue diuresis. Treat medically for nonischemic cardiomyopathy. Blood pressure control is recommended. The patient should be screened for sleep apnea and treated as indicated.  Kathlyn Sacramento MD, [facility] 10/24/2013, 6:01 PM

## 2013-10-24 NOTE — Progress Notes (Signed)
  Consulting cardiologist: Dr. Deyton Ellenbecker G. Courtlyn Aki  Subjective:    No chest pain, breathing status still better with some congestion.  Objective:   Temp:  [97.2 F (36.2 C)-98 F (36.7 C)] 97.2 F (36.2 C) (03/04 0402) Pulse Rate:  [55-104] 88 (03/04 0402) Resp:  [20] 20 (03/04 0402) BP: (137-158)/(65-73) 158/72 mmHg (03/04 0402) SpO2:  [91 %-100 %] 99 % (03/04 0816) Weight:  [308 lb 13.8 oz (140.1 kg)] 308 lb 13.8 oz (140.1 kg) (03/04 0500) Last BM Date: 10/24/13  Filed Weights   10/22/13 1004 10/23/13 0504 10/24/13 0500  Weight: 317 lb 0.3 oz (143.8 kg) 313 lb 4.4 oz (142.1 kg) 308 lb 13.8 oz (140.1 kg)    Intake/Output Summary (Last 24 hours) at 10/24/13 1117 Last data filed at 10/24/13 0800  Gross per 24 hour  Intake    480 ml  Output   1300 ml  Net   -820 ml    Telemetry: Sinus rhythm.  Exam:  General: Appears comfortable at rest.   Lungs: Still with scattered rhonchi, prolonged expiratory phase.   Cardiac: RRR, no obvious gallop.   Extremities: Improved edema.   Lab Results:  Basic Metabolic Panel:  Recent Labs Lab 10/22/13 0424 10/23/13 0534 10/24/13 0548  NA 143 146 145  K 4.0 3.9 3.7  CL 105 103 102  CO2 27 30 32  GLUCOSE 180* 237* 173*  BUN 24* 19 30*  CREATININE 0.74 0.72 0.83  CALCIUM 8.9 9.6 9.5    Liver Function Tests:  Recent Labs Lab 10/22/13 0424 10/23/13 0534  AST 27 16  ALT 28 21  ALKPHOS 103 92  BILITOT 0.2* 0.6  PROT 7.0 6.8  ALBUMIN 3.2* 3.0*    CBC:  Recent Labs Lab 10/22/13 0424 10/23/13 0534  WBC 11.7* 18.4*  HGB 12.9 12.8  HCT 41.5 40.3  MCV 81.5 79.3  PLT 378 359    Cardiac Enzymes:  Recent Labs Lab 10/22/13 1045 10/22/13 1636 10/22/13 2241  TROPONINI 0.80* 0.63* 0.48*    BNP:  Recent Labs  10/22/13 0424  PROBNP 642.6*    Echocardiogram (3/3): Study Conclusions  - Left ventricle: The cavity size was normal. Wall thickness was increased in a pattern of mild LVH. Systolic  function was severely reduced. The estimated ejection fraction was in the range of 25% to 30%. Possible severe hypokinesis of the anteroseptal myocardium. Doppler parameters are consistent with abnormal left ventricular relaxation (grade 1 diastolic dysfunction). - Aortic valve: Poorly visualized. Mildly calcified annulus. Probably trileaflet. No significant regurgitation. - Right ventricle: Systolic function was reduced. - Right atrium: Central venous pressure: 3mm Hg (est). - Tricuspid valve: Physiologic regurgitation. - Pulmonary arteries: Systolic pressure could not be accurately estimated. - Pericardium, extracardiac: A prominent pericardial fat pad was present. Impressions:  - Extremely limited study. There is mild LVH, overall normal LV chamber size, LVEF roughly estimated at 25-30% based on available images. There appears to be significant anteroseptal hypokinesis based on some views. Optison contrast may provide better images. There is grade 1 diastolic dysfunction. RV contraction is abnormal as well. No significant degree of mitral or tricuspid regurgitation noted, cannotassess PASP. CVP appears normal. Epicardial fat pad noted.   Medications:   Scheduled Medications: . aspirin EC  81 mg Oral Daily  . carvedilol  3.125 mg Oral BID WC  . enoxaparin (LOVENOX) injection  150 mg Subcutaneous Q12H  . furosemide  60 mg Intravenous Q12H  . insulin aspart  0-20 Units Subcutaneous TID WC  .   insulin aspart  0-5 Units Subcutaneous QHS  . levalbuterol  0.63 mg Nebulization Q4H  . sodium chloride  3 mL Intravenous Q12H  . sodium chloride  3 mL Intravenous Q12H      PRN Medications:  sodium chloride, acetaminophen, acetaminophen, hydrALAZINE, levalbuterol, ondansetron (ZOFRAN) IV, ondansetron, oxyCODONE, sodium chloride   Assessment:   1. Presentation with progressive exertional shortness of breath culminating with significant breathlessness at rest, recent wheezing and  cough as well. No chest pain symptoms. She has had progressive leg edema over the last several weeks. Currently being treated for possible URI, husband has been sick with URI. Also diagnosed with cardiomyopathy based on extremely limited echocardiogram, LVEF roughly estimated in the range of 25-30%, possible more prominent hypokinesis of the anteroseptal wall. Grade 1 diastolic dysfunction also noted. Mild increase in troponin I as detailed above.  2. Possible bronchitis, empirically on antibiotics and steroids per the primary team. No definite infiltrate by chest x-ray.  3. Morbid obesity.  4. History of hypertension.  5. Leukocytosis, on steroids, afebrile.  6. Incidentally noted enlargement of the left thyroid lobe by recent chest CT. Will eventually require further workup, consider thyroid ultrasound.   Plan/Discussion:    Discussed with patient and husband, also hospitalist team. Plan is for transfer to [facility] today in anticipation of a left and right heart catheterization for further evaluation of her cardiomyopathy and to better tailor her medical regimen and treatment options.   Satira Sark, M.D., F.A.C.C.

## 2013-10-25 DIAGNOSIS — I5021 Acute systolic (congestive) heart failure: Secondary | ICD-10-CM

## 2013-10-25 DIAGNOSIS — I428 Other cardiomyopathies: Secondary | ICD-10-CM

## 2013-10-25 LAB — BASIC METABOLIC PANEL
BUN: 21 mg/dL (ref 6–23)
CALCIUM: 9.6 mg/dL (ref 8.4–10.5)
CO2: 34 mEq/L — ABNORMAL HIGH (ref 19–32)
CREATININE: 0.8 mg/dL (ref 0.50–1.10)
Chloride: 99 mEq/L (ref 96–112)
GFR calc non Af Amer: 83 mL/min — ABNORMAL LOW (ref >90)
Glucose, Bld: 203 mg/dL — ABNORMAL HIGH (ref 70–99)
Potassium: 4.1 mEq/L (ref 3.7–5.3)
Sodium: 144 mEq/L (ref 137–147)

## 2013-10-25 LAB — GLUCOSE, CAPILLARY
GLUCOSE-CAPILLARY: 171 mg/dL — AB (ref 70–99)
GLUCOSE-CAPILLARY: 205 mg/dL — AB (ref 70–99)
Glucose-Capillary: 167 mg/dL — ABNORMAL HIGH (ref 70–99)
Glucose-Capillary: 203 mg/dL — ABNORMAL HIGH (ref 70–99)

## 2013-10-25 MED ORDER — CARVEDILOL 12.5 MG PO TABS
12.5000 mg | ORAL_TABLET | Freq: Two times a day (BID) | ORAL | Status: DC
  Administered 2013-10-25 – 2013-10-28 (×6): 12.5 mg via ORAL
  Filled 2013-10-25 (×8): qty 1

## 2013-10-25 MED ORDER — FUROSEMIDE 10 MG/ML IJ SOLN
INTRAMUSCULAR | Status: AC
  Filled 2013-10-25: qty 8

## 2013-10-25 MED ORDER — SPIRONOLACTONE 12.5 MG HALF TABLET
12.5000 mg | ORAL_TABLET | Freq: Every day | ORAL | Status: DC
  Administered 2013-10-25 – 2013-10-26 (×2): 12.5 mg via ORAL
  Filled 2013-10-25 (×2): qty 1

## 2013-10-25 MED ORDER — CARVEDILOL 6.25 MG PO TABS
6.2500 mg | ORAL_TABLET | Freq: Once | ORAL | Status: AC
  Administered 2013-10-25: 6.25 mg via ORAL
  Filled 2013-10-25: qty 1

## 2013-10-25 NOTE — Progress Notes (Signed)
Pt had episode of HR going to 32, but for only a few seconds, this also happened last night per RN note, MD aware of pt's HR doing this, no S/S, cardiology discussing with pt about planning for a sleep study to r/o sleep apnea; I put pt on 2L of O2 while sleeping, will continue to monitor, Thanks Arvella Nigh RN.

## 2013-10-25 NOTE — Progress Notes (Signed)
I had a lengthy conversation with Bridget Little concerning her HF diagnosis.  She seems to have a good understanding of Heart Failure and what is recommended for her to do.  We reviewed the "Living with HF" book including signs and symptoms of HF, when to call the physician, importance of daily weights, low sodium diet, and taking medications as prescribed.  She seems very motivated to do well.  She lives home with husband in [place] and does most of the cooking.  She would benefit from a dietician consult for more detailed information regarding low sodium diet.  She will need close follow up at discharge.  I will come back before discharge and reinforce education.  Carole Binning RN, BSN, PCCN--Heart Failure Art therapist

## 2013-10-25 NOTE — Plan of Care (Signed)
Problem: Phase I Progression Outcomes Goal: EF % per last Echo/documented,Core Reminder form on chart Outcome: Completed/Met Date Met:  10/25/13 EF 25-30%(10-24-13)

## 2013-10-25 NOTE — Progress Notes (Signed)
Nutrition Education Note  RD consulted for nutrition education regarding new onset CHF.  RD provided "Low Sodium Nutrition Therapy" handout from the Academy of [employer]. Reviewed patient's dietary recall. Provided examples on ways to decrease sodium intake in diet. Discouraged intake of processed foods and use of salt shaker. Encouraged fresh fruits and vegetables as well as whole grain sources of carbohydrates to maximize fiber intake.  Pt able to identify a few high sodium foods in her diet that she will start to limit/avoid: bacon, sausage, salad dressing, buttermilk.   RD discussed why it is important for patient to adhere to diet recommendations, and emphasized the role of fluids, foods to avoid, and importance of weighing self daily. Teach back method used.  Expect great compliance. Pt seems very knowledgeable about healthful eating and seems highly motivated to make diet changes for herself and to help better the health of her family.  Body mass index is 51.63 kg/(m^2). Pt meets criteria for Morbid Obesity based on current BMI. Pt expresses desire to lose weight and states she has started making healthy diet changes and has been watching her portions sizes.   Current diet order is Heart Healthy, patient is consuming approximately 75-100% of meals at this time. Labs and medications reviewed. No further nutrition interventions warranted at this time. RD contact information provided. If additional nutrition issues arise, please re-consult RD.   Pryor Ochoa RD, LDN Inpatient Clinical Dietitian Pager: [phone] After Hours Pager: [phone]

## 2013-10-25 NOTE — Progress Notes (Addendum)
Patient Name: Bridget Little Date of Encounter: 10/25/2013  Principal Problem:   Acute systolic CHF (congestive heart failure) Active Problems:   Dyspnea   Pulmonary edema   Morbid obesity   DM type 2 (diabetes mellitus, type 2)   HTN (hypertension), benign   Acute respiratory failure with hypoxia   Acute bronchitis   Elevated troponin    SUBJECTIVE: Tolerated cath well, breathing much better.   OBJECTIVE Filed Vitals:   10/24/13 1859 10/24/13 1940 10/24/13 2330 10/25/13 0546  BP: 165/89 137/77 139/83 159/79  Pulse: 89 103 85 80  Temp:  98 F (36.7 C) 98.2 F (36.8 C) 97.6 F (36.4 C)  TempSrc:  Oral Oral Oral  Resp:  18 18 19   Height:      Weight:    300 lb 14.9 oz (136.5 kg)  SpO2:  98% 96% 94%    Intake/Output Summary (Last 24 hours) at 10/25/13 0820 Last data filed at 10/25/13 0554  Gross per 24 hour  Intake 540.83 ml  Output   2650 ml  Net -2109.17 ml   Filed Weights   10/24/13 0500 10/24/13 1207 10/25/13 0546  Weight: 308 lb 13.8 oz (140.1 kg) 302 lb 4.8 oz (137.122 kg) 300 lb 14.9 oz (136.5 kg)    PHYSICAL EXAM General: Well developed, well nourished, female in no acute distress. Head: Normocephalic, atraumatic.  Neck: Supple without bruits, JVD 8-9 cm. Lungs:  Resp regular and unlabored, rales bases. Heart: RRR, S1, S2, no S3, S4, or murmur; no rub. Abdomen: Soft, non-tender, non-distended, BS + x 4.  Extremities: No clubbing, cyanosis, no edema.  No problems with right radial cath site Neuro: Alert and oriented X 3. Moves all extremities spontaneously. Psych: Normal affect.  LABS: CBC: Recent Labs  10/23/13 0534 10/24/13 1919  WBC 18.4* 13.8*  HGB 12.8 14.7  HCT 40.3 44.1  MCV 79.3 78.6  PLT 359 381   INR: Recent Labs  10/24/13 1049  INR [id number]   Basic Metabolic Panel: Recent Labs  10/23/13 0534 10/24/13 0548 10/24/13 1919  NA 146 145  --   K 3.9 3.7  --   CL 103 102  --   CO2 30 32  --   GLUCOSE 237* 173*  --     BUN 19 30*  --   CREATININE 0.72 0.83 0.83  CALCIUM 9.6 9.5  --    Liver Function Tests: Recent Labs  10/23/13 0534  AST 16  ALT 21  ALKPHOS 92  BILITOT 0.6  PROT 6.8  ALBUMIN 3.0*   Cardiac Enzymes: Recent Labs  10/22/13 1045 10/22/13 1636 10/22/13 2241  TROPONINI 0.80* 0.63* 0.48*   BNP: Pro B Natriuretic peptide (BNP)  Date/Time Value Ref Range Status  10/22/2013  4:24 AM 642.6* 0 - 125 pg/mL Final   Hemoglobin A1C: Recent Labs  10/22/13 1045  HGBA1C 6.1*   Fasting Lipid Panel: Recent Labs  10/23/13 0534  CHOL 192  HDL 62  LDLCALC 112*  TRIG 90  CHOLHDL 3.1   Thyroid Function Tests: Recent Labs  10/22/13 1045  TSH 2.752    TELE: SR, ST, no sig. ectopy       Echo: 10/23/2013 Study Conclusions - Left ventricle: The cavity size was normal. Wall thickness was increased in a pattern of mild LVH. Systolic function was severely reduced. The estimated ejection fraction was in the range of 25% to 30%. Possible severe hypokinesis of the anteroseptal myocardium. Doppler parameters are consistent with abnormal left  ventricular relaxation (grade 1 diastolic dysfunction). - Aortic valve: Poorly visualized. Mildly calcified annulus. Probably trileaflet. No significant regurgitation. - Right ventricle: Systolic function was reduced. - Right atrium: Central venous pressure: 98mm Hg (est). - Tricuspid valve: Physiologic regurgitation. - Pulmonary arteries: Systolic pressure could not be accurately estimated. - Pericardium, extracardiac: A prominent pericardial fat pad was present. Impressions: - Extremely limited study. There is mild LVH, overall normal LV chamber size, LVEF roughly estimated at 25-30% based on available images. There appears to be significant anteroseptal hypokinesis based on some views. Optison contrast may provide better images. There is grade 1 diastolic dysfunction. RV contraction is abnormal as well. No significant degree of mitral  or tricuspid regurgitation noted, cannotassess PASP. CVP appears normal. Epicardial fat pad noted.  Cardiac cath: 03/04 Final Conclusions:  1. Normal coronary arteries.  2. Severely reduced LV systolic function with an ejection fraction of 25-30% due to nonischemic cardiomyopathy.  3. Moderately elevated left ventricular end-diastolic pressure, LVEDP 25  Radiology Dg Chest 2 View 10/22/2013   CLINICAL DATA:  Shortness of breath and wheezing  EXAM: CHEST  2 VIEW  COMPARISON:  None.  FINDINGS: Bilateral interstitial and airspace abnormality. There is asymmetry, especially of the airspace opacification which is asymmetrically increased in the peripheral left mid lung. Fissural thickening present. No significant pleural effusion. Cardiomegaly and vascular pedicle widening. No pneumothorax. No acute osseous findings.  IMPRESSION: Bilateral pulmonary abnormality which could represent pulmonary edema or multi focal infection.   Electronically Signed   By: Jorje Guild M.D.   On: 10/22/2013 05:26   Ct Angio Chest Pe W/cm &/or Wo Cm 10/22/2013   CLINICAL DATA:  Progressive lower extremity swelling with sudden onset of dyspnea and wheezing.  EXAM: CT ANGIOGRAPHY CHEST WITH CONTRAST  TECHNIQUE: Multidetector CT imaging of the chest was performed using the standard protocol during bolus administration of intravenous contrast. Multiplanar CT image reconstructions and MIPs were obtained to evaluate the vascular anatomy. The study is limited due to the patient's body habitus.  CONTRAST:  112mL OMNIPAQUE IOHEXOL 350 MG/ML SOLN  COMPARISON:  DG CHEST 2 VIEW dated 10/22/2013  FINDINGS: Contrast within the pulmonary arterial tree is normal in appearance. There are no filling defects to suggest an acute pulmonary embolism. The caliber of the thoracic aorta is normal. There is no evidence of a false lumen. The cardiac chambers are enlarged. There is no pericardial or pleural effusion. There is mild engorgement of the azygos  vein. The inferior vena cava appears engorged. No mediastinal or hilar lymphadenopathy is demonstrated. The retrosternal soft tissues appear normal. The thoracic esophagus is not abnormally distended. There is marked enlargement of the left thyroid lobe which is only partially included in the field of view.  At lung window settings there are patchy alveolar densities bilaterally. These are most conspicuous in but are not confined to the lower lobes. At bone window settings the thoracic spine and sternum exhibit no acute abnormalities. The visualized portions of the ribs also exhibit no acute abnormalities.  Within the upper abdomen the observed portions of the liver and spleen appear normal.  Review of the MIP images confirms the above findings.  IMPRESSION: 1. There is no evidence of an acute pulmonary embolism or acute thoracic aortic pathology. 2. The findings likely reflect congestive heart failure with pulmonary interstitial and alveolar edema. Certainly bilateral pneumonia cannot be absolutely excluded. 3. There is no pleural nor pericardial effusion. 4. There is marked enlargement of the left thyroid lobe that is  only partially included in the field of view.   Electronically Signed   By: David  Martinique   On: 10/22/2013 18:42   Current Medications:  . aspirin  81 mg Oral Pre-Cath  . aspirin EC  81 mg Oral Daily  . busPIRone  15 mg Oral BID  . carvedilol  6.25 mg Oral BID AC  . enoxaparin (LOVENOX) injection  40 mg Subcutaneous Q24H  . furosemide      . furosemide  60 mg Intravenous Q12H  . insulin aspart  0-20 Units Subcutaneous TID WC  . insulin aspart  0-5 Units Subcutaneous QHS  . lisinopril  40 mg Oral Daily  . sodium chloride  3 mL Intravenous Q12H    ASSESSMENT AND PLAN: 53 yo female with no previous cardiac hx admitted [facility] 03/02 w/ SOB, concern for PNA, CHF. Seen by cards, ez slightly elevated, volume overloaded, EF low on echo. Tx to [facility] for cath, further eval.  Principal Problem:    Acute combined systolic and diastolic CHF (congestive heart failure) - wt down 10 lbs since admit, continue IV Lasix today, possible change to PO in am. Follow BMET, starting today. U/A to do right heart cath.   Active Problems:   Pulmonary edema - see above    Morbid obesity - encourage dietary modifications    DM type 2 (diabetes mellitus, type 2) - diet compliance encouraged    HTN (hypertension), benign - elevated; HR high enough, increase Coreg 6.25 to 12.5 mg bid; PTA also lisinopril/HCTZ 20/25, now on lisinopril at higher dose.    Acute respiratory failure with hypoxia - improved    Acute bronchitis - no longer wheezing, WBC trending down. She was on Levaquin and Solu-medrol at [facility], d/c'd on transfer    Elevated troponin - NICM by cath, likely Type 2 NSTEMI due to CHF   Plan: continue diuresis, ambulate, possible d/c 24-48 hours if continues to improve.  Signed, Rosaria Ferries , PA-C 8:20 AM 10/25/2013  Patient seen and examined. Agree with assessment and plan. Nonischemic cardiomyopathy with normal coronaries and EF 25 - 30%. Cr 0.83. Will titrate carvedilol and add low dose aldosterone blockade with spironolactone 12.5 today and increase to bid tomorrow if tolerates. F/U renal fxn and will assess BNP in am. Evaluate for OSA as outpatient.   Troy Sine, MD, [facility] 10/25/2013 11:32 AM

## 2013-10-26 LAB — BASIC METABOLIC PANEL
BUN: 23 mg/dL (ref 6–23)
CALCIUM: 9.3 mg/dL (ref 8.4–10.5)
CO2: 35 meq/L — AB (ref 19–32)
Chloride: 100 mEq/L (ref 96–112)
Creatinine, Ser: 0.88 mg/dL (ref 0.50–1.10)
GFR calc Af Amer: 85 mL/min — ABNORMAL LOW (ref >90)
GFR calc non Af Amer: 74 mL/min — ABNORMAL LOW (ref >90)
GLUCOSE: 178 mg/dL — AB (ref 70–99)
Potassium: 3.8 mEq/L (ref 3.7–5.3)
Sodium: 145 mEq/L (ref 137–147)

## 2013-10-26 LAB — GLUCOSE, CAPILLARY
GLUCOSE-CAPILLARY: 192 mg/dL — AB (ref 70–99)
GLUCOSE-CAPILLARY: 205 mg/dL — AB (ref 70–99)
GLUCOSE-CAPILLARY: 227 mg/dL — AB (ref 70–99)
Glucose-Capillary: 163 mg/dL — ABNORMAL HIGH (ref 70–99)

## 2013-10-26 LAB — PRO B NATRIURETIC PEPTIDE: Pro B Natriuretic peptide (BNP): 378.3 pg/mL — ABNORMAL HIGH (ref 0–125)

## 2013-10-26 MED ORDER — SPIRONOLACTONE 12.5 MG HALF TABLET
12.5000 mg | ORAL_TABLET | Freq: Two times a day (BID) | ORAL | Status: DC
  Administered 2013-10-26 – 2013-10-28 (×4): 12.5 mg via ORAL
  Filled 2013-10-26 (×8): qty 1

## 2013-10-26 MED ORDER — FUROSEMIDE 40 MG PO TABS
60.0000 mg | ORAL_TABLET | Freq: Two times a day (BID) | ORAL | Status: DC
  Administered 2013-10-26 – 2013-10-28 (×4): 60 mg via ORAL
  Filled 2013-10-26 (×6): qty 1

## 2013-10-26 NOTE — Progress Notes (Addendum)
   Subjective:  Breathing much better; "fluid coming out!"  Objective:   Vital Signs in the last 24 hours: Temp:  [97.9 F (36.6 C)-98.2 F (36.8 C)] 98.1 F (36.7 C) (03/06 1337) Pulse Rate:  [70-91] 73 (03/06 1337) Resp:  [18-20] 20 (03/06 1337) BP: (119-147)/(60-77) 141/71 mmHg (03/06 1337) SpO2:  [94 %-97 %] 94 % (03/06 1337) Weight:  [298 lb 8.1 oz (135.4 kg)] 298 lb 8.1 oz (135.4 kg) (03/06 0454)  Intake/Output from previous day: 03/05 0701 - 03/06 0700 In: 1020 [P.O.:1020] Out: 2225 [Urine:2225]  Medications: . aspirin EC  81 mg Oral Daily  . busPIRone  15 mg Oral BID  . carvedilol  12.5 mg Oral BID AC  . enoxaparin (LOVENOX) injection  40 mg Subcutaneous Q24H  . furosemide  60 mg Intravenous Q12H  . insulin aspart  0-20 Units Subcutaneous TID WC  . insulin aspart  0-5 Units Subcutaneous QHS  . lisinopril  40 mg Oral Daily  . sodium chloride  3 mL Intravenous Q12H  . spironolactone  12.5 mg Oral Daily       Physical Exam:   General appearance: alert, cooperative and no distress Neck: no adenopathy, no JVD, supple, symmetrical, trachea midline and thyroid not enlarged, symmetric, no tenderness/mass/nodules Lungs: improved aeration; no rales Heart: regular rate and rhythm, S1, S2 normal, no murmur, click, rub or gallop Abdomen: soft, non-tender; bowel sounds normal; no masses,  no organomegaly Extremities: edema trace bilaterally, improved Neurologic: Grossly normal   Rate: 88  Rhythm: normal sinus rhythm  Lab Results:    Recent Labs  10/25/13 1025 10/26/13 0410  NA 144 145  K 4.1 3.8  CL 99 100  CO2 34* 35*  GLUCOSE 203* 178*  BUN 21 23  CREATININE 0.80 0.88   No results found for this basename: TROPONINI, CK, MB,  in the last 72 hours Hepatic Function Panel No results found for this basename: PROT, ALBUMIN, AST, ALT, ALKPHOS, BILITOT, BILIDIR, IBILI,  in the last 72 hours  Recent Labs  10/24/13 1049  INR 0.99   BNP (last 3  results)  Recent Labs  10/22/13 0424 10/26/13 0400  PROBNP 642.6* 378.3*    Lipid Panel     Component Value Date/Time   CHOL 192 10/23/2013 0534   TRIG 90 10/23/2013 0534   HDL 62 10/23/2013 0534   CHOLHDL 3.1 10/23/2013 0534   VLDL 18 10/23/2013 0534   LDLCALC 112* 10/23/2013 0534      Imaging:  No results found.    Assessment/Plan:   Principal Problem:   Acute systolic CHF (congestive heart failure) Active Problems:   Pulmonary edema   Morbid obesity   DM type 2 (diabetes mellitus, type 2)   HTN (hypertension), benign   Acute respiratory failure with hypoxia   Acute bronchitis   Elevated troponin   I/O excellent diuresis yesterday -1205; tolerating addition of aldosterone blockade. Total -7431 net output since admission. EF 25 - 30%. Cr .88 today. Feels much better. No dyspnea. Will change spironolactone to bid and change IV lasix 60 bid to oral.   Troy Sine, MD, [facility] 10/26/2013, 2:14 PM

## 2013-10-26 NOTE — Progress Notes (Signed)
Utilization Review Completed Elishia Kaczorowski J. Daejon Lich, RN, BSN, NCM [phone]  

## 2013-10-26 NOTE — Progress Notes (Signed)
I stopped back in to see Bridget Little.  She is hoping to discharge today or tomorrow.  We reviewed again and she was able to do teach back signs and symptoms of HF, importance of daily weights, when to call the physician, low sodium diet and taking medications as prescribed.  She seems to grasp the importance of compliance and is motivated to do well.  Carole Binning RN, BSN, PCCN--Heart Failure Art therapist

## 2013-10-27 LAB — BASIC METABOLIC PANEL
BUN: 25 mg/dL — ABNORMAL HIGH (ref 6–23)
CALCIUM: 9.2 mg/dL (ref 8.4–10.5)
CO2: 33 mEq/L — ABNORMAL HIGH (ref 19–32)
Chloride: 100 mEq/L (ref 96–112)
Creatinine, Ser: 0.88 mg/dL (ref 0.50–1.10)
GFR calc non Af Amer: 74 mL/min — ABNORMAL LOW (ref >90)
GFR, EST AFRICAN AMERICAN: 85 mL/min — AB (ref >90)
Glucose, Bld: 179 mg/dL — ABNORMAL HIGH (ref 70–99)
POTASSIUM: 4.3 meq/L (ref 3.7–5.3)
SODIUM: 144 meq/L (ref 137–147)

## 2013-10-27 LAB — GLUCOSE, CAPILLARY
GLUCOSE-CAPILLARY: 188 mg/dL — AB (ref 70–99)
GLUCOSE-CAPILLARY: 219 mg/dL — AB (ref 70–99)
Glucose-Capillary: 167 mg/dL — ABNORMAL HIGH (ref 70–99)
Glucose-Capillary: 186 mg/dL — ABNORMAL HIGH (ref 70–99)

## 2013-10-27 NOTE — Progress Notes (Signed)
Patient ID: Bridget Little, female   DOB: 1961-03-30, 53 y.o.   MRN: [id number]   Subjective:  Improving  Has scale at home   Nonischemic DCM EF 25% with elevated EDP at cath   Objective:   Vital Signs in the last 24 hours: Temp:  [97.6 F (36.4 C)-98.1 F (36.7 C)] 97.6 F (36.4 C) (03/07 7829) Pulse Rate:  [73-79] 77 (03/07 0623) Resp:  [18-20] 20 (03/07 0623) BP: (105-141)/(60-77) 133/70 mmHg (03/07 0623) SpO2:  [94 %-97 %] 94 % (03/07 0623) Weight:  [297 lb 12.8 oz (135.081 kg)] 297 lb 12.8 oz (135.081 kg) (03/07 0623)  Intake/Output from previous day: 03/06 0701 - 03/07 0700 In: 1443 [P.O.:1440; I.V.:3] Out: 1250 [Urine:1250]  Medications: . aspirin EC  81 mg Oral Daily  . busPIRone  15 mg Oral BID  . carvedilol  12.5 mg Oral BID AC  . enoxaparin (LOVENOX) injection  40 mg Subcutaneous Q24H  . furosemide  60 mg Oral BID  . insulin aspart  0-20 Units Subcutaneous TID WC  . insulin aspart  0-5 Units Subcutaneous QHS  . lisinopril  40 mg Oral Daily  . sodium chloride  3 mL Intravenous Q12H  . spironolactone  12.5 mg Oral BID       Physical Exam:   General appearance: alert, cooperative and no distress Neck: no adenopathy, no JVD, supple, symmetrical, trachea midline and thyroid not enlarged, symmetric, no tenderness/mass/nodules Lungs: improved aeration; no rales Heart: regular rate and rhythm, S1, S2 normal, no murmur, click, rub or gallop Abdomen: soft, non-tender; bowel sounds normal; no masses,  no organomegaly Extremities: edema trace bilaterally, improved Neurologic: Grossly normal   Rate: 88  Rhythm: normal sinus rhythm  Lab Results:     Recent Labs  10/26/13 0410 10/27/13 0335  NA 145 144  K 3.8 4.3  CL 100 100  CO2 35* 33*  GLUCOSE 178* 179*  BUN 23 25*  CREATININE 0.88 0.88    Recent Labs  10/24/13 1049  INR 0.99   BNP (last 3 results)  Recent Labs  10/22/13 0424 10/26/13 0400  PROBNP 642.6* 378.3*    Lipid Panel      Component Value Date/Time   CHOL 192 10/23/2013 0534   TRIG 90 10/23/2013 0534   HDL 62 10/23/2013 0534   CHOLHDL 3.1 10/23/2013 0534   VLDL 18 10/23/2013 0534   LDLCALC 112* 10/23/2013 0534      Imaging:  No results found.    Assessment/Plan:   Principal Problem:   Acute systolic CHF (congestive heart failure) Active Problems:   Pulmonary edema   Morbid obesity   DM type 2 (diabetes mellitus, type 2)   HTN (hypertension), benign   Acute respiratory failure with hypoxia   Acute bronchitis   Elevated troponin   CHF:  On oral pills now  Should be ready for d/c in am.  Check BMET  Outpatient f/u with Dr Domenic Polite and CHF clinic    Troy Sine, MD, [facility] 10/27/2013, 7:54 AM

## 2013-10-28 LAB — GLUCOSE, CAPILLARY
GLUCOSE-CAPILLARY: 189 mg/dL — AB (ref 70–99)
Glucose-Capillary: 193 mg/dL — ABNORMAL HIGH (ref 70–99)
Glucose-Capillary: 175 mg/dL — ABNORMAL HIGH (ref 70–99)

## 2013-10-28 MED ORDER — FUROSEMIDE 40 MG PO TABS: 60.0000 mg | ORAL_TABLET | Freq: Two times a day (BID) | ORAL | Status: DC

## 2013-10-28 MED ORDER — CARVEDILOL 12.5 MG PO TABS: 12.5000 mg | ORAL_TABLET | Freq: Two times a day (BID) | ORAL | Status: DC

## 2013-10-28 MED ORDER — SPIRONOLACTONE 25 MG PO TABS: 12.5000 mg | ORAL_TABLET | Freq: Two times a day (BID) | ORAL | Status: AC

## 2013-10-28 NOTE — Discharge Summary (Signed)
Physician Discharge Summary     Patient ID: Bridget Little MRN: [id number] DOB/AGE: [birth year] 53 y.o.  Admit date: 10/22/2013 Discharge date: 10/28/2013  Admission Diagnoses:  Acute systolic Heart Failure.  Discharge Diagnoses:  Principal Problem:   Acute systolic CHF (congestive heart failure) Active Problems:   Pulmonary edema   Morbid obesity   DM type 2 (diabetes mellitus, type 2)   HTN (hypertension), benign   Acute respiratory failure with hypoxia   Acute bronchitis   Elevated troponin   Nonischemic cardiomyopathy  EF 25-30%  Discharged Condition: stable  Hospital Course:   Bridget Little is a 53 y.o.female admitted to the hospital for further evaluation of shortness of breath associated with wheezing, orthopnea, also leg swelling.  Over the last 2-3 months she has been noticeably more short of breath with activity, has had mild leg edema, but uses TED hose and usually this has not been a problem. Within the last 3 weeks her leg edema has been worse. Symptoms prompting admission included the fairly sudden onset of significant shortness of breath at rest associated with cough and wheezing. Her husband has had an upper respiratory tract infection recently. She otherwise denies any chest pain or palpitations.    She feels better this afternoon on examination, has been treated with pulmonary inhalers, antibiotics, and also IV Lasix. Chest x-ray is concerning for pulmonary edema, although cannot exclude infectious process. She has been afebrile, white blood cell count is 11.7. Pro-BNP 642. Troponin I has increased from less than 0.30 up to 0.80. ECG shows sinus tachycardia with no acute ST segment changes.  Family history reviewed, no premature CAD or cardiomyopathy.   The patient was admitted and started on IV lasix.  Antibiotic and steroids added.  2 D echo revealed poor EF of 25-30% with grade one diastolic dysfunction(see full report below).  Sliding scale insulin ordered.  Weight-loss counseling was provided. Troponin was mildly elevated at 0.80 and BNP 642.6.  She had reduction in both.   Ct angio of the chest was negative for PE or acute thoracic aortic pathology.  Left heart cath revealed normal coronary arteries  And elevated LVEDP.  She ultimately diuresed 7 liters and was placed on aldactone and PO lasix therapy.  Coreg was increased.  ACE-I was added.  The heart failure RN met and dicussed daily weights, low sodium diet, and taking medications as prescribed.  Follow up will be arranged with CHF clinic.  The patient was seen by Dr. Johnsie Cancel who felt she was stable for DC home.      Consults: cardiology  Significant Diagnostic Studies:  Echo Study Conclusions  - Left ventricle: The cavity size was normal. Wall thickness was increased in a pattern of mild LVH. Systolic function was severely reduced. The estimated ejection fraction was in the range of 25% to 30%. Possible severe hypokinesis of the anteroseptal myocardium. Doppler parameters are consistent with abnormal left ventricular relaxation (grade 1 diastolic dysfunction). - Aortic valve: Poorly visualized. Mildly calcified annulus. Probably trileaflet. No significant regurgitation. - Right ventricle: Systolic function was reduced. - Right atrium: Central venous pressure: 53m Hg (est). - Tricuspid valve: Physiologic regurgitation. - Pulmonary arteries: Systolic pressure could not be accurately estimated. - Pericardium, extracardiac: A prominent pericardial fat pad was present. Impressions:  - Extremely limited study. There is mild LVH, overall normal LV chamber size, LVEF roughly estimated at 25-30% based on available images. There appears to be significant anteroseptal hypokinesis based on some views. Optison contrast may provide better images.  There is grade 1 diastolic dysfunction. RV contraction is abnormal as well. No significant degree of mitral or tricuspid regurgitation noted,  cannotassess PASP. CVP appears normal. Epicardial fat pad noted.  Right and left heart cath Procedural Findings:  Hemodynamics:  AO: 172/87 mmHg  LV: 165/20 mmHg  LVEDP: 25 mmHg  Coronary angiography:  Coronary dominance: right  Left Main: normal  Left Anterior Descending (LAD): Normal in size with no significant disease.  1st diagonal (D1): Normal in size with no significant disease.  2nd diagonal (D2): Large in size with no significant disease.  3rd diagonal (D3): Normal in size with no significant disease.  Circumflex (LCx): Normal in size and nondominant the vessel is free of any significant disease.  1st obtuse marginal: Small in size with minor irregularities.  2nd obtuse marginal: Normal in size with no significant disease.  3rd obtuse marginal: Normal in size with no significant disease  Right Coronary Artery: normal in size and dominant the vessel is free of any significant disease.  Posterior descending artery: large in size with no significant disease  Posterior AV segment: normal in size with no significant disease.  Posterolateral branchs: 3 normal size branchs which are free of significant disease. Left ventriculography: Left ventricular systolic function is severely reduced , LVEF is estimated at 25-30 %, there is no significant mitral regurgitation  Final Conclusions:  1. Normal coronary arteries.  2. Severely reduced LV systolic function with an ejection fraction of 25-30% due to nonischemic cardiomyopathy.  3. Moderately elevated left ventricular end-diastolic pressure  Recommendations:  Continue diuresis. Treat medically for nonischemic cardiomyopathy. Blood pressure control is recommended. The patient should be screened for sleep apnea and treated as indicated.  Kathlyn Sacramento MD, [facility]  10/24/2013, 6:01 PM   Treatments: See Above  Discharge Exam: Blood pressure 111/60, pulse 60, temperature 97.6 F (36.4 C), temperature source Oral, resp. rate 18, height _0   (1.626 m), weight 300 lb 1.6 oz (136.124 kg), SpO2 100.00%.   Disposition: 01-Home or Self Care      Discharge Orders   Future Orders Complete By Expires   Diet - low sodium heart healthy  As directed    Discharge instructions  As directed    Comments:     The patient was hospitalized form 3/2 to 10/28/13.   Increase activity slowly  As directed        Medication List         aspirin 81 MG tablet  Take 81 mg by mouth daily.     busPIRone 15 MG tablet  Commonly known as:  BUSPAR  Take 1 tablet by mouth 2 (two) times daily.     Calcium + D3 600-200 MG-UNIT Tabs  Take 1 tablet by mouth 2 (two) times daily.     carvedilol 12.5 MG tablet  Commonly known as:  COREG  Take 1 tablet (12.5 mg total) by mouth 2 (two) times daily before a meal.     EPINEPHrine 0.3 mg/0.3 mL Soaj injection  Commonly known as:  EPI-PEN  Inject 0.3 mg into the muscle once.     furosemide 40 MG tablet  Commonly known as:  LASIX  Take 1.5 tablets (60 mg total) by mouth 2 (two) times daily.     ibuprofen 200 MG tablet  Commonly known as:  ADVIL,MOTRIN  Take 200-600 mg by mouth 2 (two) times daily. 2 tablets in the morning and 1 tablet in the afternoon.     lisinopril 40 MG tablet  Commonly known as:  PRINIVIL,ZESTRIL  Take 1 tablet by mouth daily.     metFORMIN 1000 MG tablet  Commonly known as:  GLUCOPHAGE  Take 1 tablet by mouth 2 (two) times daily.     OVER THE COUNTER MEDICATION  Place 1 drop into both eyes daily as needed (dryness).     spironolactone 25 MG tablet  Commonly known as:  ALDACTONE  Take 0.5 tablets (12.5 mg total) by mouth 2 (two) times daily.       Follow-up Information   Follow up with Rozann Lesches, MD. (The office will call you with the appointment date and time.)    Specialty:  Cardiology   Contact information:   [address] [place] [place] [address] [phone]       Follow up with CLEGG,AMY, NP. (The office will call you with the appointment date and  time.)    Specialty:  Nurse Practitioner   Contact information:   [address] [place] [address] [phone]       Signed: Tarri Fuller 10/28/2013, 11:58 AM

## 2013-10-28 NOTE — Progress Notes (Signed)
Patient ID: Bridget Little, female   DOB: 1961-04-21, 53 y.o.   MRN: [id number]   Subjective:  Improving  Has scale at home   Nonischemic DCM EF 25% with elevated EDP at cath   Objective:   Vital Signs in the last 24 hours: Temp:  [97.6 F (36.4 C)-97.9 F (36.6 C)] 97.6 F (36.4 C) (03/08 0630) Pulse Rate:  [60-86] 60 (03/08 0630) Resp:  [18-20] 18 (03/08 0630) BP: (111-125)/(52-92) 111/60 mmHg (03/08 0630) SpO2:  [93 %-100 %] 100 % (03/08 0630) Weight:  [300 lb 1.6 oz (136.124 kg)] 300 lb 1.6 oz (136.124 kg) (03/08 0542)  Intake/Output from previous day: 03/07 0701 - 03/08 0700 In: 1182 [P.O.:1182] Out: 1500 [Urine:1500]  Medications: . aspirin EC  81 mg Oral Daily  . busPIRone  15 mg Oral BID  . carvedilol  12.5 mg Oral BID AC  . enoxaparin (LOVENOX) injection  40 mg Subcutaneous Q24H  . furosemide  60 mg Oral BID  . insulin aspart  0-20 Units Subcutaneous TID WC  . insulin aspart  0-5 Units Subcutaneous QHS  . lisinopril  40 mg Oral Daily  . sodium chloride  3 mL Intravenous Q12H  . spironolactone  12.5 mg Oral BID       Physical Exam:   General appearance: alert, cooperative and no distress Neck: no adenopathy, no JVD, supple, symmetrical, trachea midline and thyroid not enlarged, symmetric, no tenderness/mass/nodules Lungs: improved aeration; no rales Heart: regular rate and rhythm, S1, S2 normal, no murmur, click, rub or gallop Abdomen: soft, non-tender; bowel sounds normal; no masses,  no organomegaly Extremities: edema trace bilaterally, improved Neurologic: Grossly normal   Rate: 88  Rhythm: normal sinus rhythm  Lab Results:     Recent Labs  10/26/13 0410 10/27/13 0335  NA 145 144  K 3.8 4.3  CL 100 100  CO2 35* 33*  GLUCOSE 178* 179*  BUN 23 25*  CREATININE 0.88 0.88   No results found for this basename: INR,  in the last 72 hours BNP (last 3 results)  Recent Labs  10/22/13 0424 10/26/13 0400  PROBNP 642.6* 378.3*    Lipid  Panel     Component Value Date/Time   CHOL 192 10/23/2013 0534   TRIG 90 10/23/2013 0534   HDL 62 10/23/2013 0534   CHOLHDL 3.1 10/23/2013 0534   VLDL 18 10/23/2013 0534   LDLCALC 112* 10/23/2013 0534      Imaging:  No results found.    Assessment/Plan:   Principal Problem:   Acute systolic CHF (congestive heart failure) Active Problems:   Pulmonary edema   Morbid obesity   DM type 2 (diabetes mellitus, type 2)   HTN (hypertension), benign   Acute respiratory failure with hypoxia   Acute bronchitis   Elevated troponin   CHF:  On oral pills now  Ready for d/c  .  BMET is fine  Outpatient f/u with Dr Domenic Polite and CHF clinic    Jenkins Rouge

## 2013-10-29 ENCOUNTER — Telehealth (HOSPITAL_COMMUNITY): Payer: Self-pay | Admitting: Surgery

## 2013-10-29 NOTE — Telephone Encounter (Signed)
I called to speak with Bridget Little regarding her follow- up appointment and to see how she was doing since discharge.  She says that her weight is down 5 pounds and she has been feeling good.  She still has a non-productive cough but no other complaints.  Her follow-up appointment with AHF clinic is on November 01, 2013 at 3:30pm.

## 2013-11-01 ENCOUNTER — Inpatient Hospital Stay (HOSPITAL_COMMUNITY): Admission: RE | Admit: 2013-11-01 | Payer: BC Managed Care – PPO | Source: Ambulatory Visit

## 2013-11-02 ENCOUNTER — Telehealth (HOSPITAL_COMMUNITY): Payer: Self-pay | Admitting: Surgery

## 2013-11-02 NOTE — Telephone Encounter (Signed)
I called and spoke with Bridget Little again concerning her missed appointment yesterday and possibly rescheduling.  She says that she wants to follow-up with Dr. Domenic Polite in [place].  Appointment is for 11/05/2013 at 3:10pm.  She is aware and understands where to go for appointment.

## 2013-11-02 NOTE — Telephone Encounter (Signed)
I spoke with [name] about missing her appointment with the AHF clinic today.  She said that she had come to [facility] and had been unable to locate the AHF clinic. She was very frustrated and said they were at a restaurant eating and asked if I could call back tomorrow to reschedule appointment.  She also said that she really just wants to follow up in [place] with Dr. Domenic Polite.  I will call her back tomorrow to be sure she has an appointment for follow-up.

## 2013-11-05 ENCOUNTER — Encounter: Payer: Self-pay | Admitting: *Deleted

## 2013-11-05 ENCOUNTER — Encounter: Payer: Self-pay | Admitting: Adult Health

## 2013-11-05 ENCOUNTER — Ambulatory Visit (INDEPENDENT_AMBULATORY_CARE_PROVIDER_SITE_OTHER): Payer: BC Managed Care – PPO | Admitting: Adult Health

## 2013-11-05 VITALS — BP 139/79 | HR 84 | Ht 64.0 in | Wt 293.0 lb

## 2013-11-05 DIAGNOSIS — I509 Heart failure, unspecified: Secondary | ICD-10-CM

## 2013-11-05 DIAGNOSIS — I5021 Acute systolic (congestive) heart failure: Secondary | ICD-10-CM

## 2013-11-05 DIAGNOSIS — I10 Essential (primary) hypertension: Secondary | ICD-10-CM

## 2013-11-05 MED ORDER — CARVEDILOL 6.25 MG PO TABS: 6.2500 mg | ORAL_TABLET | Freq: Two times a day (BID) | ORAL | Status: DC

## 2013-11-05 NOTE — Assessment & Plan Note (Signed)
There is no evidence of fluid overload at this time. She continues to have fatigue and she is getting used to her medications. Heart rate is controlled, but blood pressure is higher than it should be in the setting of systolic dysfunction. She brings with her recordings of her home blood pressures, and they are running in the 655-374 systolic range.  I will titrate up her carvedilol to 18.75 mg twice a day. (She is on 25 mg one half tablet twice a day. I will add 6.25 mg, additional tablet, to be added to her current dosing). She will have close followup in one month with Dr. Harl Bowie.   I spent a great deal of time with this patient explaining her ejection fraction, recent for medication, daily weights, low-sodium diet, and need to increase her exercise slowly. She verbalizes understanding.

## 2013-11-05 NOTE — Progress Notes (Deleted)
Name: Bridget Little    DOB: 1961/01/12  Age: 53 y.o.  MR#: [id number]       PCP:  Chiquita Loth, MD      Insurance: Payor: [place] / Plan: [place] / Product Type: *No Product type* /   CC:    Chief Complaint  Patient presents with  . Congestive Heart Failure  . Hypertension    VS Filed Vitals:   11/05/13 1513  BP: 139/79  Pulse: 84  Height: 5\' 4"  (1.626 m)  Weight: 293 lb (132.904 kg)  SpO2: 97%    Weights Current Weight  11/05/13 293 lb (132.904 kg)  10/28/13 300 lb 1.6 oz (136.124 kg)  10/28/13 300 lb 1.6 oz (136.124 kg)    Blood Pressure  BP Readings from Last 3 Encounters:  11/05/13 139/79  10/28/13 111/60  10/28/13 111/60     Admit date:  (Not on file) Last encounter with RMR:  Visit date not found   Allergy Bee venom  Current Outpatient Prescriptions  Medication Sig Dispense Refill  . aspirin 81 MG tablet Take 81 mg by mouth daily.      . busPIRone (BUSPAR) 15 MG tablet Take 1 tablet by mouth 2 (two) times daily.      . Calcium Carb-Cholecalciferol (CALCIUM + D3) 600-200 MG-UNIT TABS Take 1 tablet by mouth 2 (two) times daily.      . carvedilol (COREG) 12.5 MG tablet Take 1 tablet (12.5 mg total) by mouth 2 (two) times daily before a meal.  60 tablet  5  . EPINEPHrine (EPI-PEN) 0.3 mg/0.3 mL SOAJ injection Inject 0.3 mg into the muscle once.      . furosemide (LASIX) 40 MG tablet Take 1.5 tablets (60 mg total) by mouth 2 (two) times daily.  90 tablet  5  . ibuprofen (ADVIL,MOTRIN) 200 MG tablet Take 200-600 mg by mouth 2 (two) times daily. 2 tablets in the morning and 1 tablet in the afternoon.      Marland Kitchen lisinopril (PRINIVIL,ZESTRIL) 40 MG tablet Take 1 tablet by mouth daily.      . metFORMIN (GLUCOPHAGE) 1000 MG tablet Take 1 tablet by mouth 2 (two) times daily.      Marland Kitchen OVER THE COUNTER MEDICATION Place 1 drop into both eyes daily as needed (dryness).      Marland Kitchen spironolactone (ALDACTONE) 25 MG tablet Take 0.5 tablets (12.5 mg total)  by mouth 2 (two) times daily.  30 tablet  5   No current facility-administered medications for this visit.    Discontinued Meds:   There are no discontinued medications.  Patient Active Problem List   Diagnosis Date Noted  . Acute systolic CHF (congestive heart failure) 10/24/2013  . Pulmonary edema 10/22/2013  . Morbid obesity 10/22/2013  . DM type 2 (diabetes mellitus, type 2) 10/22/2013  . HTN (hypertension), benign 10/22/2013  . Acute respiratory failure with hypoxia 10/22/2013  . Acute bronchitis 10/22/2013  . Elevated troponin 10/22/2013    LABS    Component Value Date/Time   NA 144 10/27/2013 0335   NA 145 10/26/2013 0410   NA 144 10/25/2013 1025   K 4.3 10/27/2013 0335   K 3.8 10/26/2013 0410   K 4.1 10/25/2013 1025   CL 100 10/27/2013 0335   CL 100 10/26/2013 0410   CL 99 10/25/2013 1025   CO2 33* 10/27/2013 0335   CO2 35* 10/26/2013 0410   CO2 34* 10/25/2013 1025   GLUCOSE 179* 10/27/2013 0335  GLUCOSE 178* 10/26/2013 0410   GLUCOSE 203* 10/25/2013 1025   BUN 25* 10/27/2013 0335   BUN 23 10/26/2013 0410   BUN 21 10/25/2013 1025   CREATININE 0.88 10/27/2013 0335   CREATININE 0.88 10/26/2013 0410   CREATININE 0.80 10/25/2013 1025   CALCIUM 9.2 10/27/2013 0335   CALCIUM 9.3 10/26/2013 0410   CALCIUM 9.6 10/25/2013 1025   GFRNONAA 74* 10/27/2013 0335   GFRNONAA 74* 10/26/2013 0410   GFRNONAA 83* 10/25/2013 1025   GFRAA 85* 10/27/2013 0335   GFRAA 85* 10/26/2013 0410   GFRAA >90 10/25/2013 1025   CMP     Component Value Date/Time   NA 144 10/27/2013 0335   K 4.3 10/27/2013 0335   CL 100 10/27/2013 0335   CO2 33* 10/27/2013 0335   GLUCOSE 179* 10/27/2013 0335   BUN 25* 10/27/2013 0335   CREATININE 0.88 10/27/2013 0335   CALCIUM 9.2 10/27/2013 0335   PROT 6.8 10/23/2013 0534   ALBUMIN 3.0* 10/23/2013 0534   AST 16 10/23/2013 0534   ALT 21 10/23/2013 0534   ALKPHOS 92 10/23/2013 0534   BILITOT 0.6 10/23/2013 0534   GFRNONAA 74* 10/27/2013 0335   GFRAA 85* 10/27/2013 0335       Component Value Date/Time   WBC 13.8* 10/24/2013 1919    WBC 18.4* 10/23/2013 0534   WBC 11.7* 10/22/2013 0424   HGB 14.7 10/24/2013 1919   HGB 12.8 10/23/2013 0534   HGB 12.9 10/22/2013 0424   HCT 44.1 10/24/2013 1919   HCT 40.3 10/23/2013 0534   HCT 41.5 10/22/2013 0424   MCV 78.6 10/24/2013 1919   MCV 79.3 10/23/2013 0534   MCV 81.5 10/22/2013 0424    Lipid Panel     Component Value Date/Time   CHOL 192 10/23/2013 0534   TRIG 90 10/23/2013 0534   HDL 62 10/23/2013 0534   CHOLHDL 3.1 10/23/2013 0534   VLDL 18 10/23/2013 0534   LDLCALC 112* 10/23/2013 0534    ABG No results found for this basename: phart, pco2, pco2art, po2, po2art, hco3, tco2, acidbasedef, o2sat     Lab Results  Component Value Date   TSH 2.752 10/22/2013   BNP (last 3 results)  Recent Labs  10/22/13 0424 10/26/13 0400  PROBNP 642.6* 378.3*   Cardiac Panel (last 3 results) No results found for this basename: CKTOTAL, CKMB, TROPONINI, RELINDX,  in the last 72 hours  Iron/TIBC/Ferritin No results found for this basename: iron, tibc, ferritin     EKG Orders placed during the hospital encounter of 10/22/13  . EKG 12-LEAD  . EKG 12-LEAD  . EKG 12-LEAD  . EKG  . EKG 12-LEAD  . EKG 12-LEAD  . EKG 12-LEAD     Prior Assessment and Plan Problem List as of 11/05/2013     Cardiovascular and Mediastinum   HTN (hypertension), benign   Acute systolic CHF (congestive heart failure)     Respiratory   Pulmonary edema   Acute respiratory failure with hypoxia   Acute bronchitis     Endocrine   DM type 2 (diabetes mellitus, type 2)     Other   Morbid obesity   Elevated troponin       Imaging: Dg Chest 2 View  10/22/2013   CLINICAL DATA:  Shortness of breath and wheezing  EXAM: CHEST  2 VIEW  COMPARISON:  None.  FINDINGS: Bilateral interstitial and airspace abnormality. There is asymmetry, especially of the airspace opacification which is asymmetrically increased in the peripheral left mid lung.  Fissural thickening present. No significant pleural effusion. Cardiomegaly and vascular  pedicle widening. No pneumothorax. No acute osseous findings.  IMPRESSION: Bilateral pulmonary abnormality which could represent pulmonary edema or multi focal infection.   Electronically Signed   By: Jorje Guild M.D.   On: 10/22/2013 05:26   Ct Angio Chest Pe W/cm &/or Wo Cm  10/22/2013   CLINICAL DATA:  Progressive lower extremity swelling with sudden onset of dyspnea and wheezing.  EXAM: CT ANGIOGRAPHY CHEST WITH CONTRAST  TECHNIQUE: Multidetector CT imaging of the chest was performed using the standard protocol during bolus administration of intravenous contrast. Multiplanar CT image reconstructions and MIPs were obtained to evaluate the vascular anatomy. The study is limited due to the patient's body habitus.  CONTRAST:  164mL OMNIPAQUE IOHEXOL 350 MG/ML SOLN  COMPARISON:  DG CHEST 2 VIEW dated 10/22/2013  FINDINGS: Contrast within the pulmonary arterial tree is normal in appearance. There are no filling defects to suggest an acute pulmonary embolism. The caliber of the thoracic aorta is normal. There is no evidence of a false lumen. The cardiac chambers are enlarged. There is no pericardial or pleural effusion. There is mild engorgement of the azygos vein. The inferior vena cava appears engorged. No mediastinal or hilar lymphadenopathy is demonstrated. The retrosternal soft tissues appear normal. The thoracic esophagus is not abnormally distended. There is marked enlargement of the left thyroid lobe which is only partially included in the field of view.  At lung window settings there are patchy alveolar densities bilaterally. These are most conspicuous in but are not confined to the lower lobes. At bone window settings the thoracic spine and sternum exhibit no acute abnormalities. The visualized portions of the ribs also exhibit no acute abnormalities.  Within the upper abdomen the observed portions of the liver and spleen appear normal.  Review of the MIP images confirms the above findings.  IMPRESSION:  1. There is no evidence of an acute pulmonary embolism or acute thoracic aortic pathology. 2. The findings likely reflect congestive heart failure with pulmonary interstitial and alveolar edema. Certainly bilateral pneumonia cannot be absolutely excluded. 3. There is no pleural nor pericardial effusion. 4. There is marked enlargement of the left thyroid lobe that is only partially included in the field of view.   Electronically Signed   By: David  Martinique   On: 10/22/2013 18:42

## 2013-11-05 NOTE — Progress Notes (Signed)
HPI: [name] is a 53 year old patient of Dr. Domenic Polite we are following for ongoing assessment and management of acute on chronic to talk CHF, with history of morbid obesity, nonischemic cardiomyopathy, hypertension, and non-ST elevation MI requiring hospitalization in March of 2015.    She was initially seen by Dr. Domenic Polite in consultation while admitted to [facility]. She was treated initially for CHF and pulmonary edema she was diuresis using IV Lasix, treated for pneumonia with antibiotics and steroids, echocardiogram demonstrating an EF of 25-30% with grade 1 diastolic dysfunction.   The patient had cardiac catheterization on 10/24/2013, both right and left. Angiography revealed normal coronary arteries, with severely reduced LV systolic function with an EF of 25-30% due to nonischemic cardiomyopathy with moderately elevated LV end-diastolic pressure. She was recommended for continued diuresis and medical management of nonischemic cardiomyopathy.   She was sent home on Lasix 40 mg 1.5 mg twice a day, lisinopril 40 mg daily, spironolactone 12.5 mg twice a day, and aspirin. Discharge weight was 300 pounds.   She has lost 7 lbs since discharge. She is weighing daily and avoiding salt. She is medically compliant. She feels tired but tries to stay active.Denies chest pain or palpitations.   Allergies  Allergen Reactions  . Bee Venom Anaphylaxis and Hives    Current Outpatient Prescriptions  Medication Sig Dispense Refill  . aspirin 81 MG tablet Take 81 mg by mouth daily.      . busPIRone (BUSPAR) 15 MG tablet Take 1 tablet by mouth 2 (two) times daily.      . Calcium Carb-Cholecalciferol (CALCIUM + D3) 600-200 MG-UNIT TABS Take 1 tablet by mouth 2 (two) times daily.      . carvedilol (COREG) 12.5 MG tablet Take 1 tablet (12.5 mg total) by mouth 2 (two) times daily before a meal.  60 tablet  5  . EPINEPHrine (EPI-PEN) 0.3 mg/0.3 mL SOAJ injection Inject 0.3 mg into the muscle once.       . furosemide (LASIX) 40 MG tablet Take 1.5 tablets (60 mg total) by mouth 2 (two) times daily.  90 tablet  5  . ibuprofen (ADVIL,MOTRIN) 200 MG tablet Take 200-600 mg by mouth 2 (two) times daily. 2 tablets in the morning and 1 tablet in the afternoon.      Marland Kitchen lisinopril (PRINIVIL,ZESTRIL) 40 MG tablet Take 1 tablet by mouth daily.      . metFORMIN (GLUCOPHAGE) 1000 MG tablet Take 1 tablet by mouth 2 (two) times daily.      Marland Kitchen OVER THE COUNTER MEDICATION Place 1 drop into both eyes daily as needed (dryness).      Marland Kitchen spironolactone (ALDACTONE) 25 MG tablet Take 0.5 tablets (12.5 mg total) by mouth 2 (two) times daily.  30 tablet  5   No current facility-administered medications for this visit.    Past Medical History  Diagnosis Date  . Essential hypertension, benign   . Hypercholesteremia   . Chronic back pain   . Neuromuscular disorder     Numbness in right hand and left foot    Past Surgical History  Procedure Laterality Date  . Tubal ligation    . Colonoscopy  06/02/2012    Procedure: COLONOSCOPY;  Surgeon: Danie Binder, MD;  Location: [facility];  Service: Endoscopy;  Laterality: N/A;  8:30 AM  . Back surgery      Lumbar fusion    ROS: Review of systems complete and found to be negative unless listed above  PHYSICAL EXAM BP 139/79  Pulse 84  Ht 5\' 4"  (1.626 m)  Wt 293 lb (132.904 kg)  BMI 50.27 kg/m2  SpO2 97%  General: Well developed, well nourished, in no acute distress, obese. Head: Eyes PERRLA, No xanthomas.   Normal cephalic and atramatic  Lungs: Clear bilaterally to auscultation and percussion. Heart: HRRR S1 S2, without MRG.  Pulses are 2+ & equal.            No carotid bruit. No JVD.  No abdominal bruits. No femoral bruits. Abdomen: Bowel sounds are positive, abdomen soft and non-tender without masses or                  Hernia's noted. Msk:  Back normal, normal gait. Normal strength and tone for age. Extremities: No clubbing, cyanosis or edema.  DP  +1 Neuro: Alert and oriented X 3. Psych:  Good affect, responds appropriately    ASSESSMENT AND PLAN

## 2013-11-05 NOTE — Assessment & Plan Note (Signed)
Slightly elevated today. As stated blood pressure recordings at home also have her blood pressures in the 370 488 systolic range. This is too high for a patient with a systolic function of 89-16%. As stated carvedilol dose has increased on this visit

## 2013-11-05 NOTE — Patient Instructions (Signed)
Your physician recommends that you schedule a follow-up appointment in: 1 month with Dr Harl Bowie   Your physician has recommended you make the following change in your medication:  Coreg 12.5 twice a day ADD Coreg 6.25 mg twice a day

## 2013-11-12 ENCOUNTER — Telehealth: Payer: Self-pay | Admitting: Cardiology

## 2013-11-12 NOTE — Telephone Encounter (Signed)
Patient needs to discuss medication dosages / tgs

## 2013-11-13 NOTE — Telephone Encounter (Signed)
Left message to call back  

## 2013-11-13 NOTE — Telephone Encounter (Signed)
Pt called and and clarified Coreg from last office visit.

## 2013-11-20 NOTE — Telephone Encounter (Signed)
Opened in error

## 2013-12-10 ENCOUNTER — Encounter (INDEPENDENT_AMBULATORY_CARE_PROVIDER_SITE_OTHER): Payer: Self-pay | Admitting: Ophthalmology

## 2013-12-17 ENCOUNTER — Ambulatory Visit (INDEPENDENT_AMBULATORY_CARE_PROVIDER_SITE_OTHER): Payer: BC Managed Care – PPO | Admitting: Cardiology

## 2013-12-17 ENCOUNTER — Encounter: Payer: Self-pay | Admitting: Cardiology

## 2013-12-17 VITALS — BP 124/72 | HR 83 | Ht 64.0 in | Wt 268.0 lb

## 2013-12-17 DIAGNOSIS — I509 Heart failure, unspecified: Secondary | ICD-10-CM

## 2013-12-17 MED ORDER — CARVEDILOL 25 MG PO TABS: 25.0000 mg | ORAL_TABLET | Freq: Two times a day (BID) | ORAL | Status: DC

## 2013-12-17 NOTE — Progress Notes (Signed)
Clinical Summary Ms. Bojarski is a 53 y.o.female seen today for follow up of the following medical problems.   1. NICM - new diagnosis made during admission 10/2013 - 10/2013 echo LVEF 25-05%, grade I diastolic dysfunction - cath 10/2013 with patent coronaries,  - last visit NP Lawrence increased her coreg to 18.75mg  bid  - denies any SOB or DOE. Reports her energy level is increasing. Fairly sedentary lifestyle but has not noticed any significant symptoms. Exertion somewhat limited by chronic back pain. - no orthopnea, no PND, no LE edema - limiting Na, avoiding NSAIDs.  - compliant with meds, last visit coreg 18.75 mg bid. Denies any side effects - weights herself every morning, stable.  2. HTN - checks bp twice a day, typically around 100s/60s.    3. OSA? - previous snoring, + apneic episodes. No daytime somnolence.     Past Medical History  Diagnosis Date  . Essential hypertension, benign   . Hypercholesteremia   . Chronic back pain   . Neuromuscular disorder     Numbness in right hand and left foot     Allergies  Allergen Reactions  . Bee Venom Anaphylaxis and Hives     Current Outpatient Prescriptions  Medication Sig Dispense Refill  . aspirin 81 MG tablet Take 81 mg by mouth daily.      . busPIRone (BUSPAR) 15 MG tablet Take 1 tablet by mouth 2 (two) times daily.      . Calcium Carb-Cholecalciferol (CALCIUM + D3) 600-200 MG-UNIT TABS Take 1 tablet by mouth 2 (two) times daily.      . carvedilol (COREG) 12.5 MG tablet Take 1 tablet (12.5 mg total) by mouth 2 (two) times daily before a meal.  60 tablet  5  . carvedilol (COREG) 6.25 MG tablet Take 1 tablet (6.25 mg total) by mouth 2 (two) times daily.  60 tablet  0  . EPINEPHrine (EPI-PEN) 0.3 mg/0.3 mL SOAJ injection Inject 0.3 mg into the muscle once.      . furosemide (LASIX) 40 MG tablet Take 1.5 tablets (60 mg total) by mouth 2 (two) times daily.  90 tablet  5  . ibuprofen (ADVIL,MOTRIN) 200 MG tablet Take  200-600 mg by mouth 2 (two) times daily. 2 tablets in the morning and 1 tablet in the afternoon.      Marland Kitchen lisinopril (PRINIVIL,ZESTRIL) 40 MG tablet Take 1 tablet by mouth daily.      . metFORMIN (GLUCOPHAGE) 1000 MG tablet Take 1 tablet by mouth 2 (two) times daily.      Marland Kitchen OVER THE COUNTER MEDICATION Place 1 drop into both eyes daily as needed (dryness).      Marland Kitchen spironolactone (ALDACTONE) 25 MG tablet Take 0.5 tablets (12.5 mg total) by mouth 2 (two) times daily.  30 tablet  5   No current facility-administered medications for this visit.     Past Surgical History  Procedure Laterality Date  . Tubal ligation    . Colonoscopy  06/02/2012    Procedure: COLONOSCOPY;  Surgeon: Danie Binder, MD;  Location: [facility];  Service: Endoscopy;  Laterality: N/A;  8:30 AM  . Back surgery      Lumbar fusion     Allergies  Allergen Reactions  . Bee Venom Anaphylaxis and Hives      Family History  Problem Relation Age of Onset  . Colon cancer Neg Hx   . Prostate cancer Father      Social History Ms. Mcnicholas  reports that she has never smoked. She does not have any smokeless tobacco history on file. Ms. Saxton reports that she drinks alcohol.   Review of Systems CONSTITUTIONAL: No weight loss, fever, chills, weakness or fatigue.  HEENT: Eyes: No visual loss, blurred vision, double vision or yellow sclerae.No hearing loss, sneezing, congestion, runny nose or sore throat.  SKIN: No rash or itching.  CARDIOVASCULAR: per HPI RESPIRATORY: No shortness of breath, cough or sputum.  GASTROINTESTINAL: No anorexia, nausea, vomiting or diarrhea. No abdominal pain or blood.  GENITOURINARY: No burning on urination, no polyuria NEUROLOGICAL: No headache, dizziness, syncope, paralysis, ataxia, numbness or tingling in the extremities. No change in bowel or bladder control.  MUSCULOSKELETAL: No muscle, back pain, joint pain or stiffness.  LYMPHATICS: No enlarged nodes. No history of splenectomy.    PSYCHIATRIC: No history of depression or anxiety.  ENDOCRINOLOGIC: No reports of sweating, cold or heat intolerance. No polyuria or polydipsia.  Marland Kitchen   Physical Examination There were no vitals filed for this visit. There were no vitals filed for this visit.  Gen: resting comfortably, no acute distress HEENT: no scleral icterus, pupils equal round and reactive, no palptable cervical adenopathy,  CV Resp: Clear to auscultation bilaterally GI: abdomen is soft, non-tender, non-distended, normal bowel sounds, no hepatosplenomegaly MSK: extremities are warm, no edema.  Skin: warm, no rash Neuro:  no focal deficits Psych: appropriate affect   Diagnostic Studies 10/2013 Echo Study Conclusions  - Left ventricle: The cavity size was normal. Wall thickness was increased in a pattern of mild LVH. Systolic function was severely reduced. The estimated ejection fraction was in the range of 25% to 30%. Possible severe hypokinesis of the anteroseptal myocardium. Doppler parameters are consistent with abnormal left ventricular relaxation (grade 1 diastolic dysfunction). - Aortic valve: Poorly visualized. Mildly calcified annulus. Probably trileaflet. No significant regurgitation. - Right ventricle: Systolic function was reduced. - Right atrium: Central venous pressure: 37mm Hg (est). - Tricuspid valve: Physiologic regurgitation. - Pulmonary arteries: Systolic pressure could not be accurately estimated. - Pericardium, extracardiac: A prominent pericardial fat pad was present. Impressions:  - Extremely limited study. There is mild LVH, overall normal LV chamber size, LVEF roughly estimated at 25-30% based on available images. There appears to be significant anteroseptal hypokinesis based on some views. Optison contrast may provide better images. There is grade 1 diastolic dysfunction. RV contraction is abnormal as well. No significant degree of mitral or tricuspid regurgitation noted,  cannotassess PASP. CVP appears normal. Epicardial fat pad noted.  10/2013 Cath Procedural Findings:  Hemodynamics:  AO: 172/87 mmHg  LV: 165/20 mmHg  LVEDP: 25 mmHg  Coronary angiography:  Coronary dominance: right  Left Main: normal  Left Anterior Descending (LAD): Normal in size with no significant disease.  1st diagonal (D1): Normal in size with no significant disease.  2nd diagonal (D2): Large in size with no significant disease.  3rd diagonal (D3): Normal in size with no significant disease.  Circumflex (LCx): Normal in size and nondominant the vessel is free of any significant disease.  1st obtuse marginal: Small in size with minor irregularities.  2nd obtuse marginal: Normal in size with no significant disease.  3rd obtuse marginal: Normal in size with no significant disease  Right Coronary Artery: normal in size and dominant the vessel is free of any significant disease.  Posterior descending artery: large in size with no significant disease  Posterior AV segment: normal in size with no significant disease.  Posterolateral branchs: 3 normal size branchs which  are free of significant disease. Left ventriculography: Left ventricular systolic function is severely reduced , LVEF is estimated at 25-30 %, there is no significant mitral regurgitation  Final Conclusions:  1. Normal coronary arteries.  2. Severely reduced LV systolic function with an ejection fraction of 25-30% due to nonischemic cardiomyopathy.  3. Moderately elevated left ventricular end-diastolic pressure  Recommendations:  Continue diuresis. Treat medically for nonischemic cardiomyopathy. Blood pressure control is recommended. The patient should be screened for sleep apnea and treated as indicated.     Assessment and Plan   1. NICM - LVEF 25-30% by echo 10/2013, NYHA II, she does not have an ICD at this time as she has just recently been diagnosed and is undergoing a trial of medical therapy - increase coreg  to 25mg  bid - f/u in 3 weeks, she will need a repeat echo around June or July to reeval her LVEF and be considered for ICD  2. HTN - at goal, continue current meds  3. OSA? -several risk factors as well as signs/symptoms for OSA, will refer for sleep study     Arnoldo Lenis, M.D., F.A.C.C.

## 2013-12-17 NOTE — Patient Instructions (Addendum)
Your physician recommends that you schedule a follow-up appointment in: 3 weeks   Your physician has recommended that you have a sleep study. This test records several body functions during sleep, including: brain activity, eye movement, oxygen and carbon dioxide blood levels, heart rate and rhythm, breathing rate and rhythm, the flow of air through your mouth and nose, snoring, body muscle movements, and chest and belly movement.    Your physician has recommended you make the following change in your medication:   INCREASE Coreg to 25 mg twice a day     Thank you for choosing [facility] !

## 2013-12-24 ENCOUNTER — Encounter (INDEPENDENT_AMBULATORY_CARE_PROVIDER_SITE_OTHER): Payer: Self-pay | Admitting: Ophthalmology

## 2013-12-28 ENCOUNTER — Telehealth: Payer: Self-pay | Admitting: *Deleted

## 2013-12-28 NOTE — Telephone Encounter (Signed)
Pt states her BP has been dropping in the afternoon because of increase in medications

## 2013-12-28 NOTE — Telephone Encounter (Signed)
Pt reported feeling Coreg "kick in" after evening dose and she feels she is in "slow motion" .Her BP was 115/60 states HR was 77  She denies dizziness. I told her to take medication before she went to bed to see if this changes anything

## 2013-12-31 NOTE — Telephone Encounter (Signed)
This is expected with medication changes. She is to give this time for her body to get used to doses. Taking second dose at night is a good option

## 2013-12-31 NOTE — Telephone Encounter (Signed)
Spoke with pt,she is feeling much better,sx's resolved

## 2014-01-06 NOTE — Progress Notes (Signed)
Clinical Summary Ms. Stambaugh is a 53 y.o.female  1. NICM  - new diagnosis made during admission 10/2013  - 10/2013 echo LVEF 47-82%, grade I diastolic dysfunction  - cath 10/2013 with patent coronaries,  - denies any SOB or DOE. No orthopnea, no PND, no LE edema  - limiting Na, avoiding NSAIDs.  - weights herself every morning, stable in the low 290s  - last visit increased coreg to 25mg  bid. Her 6pm dose makes her fatigued and dizzy, she has cut it in half and takes 12.5 at 5pm and 12.5 at 6pm.    2. HTN  - checks bp twice a day, typically around 100s/60s, though often some lower pressures at night.   3. OSA?  - previous snoring, + apneic episodes. No daytime somnolence.  - sleep study ordered at last visit, it is scheduled for Friday   Past Medical History  Diagnosis Date  . Essential hypertension, benign   . Hypercholesteremia   . Chronic back pain   . Neuromuscular disorder     Numbness in right hand and left foot     Allergies  Allergen Reactions  . Bee Venom Anaphylaxis and Hives     Current Outpatient Prescriptions  Medication Sig Dispense Refill  . aspirin 81 MG tablet Take 81 mg by mouth daily.      Marland Kitchen atorvastatin (LIPITOR) 20 MG tablet       . busPIRone (BUSPAR) 15 MG tablet Take 1 tablet by mouth 2 (two) times daily.      . Calcium Carb-Cholecalciferol (CALCIUM + D3) 600-200 MG-UNIT TABS Take 1 tablet by mouth 2 (two) times daily.      . carvedilol (COREG) 25 MG tablet Take 1 tablet (25 mg total) by mouth 2 (two) times daily.  180 tablet  3  . EPINEPHrine (EPI-PEN) 0.3 mg/0.3 mL SOAJ injection Inject 0.3 mg into the muscle once.      . furosemide (LASIX) 40 MG tablet Take 1.5 tablets (60 mg total) by mouth 2 (two) times daily.  90 tablet  5  . lisinopril (PRINIVIL,ZESTRIL) 40 MG tablet Take 1 tablet by mouth daily.      . metFORMIN (GLUCOPHAGE) 1000 MG tablet Take 1 tablet by mouth 2 (two) times daily.      Marland Kitchen OVER THE COUNTER MEDICATION Place 1 drop  into both eyes daily as needed (dryness).      Marland Kitchen spironolactone (ALDACTONE) 25 MG tablet Take 0.5 tablets (12.5 mg total) by mouth 2 (two) times daily.  30 tablet  5   No current facility-administered medications for this visit.     Past Surgical History  Procedure Laterality Date  . Tubal ligation    . Colonoscopy  06/02/2012    Procedure: COLONOSCOPY;  Surgeon: Danie Binder, MD;  Location: [facility];  Service: Endoscopy;  Laterality: N/A;  8:30 AM  . Back surgery      Lumbar fusion     Allergies  Allergen Reactions  . Bee Venom Anaphylaxis and Hives      Family History  Problem Relation Age of Onset  . Colon cancer Neg Hx   . Prostate cancer Father      Social History Ms. Brucker reports that she has never smoked. She does not have any smokeless tobacco history on file. Ms. Frink reports that she drinks alcohol.   Review of Systems CONSTITUTIONAL: No weight loss, fever, chills, weakness or fatigue.  HEENT: Eyes: No visual loss, blurred vision, double  vision or yellow sclerae.No hearing loss, sneezing, congestion, runny nose or sore throat.  SKIN: No rash or itching.  CARDIOVASCULAR: per HPI RESPIRATORY: No shortness of breath, cough or sputum.  GASTROINTESTINAL: No anorexia, nausea, vomiting or diarrhea. No abdominal pain or blood.  GENITOURINARY: No burning on urination, no polyuria NEUROLOGICAL: No headache, dizziness, syncope, paralysis, ataxia, numbness or tingling in the extremities. No change in bowel or bladder control.  MUSCULOSKELETAL: No muscle, back pain, joint pain or stiffness.  LYMPHATICS: No enlarged nodes. No history of splenectomy.  PSYCHIATRIC: No history of depression or anxiety.  ENDOCRINOLOGIC: No reports of sweating, cold or heat intolerance. No polyuria or polydipsia.  Marland Kitchen   Physical Examination p 66 bp 121/50 Wt 294 lbs BMI 50 Gen: resting comfortably, no acute distress HEENT: no scleral icterus, pupils equal round and reactive, no  palptable cervical adenopathy,  CV: RRR, no m/r/g, no JVD, no carotid bruits Resp: Clear to auscultation bilaterally GI: abdomen is soft, non-tender, non-distended, normal bowel sounds, no hepatosplenomegaly MSK: extremities are warm, no edema.  Skin: warm, no rash Neuro:  no focal deficits Psych: appropriate affect   Diagnostic Studies 10/2013 Echo  Study Conclusions  - Left ventricle: The cavity size was normal. Wall thickness was increased in a pattern of mild LVH. Systolic function was severely reduced. The estimated ejection fraction was in the range of 25% to 30%. Possible severe hypokinesis of the anteroseptal myocardium. Doppler parameters are consistent with abnormal left ventricular relaxation (grade 1 diastolic dysfunction). - Aortic valve: Poorly visualized. Mildly calcified annulus. Probably trileaflet. No significant regurgitation. - Right ventricle: Systolic function was reduced. - Right atrium: Central venous pressure: 60mm Hg (est). - Tricuspid valve: Physiologic regurgitation. - Pulmonary arteries: Systolic pressure could not be accurately estimated. - Pericardium, extracardiac: A prominent pericardial fat pad was present. Impressions:  - Extremely limited study. There is mild LVH, overall normal LV chamber size, LVEF roughly estimated at 25-30% based on available images. There appears to be significant anteroseptal hypokinesis based on some views. Optison contrast may provide better images. There is grade 1 diastolic dysfunction. RV contraction is abnormal as well. No significant degree of mitral or tricuspid regurgitation noted, cannotassess PASP. CVP appears normal. Epicardial fat pad noted.  10/2013 Cath  Procedural Findings:  Hemodynamics:  AO: 172/87 mmHg  LV: 165/20 mmHg  LVEDP: 25 mmHg  Coronary angiography:  Coronary dominance: right  Left Main: normal  Left Anterior Descending (LAD): Normal in size with no significant disease.  1st diagonal  (D1): Normal in size with no significant disease.  2nd diagonal (D2): Large in size with no significant disease.  3rd diagonal (D3): Normal in size with no significant disease.  Circumflex (LCx): Normal in size and nondominant the vessel is free of any significant disease.  1st obtuse marginal: Small in size with minor irregularities.  2nd obtuse marginal: Normal in size with no significant disease.  3rd obtuse marginal: Normal in size with no significant disease  Right Coronary Artery: normal in size and dominant the vessel is free of any significant disease.  Posterior descending artery: large in size with no significant disease  Posterior AV segment: normal in size with no significant disease.  Posterolateral branchs: 3 normal size branchs which are free of significant disease. Left ventriculography: Left ventricular systolic function is severely reduced , LVEF is estimated at 25-30 %, there is no significant mitral regurgitation  Final Conclusions:  1. Normal coronary arteries.  2. Severely reduced LV systolic function with an ejection  fraction of 25-30% due to nonischemic cardiomyopathy.  3. Moderately elevated left ventricular end-diastolic pressure  Recommendations:  Continue diuresis. Treat medically for nonischemic cardiomyopathy. Blood pressure control is recommended. The patient should be screened for sleep apnea and treated as indicated.     Assessment and Plan  1. NICM  - LVEF 25-30% by echo 10/2013, NYHA II, she does not have an ICD at this time as she has just recently been diagnosed and is undergoing a trial of medical therapy  - she has reached her goal dosing of coreg, lisinopril, and aldactone. Had some initial side effects to last coreg increase but is now tolerating -  repeat echo around June or July to reeval her LVEF and be considered for ICD   2. HTN  - at goal, continue current meds   3. OSA?  -sleep test this Friday   F/u late June or early July of this  year, will order echo at that time    Arnoldo Lenis, M.D., F.A.C.C.

## 2014-01-07 ENCOUNTER — Encounter: Payer: Self-pay | Admitting: Cardiology

## 2014-01-07 ENCOUNTER — Ambulatory Visit (INDEPENDENT_AMBULATORY_CARE_PROVIDER_SITE_OTHER): Payer: BC Managed Care – PPO | Admitting: Cardiology

## 2014-01-07 VITALS — BP 121/50 | HR 66 | Ht 64.0 in | Wt 294.0 lb

## 2014-01-07 DIAGNOSIS — I5022 Chronic systolic (congestive) heart failure: Secondary | ICD-10-CM

## 2014-01-07 DIAGNOSIS — I10 Essential (primary) hypertension: Secondary | ICD-10-CM

## 2014-01-07 NOTE — Patient Instructions (Signed)
Your physician recommends that you schedule a follow-up appointment in: end of June or early July    Your physician recommends that you continue on your current medications as directed. Please refer to the Current Medication list given to you today.    Thank you for choosing [facility] !

## 2014-01-11 ENCOUNTER — Ambulatory Visit: Payer: BC Managed Care – PPO | Attending: Cardiology | Admitting: Sleep Medicine

## 2014-01-11 VITALS — Ht 64.0 in | Wt 268.0 lb

## 2014-01-11 DIAGNOSIS — Z79899 Other long term (current) drug therapy: Secondary | ICD-10-CM | POA: Insufficient documentation

## 2014-01-11 DIAGNOSIS — G4733 Obstructive sleep apnea (adult) (pediatric): Secondary | ICD-10-CM | POA: Insufficient documentation

## 2014-01-11 DIAGNOSIS — I509 Heart failure, unspecified: Secondary | ICD-10-CM

## 2014-01-11 DIAGNOSIS — Z7982 Long term (current) use of aspirin: Secondary | ICD-10-CM | POA: Insufficient documentation

## 2014-01-21 NOTE — Sleep Study (Signed)
  [place] A. Merlene Laughter, MD     [email or web address]        NOCTURNAL POLYSOMNOGRAM    LOCATION: SLEEP LAB FACILITY: [facility]   PHYSICIAN: Ellyana Crigler A. Merlene Laughter, M.D.   DATE OF STUDY: 01/11/2014.   REFERRING PHYSICIAN: Carlyle Dolly.  INDICATIONS: This is a 53 year old presents with hypersomnia, obesity and snoring.  MEDICATIONS:  Prior to Admission medications   Medication Sig Start Date End Date Taking? Authorizing Provider  aspirin 81 MG tablet Take 81 mg by mouth daily.    Historical Provider, MD  atorvastatin (LIPITOR) 20 MG tablet Take 20 mg by mouth daily at 6 PM.  11/27/13   Historical Provider, MD  busPIRone (BUSPAR) 15 MG tablet Take 1 tablet by mouth 2 (two) times daily. 09/03/13   Historical Provider, MD  Calcium Carb-Cholecalciferol (CALCIUM + D3) 600-200 MG-UNIT TABS Take 1 tablet by mouth 2 (two) times daily.    Historical Provider, MD  carvedilol (COREG) 25 MG tablet Take 1 tablet (25 mg total) by mouth 2 (two) times daily. 12/17/13   Arnoldo Lenis, MD  EPINEPHrine (EPI-PEN) 0.3 mg/0.3 mL SOAJ injection Inject 0.3 mg into the muscle once.    Historical Provider, MD  furosemide (LASIX) 40 MG tablet Take 1.5 tablets (60 mg total) by mouth 2 (two) times daily. 10/28/13   Tarri Fuller, PA-C  lisinopril (PRINIVIL,ZESTRIL) 40 MG tablet Take 1 tablet by mouth daily. 08/02/13   Historical Provider, MD  metFORMIN (GLUCOPHAGE) 1000 MG tablet Take 1 tablet by mouth 2 (two) times daily. 09/05/13   Historical Provider, MD  OVER THE COUNTER MEDICATION Place 1 drop into both eyes daily as needed (dryness).    Historical Provider, MD  spironolactone (ALDACTONE) 25 MG tablet Take 0.5 tablets (12.5 mg total) by mouth 2 (two) times daily. 10/28/13   Tarri Fuller, PA-C      EPWORTH SLEEPINESS SCALE: 13.   BMI: 46.   ARCHITECTURAL SUMMARY: Total recording time was 405 minutes. Sleep efficiency 54 %. Sleep latency 25 minutes. REM latency 263 minutes. Stage NI 16 %, N2 69 %  and N3 0 % and REM sleep 14 %.    RESPIRATORY DATA:  Baseline oxygen saturation is 96 %. The lowest saturation is 75 %. The diagnostic AHI is 27. The RDI is 28. The REM AHI is 78.  LIMB MOVEMENT SUMMARY: PLM index 0.   ELECTROCARDIOGRAM SUMMARY: Average heart rate is 71 with no significant dysrhythmias observed.   IMPRESSION:  1. Moderate obstructive sleep apnea syndrome worse during REM sleep. A formal sleep titration CPAP recording is suggested. 2. Abnormal sleep architecture with poor sleep efficiency, fragmented sleep and absence of slow wave sleep.  Thanks for this referral.  Raynell Upton A. Merlene Laughter, M.D. Diplomat, Tax adviser of Sleep Medicine.

## 2014-02-18 ENCOUNTER — Ambulatory Visit (INDEPENDENT_AMBULATORY_CARE_PROVIDER_SITE_OTHER): Payer: BC Managed Care – PPO | Admitting: Cardiology

## 2014-02-18 VITALS — BP 104/70 | HR 73 | Ht 64.0 in | Wt 294.0 lb

## 2014-02-18 DIAGNOSIS — I428 Other cardiomyopathies: Secondary | ICD-10-CM

## 2014-02-18 DIAGNOSIS — I10 Essential (primary) hypertension: Secondary | ICD-10-CM

## 2014-02-18 NOTE — Progress Notes (Signed)
Clinical Summary Ms. Czarnecki is a 53 y.o.female seen today for follow up of the following medical problems.   1. NICM  - new diagnosis made during admission 10/2013  - 10/2013 echo LVEF 78-24%, grade I diastolic dysfunction  - cath 10/2013 with patent coronaries,  - denies any SOB or DOE. No orthopnea, no PND, no LE edema  - limiting Na, avoiding NSAIDs.  - weights herself every morning, stable in the low 290s  - last visit increased coreg to 25mg  bid. Her 6pm dose makes her fatigued and dizzy, she has cut it in half and takes 12.5 at 5pm and 12.5 at 6pm.  - she reports pcp decreased lasix after change in Cr, we do not have these labs  2. HTN  - checks bp twice a day, typically around 100s/60s, though often some lower pressures at night.   3. OSA?  - previous snoring, + apneic episodes. No daytime somnolence.  - sleep study showed moderate OSA, she is awaiting CPAP testing.   Past Medical History  Diagnosis Date  . Essential hypertension, benign   . Hypercholesteremia   . Chronic back pain   . Neuromuscular disorder     Numbness in right hand and left foot     Allergies  Allergen Reactions  . Bee Venom Anaphylaxis and Hives     Current Outpatient Prescriptions  Medication Sig Dispense Refill  . aspirin 81 MG tablet Take 81 mg by mouth daily.      Marland Kitchen atorvastatin (LIPITOR) 20 MG tablet Take 20 mg by mouth daily at 6 PM.       . busPIRone (BUSPAR) 15 MG tablet Take 1 tablet by mouth 2 (two) times daily.      . Calcium Carb-Cholecalciferol (CALCIUM + D3) 600-200 MG-UNIT TABS Take 1 tablet by mouth 2 (two) times daily.      . carvedilol (COREG) 25 MG tablet Take 1 tablet (25 mg total) by mouth 2 (two) times daily.  180 tablet  3  . EPINEPHrine (EPI-PEN) 0.3 mg/0.3 mL SOAJ injection Inject 0.3 mg into the muscle once.      . furosemide (LASIX) 40 MG tablet Take 40 mg by mouth daily.      Marland Kitchen lisinopril (PRINIVIL,ZESTRIL) 40 MG tablet Take 1 tablet by mouth daily.      .  metFORMIN (GLUCOPHAGE) 1000 MG tablet Take 1 tablet by mouth 2 (two) times daily.      Marland Kitchen OVER THE COUNTER MEDICATION Place 1 drop into both eyes daily as needed (dryness).      Marland Kitchen spironolactone (ALDACTONE) 25 MG tablet Take 0.5 tablets (12.5 mg total) by mouth 2 (two) times daily.  30 tablet  5   No current facility-administered medications for this visit.     Past Surgical History  Procedure Laterality Date  . Tubal ligation    . Colonoscopy  06/02/2012    Procedure: COLONOSCOPY;  Surgeon: Danie Binder, MD;  Location: [facility];  Service: Endoscopy;  Laterality: N/A;  8:30 AM  . Back surgery      Lumbar fusion     Allergies  Allergen Reactions  . Bee Venom Anaphylaxis and Hives      Family History  Problem Relation Age of Onset  . Colon cancer Neg Hx   . Prostate cancer Father      Social History Ms. Toro reports that she has never smoked. She does not have any smokeless tobacco history on file. Ms. Kiper reports  that she drinks alcohol.   Review of Systems CONSTITUTIONAL: No weight loss, fever, chills, weakness or fatigue.  HEENT: Eyes: No visual loss, blurred vision, double vision or yellow sclerae.No hearing loss, sneezing, congestion, runny nose or sore throat.  SKIN: No rash or itching.  CARDIOVASCULAR: per HPI RESPIRATORY: No shortness of breath, cough or sputum.  GASTROINTESTINAL: No anorexia, nausea, vomiting or diarrhea. No abdominal pain or blood.  GENITOURINARY: No burning on urination, no polyuria NEUROLOGICAL: No headache, dizziness, syncope, paralysis, ataxia, numbness or tingling in the extremities. No change in bowel or bladder control.  MUSCULOSKELETAL: No muscle, back pain, joint pain or stiffness.  LYMPHATICS: No enlarged nodes. No history of splenectomy.  PSYCHIATRIC: No history of depression or anxiety.  ENDOCRINOLOGIC: No reports of sweating, cold or heat intolerance. No polyuria or polydipsia.  Marland Kitchen   Physical Examination Filed  Vitals:   02/18/14 1558  BP: 104/70  Pulse: 73   Filed Weights   02/18/14 1558  Weight: 294 lb (133.358 kg)    Gen: resting comfortably, no acute distress HEENT: no scleral icterus, pupils equal round and reactive, no palptable cervical adenopathy,  CV: RRR, no m/r/g, no JVD, no carotid bruits Resp: Clear to auscultation bilaterally GI: abdomen is soft, non-tender, non-distended, normal bowel sounds, no hepatosplenomegaly MSK: extremities are warm, no edema.  Skin: warm, no rash Neuro:  no focal deficits Psych: appropriate affect   Diagnostic Studies 10/2013 Echo  Study Conclusions  - Left ventricle: The cavity size was normal. Wall thickness was increased in a pattern of mild LVH. Systolic function was severely reduced. The estimated ejection fraction was in the range of 25% to 30%. Possible severe hypokinesis of the anteroseptal myocardium. Doppler parameters are consistent with abnormal left ventricular relaxation (grade 1 diastolic dysfunction). - Aortic valve: Poorly visualized. Mildly calcified annulus. Probably trileaflet. No significant regurgitation. - Right ventricle: Systolic function was reduced. - Right atrium: Central venous pressure: 53mm Hg (est). - Tricuspid valve: Physiologic regurgitation. - Pulmonary arteries: Systolic pressure could not be accurately estimated. - Pericardium, extracardiac: A prominent pericardial fat pad was present. Impressions:  - Extremely limited study. There is mild LVH, overall normal LV chamber size, LVEF roughly estimated at 25-30% based on available images. There appears to be significant anteroseptal hypokinesis based on some views. Optison contrast may provide better images. There is grade 1 diastolic dysfunction. RV contraction is abnormal as well. No significant degree of mitral or tricuspid regurgitation noted, cannotassess PASP. CVP appears normal. Epicardial fat pad noted.  10/2013 Cath  Procedural Findings:    Hemodynamics:  AO: 172/87 mmHg  LV: 165/20 mmHg  LVEDP: 25 mmHg  Coronary angiography:  Coronary dominance: right  Left Main: normal  Left Anterior Descending (LAD): Normal in size with no significant disease.  1st diagonal (D1): Normal in size with no significant disease.  2nd diagonal (D2): Large in size with no significant disease.  3rd diagonal (D3): Normal in size with no significant disease.  Circumflex (LCx): Normal in size and nondominant the vessel is free of any significant disease.  1st obtuse marginal: Small in size with minor irregularities.  2nd obtuse marginal: Normal in size with no significant disease.  3rd obtuse marginal: Normal in size with no significant disease  Right Coronary Artery: normal in size and dominant the vessel is free of any significant disease.  Posterior descending artery: large in size with no significant disease  Posterior AV segment: normal in size with no significant disease.  Posterolateral branchs: 3 normal  size branchs which are free of significant disease. Left ventriculography: Left ventricular systolic function is severely reduced , LVEF is estimated at 25-30 %, there is no significant mitral regurgitation  Final Conclusions:  1. Normal coronary arteries.  2. Severely reduced LV systolic function with an ejection fraction of 25-30% due to nonischemic cardiomyopathy.  3. Moderately elevated left ventricular end-diastolic pressure  Recommendations:  Continue diuresis. Treat medically for nonischemic cardiomyopathy. Blood pressure control is recommended. The patient should be screened for sleep apnea and treated as indicated.        Assessment and Plan  1. NICM  - LVEF 25-30% by echo 10/2013, NYHA II, she does not have an ICD at this time as she has just recently been diagnosed and is undergoing a trial of medical therapy  - she has reached her goal dosing of coreg, lisinopril, and aldactone. Had some initial side effects to last coreg  increase but is now tolerating  - repeat echo, pending results she may need consideration for ICD. We will ask for contrast due to body habitus and the focus on evaluating her LVEF.   2. HTN  - at goal, continue current meds   3. Possible OSA?  - awaiting second part of sleep study, we will call to find out about the scheduling    F/u 4 months   Arnoldo Lenis, M.D., F.A.C.C.

## 2014-02-18 NOTE — Patient Instructions (Signed)
Your physician recommends that you schedule a follow-up appointment in: 4 months  Your physician has requested that you have an echocardiogram. Echocardiography is a painless test that uses sound waves to create images of your heart. It provides your doctor with information about the size and shape of your heart and how well your heart's chambers and valves are working. This procedure takes approximately one hour. There are no restrictions for this procedure.

## 2014-02-21 ENCOUNTER — Ambulatory Visit (HOSPITAL_COMMUNITY)
Admission: RE | Admit: 2014-02-21 | Discharge: 2014-02-21 | Disposition: A | Discharge status: Home / Self Care | Payer: BC Managed Care – PPO | Source: Ambulatory Visit | Attending: Cardiology | Admitting: Cardiology

## 2014-02-21 DIAGNOSIS — E785 Hyperlipidemia, unspecified: Secondary | ICD-10-CM | POA: Insufficient documentation

## 2014-02-21 DIAGNOSIS — I428 Other cardiomyopathies: Secondary | ICD-10-CM | POA: Insufficient documentation

## 2014-02-21 DIAGNOSIS — G4733 Obstructive sleep apnea (adult) (pediatric): Secondary | ICD-10-CM | POA: Insufficient documentation

## 2014-02-21 DIAGNOSIS — E119 Type 2 diabetes mellitus without complications: Secondary | ICD-10-CM | POA: Insufficient documentation

## 2014-02-21 DIAGNOSIS — I10 Essential (primary) hypertension: Secondary | ICD-10-CM | POA: Insufficient documentation

## 2014-02-21 DIAGNOSIS — Z6841 Body Mass Index (BMI) 40.0 and over, adult: Secondary | ICD-10-CM | POA: Insufficient documentation

## 2014-02-21 MED ORDER — PERFLUTREN PROTEIN A MICROSPH IV SUSP
0.5000 mL | Freq: Once | INTRAVENOUS | Status: AC
  Administered 2014-02-21 (×2): 1 mL via INTRAVENOUS
  Filled 2014-02-21: qty 3

## 2014-02-21 NOTE — Progress Notes (Signed)
  Echocardiogram 2D Echocardiogram with Optison has been performed.  [place], [place] 02/21/2014, 2:14 PM

## 2014-03-07 ENCOUNTER — Telehealth: Payer: Self-pay | Admitting: *Deleted

## 2014-03-07 NOTE — Telephone Encounter (Signed)
Received fax from [facility] from [facility] stating that pt does not want test right now.

## 2014-03-11 ENCOUNTER — Other Ambulatory Visit: Payer: Self-pay

## 2014-03-11 MED ORDER — CARVEDILOL 25 MG PO TABS: 25.0000 mg | ORAL_TABLET | Freq: Two times a day (BID) | ORAL | Status: DC

## 2014-03-11 NOTE — Progress Notes (Signed)
[facility] for coreg 25 mg bid as requested

## 2014-06-06 ENCOUNTER — Encounter (HOSPITAL_COMMUNITY): Payer: Self-pay | Admitting: Psychiatry

## 2014-06-06 ENCOUNTER — Ambulatory Visit (INDEPENDENT_AMBULATORY_CARE_PROVIDER_SITE_OTHER): Payer: BC Managed Care – PPO | Admitting: Psychiatry

## 2014-06-06 DIAGNOSIS — F331 Major depressive disorder, recurrent, moderate: Secondary | ICD-10-CM
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
## 2014-06-06 NOTE — Patient Instructions (Signed)
Discussed orally 

## 2014-06-17 ENCOUNTER — Encounter: Payer: Self-pay | Admitting: Cardiology

## 2014-07-01 ENCOUNTER — Ambulatory Visit (INDEPENDENT_AMBULATORY_CARE_PROVIDER_SITE_OTHER): Payer: BC Managed Care – PPO | Admitting: Cardiology

## 2014-07-01 VITALS — BP 112/66 | HR 78 | Ht 64.0 in | Wt 296.0 lb

## 2014-07-01 DIAGNOSIS — I5022 Chronic systolic (congestive) heart failure: Secondary | ICD-10-CM

## 2014-07-01 DIAGNOSIS — I429 Cardiomyopathy, unspecified: Secondary | ICD-10-CM

## 2014-07-01 DIAGNOSIS — I10 Essential (primary) hypertension: Secondary | ICD-10-CM

## 2014-07-01 DIAGNOSIS — G4733 Obstructive sleep apnea (adult) (pediatric): Secondary | ICD-10-CM

## 2014-07-01 DIAGNOSIS — I428 Other cardiomyopathies: Secondary | ICD-10-CM

## 2014-07-01 NOTE — Patient Instructions (Signed)
Your physician wants you to follow-up in: 1 year You will receive a reminder letter in the mail two months in advance. If you don't receive a letter, please call our office to schedule the follow-up appointment.    Your physician recommends that you continue on your current medications as directed. Please refer to the Current Medication list given to you today.     Thank you for choosing [facility] !  

## 2014-07-01 NOTE — Progress Notes (Signed)
Clinical Summary Ms. Plazola is a 53 y.o.female seen today for follow up of the following medical problems.   1. NICM  - 10/2013 echo LVEF 64-40%, grade I diastolic dysfunction  - cath 10/2013 with patent coronaries - repeat echo 02/2014 after course of medical therapy showed normalization of LVEF at 60-65%. - denies any SOB or DOE. No orthopnea, no PND, no LE edema  - limiting Na, avoiding NSAIDs.  - weights herself every morning, stable in the low 290s    2. HTN  - checks bp twice a week, typically around 110s/60s, - compliant with bp meds  3. OSA - sleep study showed moderate OSA. She has elected not to undergo CPAP testing, she wants to try to lose weight first and see how her OSA responds    Past Medical History  Diagnosis Date  . Essential hypertension, benign   . Hypercholesteremia   . Chronic back pain   . Neuromuscular disorder     Numbness in right hand and left foot  . Diabetes mellitus, type II      Allergies  Allergen Reactions  . Bee Venom Anaphylaxis and Hives     Current Outpatient Prescriptions  Medication Sig Dispense Refill  . aspirin 81 MG tablet Take 81 mg by mouth daily.    Marland Kitchen atorvastatin (LIPITOR) 20 MG tablet Take 20 mg by mouth daily at 6 PM.     . busPIRone (BUSPAR) 15 MG tablet Take 1 tablet by mouth 2 (two) times daily.    . Calcium Carb-Cholecalciferol (CALCIUM + D3) 600-200 MG-UNIT TABS Take 1 tablet by mouth 2 (two) times daily.    . carvedilol (COREG) 25 MG tablet Take 1 tablet (25 mg total) by mouth 2 (two) times daily. 180 tablet 3  . EPINEPHrine (EPI-PEN) 0.3 mg/0.3 mL SOAJ injection Inject 0.3 mg into the muscle once.    . furosemide (LASIX) 40 MG tablet Take 40 mg by mouth daily.    Marland Kitchen lisinopril (PRINIVIL,ZESTRIL) 40 MG tablet Take 1 tablet by mouth daily.    . metFORMIN (GLUCOPHAGE) 1000 MG tablet Take 1 tablet by mouth 2 (two) times daily.    Marland Kitchen OVER THE COUNTER MEDICATION Place 1 drop into both eyes daily as needed  (dryness).    Marland Kitchen spironolactone (ALDACTONE) 25 MG tablet Take 0.5 tablets (12.5 mg total) by mouth 2 (two) times daily. 30 tablet 5   No current facility-administered medications for this visit.     Past Surgical History  Procedure Laterality Date  . Tubal ligation    . Colonoscopy  06/02/2012    Procedure: COLONOSCOPY;  Surgeon: Danie Binder, MD;  Location: [facility];  Service: Endoscopy;  Laterality: N/A;  8:30 AM  . Back surgery      Lumbar fusion     Allergies  Allergen Reactions  . Bee Venom Anaphylaxis and Hives      Family History  Problem Relation Age of Onset  . Colon cancer Neg Hx   . Prostate cancer Father   . Depression Mother   . Schizophrenia Sister   . Depression Sister      Social History Ms. Morlock reports that she has never smoked. She has never used smokeless tobacco. Ms. Eastwood reports that she drinks alcohol.   Review of Systems CONSTITUTIONAL: No weight loss, fever, chills, weakness or fatigue.  HEENT: Eyes: No visual loss, blurred vision, double vision or yellow sclerae.No hearing loss, sneezing, congestion, runny nose or sore throat.  SKIN: No rash or itching.  CARDIOVASCULAR: per HPI RESPIRATORY: No shortness of breath, cough or sputum.  GASTROINTESTINAL: No anorexia, nausea, vomiting or diarrhea. No abdominal pain or blood.  GENITOURINARY: No burning on urination, no polyuria NEUROLOGICAL: No headache, dizziness, syncope, paralysis, ataxia, numbness or tingling in the extremities. No change in bowel or bladder control.  MUSCULOSKELETAL: No muscle, back pain, joint pain or stiffness.  LYMPHATICS: No enlarged nodes. No history of splenectomy.  PSYCHIATRIC: No history of depression or anxiety.  ENDOCRINOLOGIC: No reports of sweating, cold or heat intolerance. No polyuria or polydipsia.  Marland Kitchen   Physical Examination There were no vitals filed for this visit. There were no vitals filed for this visit.  Gen: resting comfortably, no acute  distress HEENT: no scleral icterus, pupils equal round and reactive, no palptable cervical adenopathy,  CV Resp: Clear to auscultation bilaterally GI: abdomen is soft, non-tender, non-distended, normal bowel sounds, no hepatosplenomegaly MSK: extremities are warm, no edema.  Skin: warm, no rash Neuro:  no focal deficits Psych: appropriate affect   Diagnostic Studies 10/2013 Echo  Study Conclusions  - Left ventricle: The cavity size was normal. Wall thickness was increased in a pattern of mild LVH. Systolic function was severely reduced. The estimated ejection fraction was in the range of 25% to 30%. Possible severe hypokinesis of the anteroseptal myocardium. Doppler parameters are consistent with abnormal left ventricular relaxation (grade 1 diastolic dysfunction). - Aortic valve: Poorly visualized. Mildly calcified annulus. Probably trileaflet. No significant regurgitation. - Right ventricle: Systolic function was reduced. - Right atrium: Central venous pressure: 83mm Hg (est). - Tricuspid valve: Physiologic regurgitation. - Pulmonary arteries: Systolic pressure could not be accurately estimated. - Pericardium, extracardiac: A prominent pericardial fat pad was present. Impressions:  - Extremely limited study. There is mild LVH, overall normal LV chamber size, LVEF roughly estimated at 25-30% based on available images. There appears to be significant anteroseptal hypokinesis based on some views. Optison contrast may provide better images. There is grade 1 diastolic dysfunction. RV contraction is abnormal as well. No significant degree of mitral or tricuspid regurgitation noted, cannotassess PASP. CVP appears normal. Epicardial fat pad noted.  10/2013 Cath  Procedural Findings:  Hemodynamics:  AO: 172/87 mmHg  LV: 165/20 mmHg  LVEDP: 25 mmHg  Coronary angiography:  Coronary dominance: right   Left Main: normal   Left Anterior Descending (LAD): Normal in size  with no significant disease.   1st diagonal (D1): Normal in size with no significant disease.   2nd diagonal (D2): Large in size with no significant disease.   3rd diagonal (D3): Normal in size with no significant disease.   Circumflex (LCx): Normal in size and nondominant the vessel is free of any significant disease.   1st obtuse marginal: Small in size with minor irregularities.   2nd obtuse marginal: Normal in size with no significant disease.   3rd obtuse marginal: Normal in size with no significant disease   Right Coronary Artery: normal in size and dominant the vessel is free of any significant disease.   Posterior descending artery: large in size with no significant disease   Posterior AV segment: normal in size with no significant disease.   Posterolateral branchs: 3 normal size branchs which are free of significant disease. Left ventriculography: Left ventricular systolic function is severely reduced , LVEF is estimated at 25-30 %, there is no significant mitral regurgitation  Final Conclusions:  1. Normal coronary arteries.  2. Severely reduced LV systolic function with an ejection fraction  of 25-30% due to nonischemic cardiomyopathy.  3. Moderately elevated left ventricular end-diastolic pressure  Recommendations:  Continue diuresis. Treat medically for nonischemic cardiomyopathy. Blood pressure control is recommended. The patient should be screened for sleep apnea and treated as indicated.    02/2014 Echo Study Conclusions  - Procedure narrative: Transthoracic echocardiography. Image quality was adequate. The study was technically difficult, as a result of poor sound wave transmission and body habitus. Intravenous contrast (Optison) was administered. - Left ventricle: The cavity size was normal. Wall thickness was normal. Systolic function was normal. The estimated ejection fraction was in the range of 60% to 65%. Wall motion was  normal; there were no regional wall motion abnormalities. Left ventricular diastolic function parameters were normal. - Right ventricle: Not well visualized. Grossly appears normal in size and function. RV TAPSE is 2.3 cm.   Assessment and Plan  1. NICM  - initially LVEF 25-30% by echo 10/2013, with medical therapy normalization of LVEF to 60-65%. Euvolemic today, denies any significant symptoms.  - continue current meds . 2. HTN  - at goal, continue current meds   3. OSA - she has elected to defer on CPAP testing, wishes to lose weight first and see how her OSA responds. Continue to follow   F/u 1 year   Arnoldo Lenis, M.D.

## 2014-07-02 ENCOUNTER — Ambulatory Visit: Payer: Self-pay | Admitting: Physician Assistant

## 2014-07-02 ENCOUNTER — Encounter: Payer: Self-pay | Admitting: Cardiology

## 2014-07-12 ENCOUNTER — Ambulatory Visit (INDEPENDENT_AMBULATORY_CARE_PROVIDER_SITE_OTHER): Payer: BC Managed Care – PPO | Admitting: Psychiatry

## 2014-07-12 DIAGNOSIS — F331 Major depressive disorder, recurrent, moderate: Secondary | ICD-10-CM

## 2014-07-12 NOTE — Patient Instructions (Signed)
Discussed orally 
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
## 2014-07-26 ENCOUNTER — Ambulatory Visit (INDEPENDENT_AMBULATORY_CARE_PROVIDER_SITE_OTHER): Payer: BC Managed Care – PPO | Admitting: Psychiatry

## 2014-07-26 DIAGNOSIS — F331 Major depressive disorder, recurrent, moderate: Secondary | ICD-10-CM

## 2014-07-26 NOTE — Patient Instructions (Signed)
Discussed orally 
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
## 2014-08-01 ENCOUNTER — Encounter (HOSPITAL_COMMUNITY): Payer: Self-pay | Admitting: Interventional Cardiology

## 2014-08-09 ENCOUNTER — Ambulatory Visit (INDEPENDENT_AMBULATORY_CARE_PROVIDER_SITE_OTHER): Payer: BC Managed Care – PPO | Admitting: Psychiatry

## 2014-08-09 DIAGNOSIS — F331 Major depressive disorder, recurrent, moderate: Secondary | ICD-10-CM
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
## 2014-08-09 NOTE — Patient Instructions (Signed)
Discussed orally 

## 2014-08-29 ENCOUNTER — Ambulatory Visit (INDEPENDENT_AMBULATORY_CARE_PROVIDER_SITE_OTHER): Payer: BLUE CROSS/BLUE SHIELD | Admitting: Psychiatry

## 2014-08-29 DIAGNOSIS — F331 Major depressive disorder, recurrent, moderate: Secondary | ICD-10-CM
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
## 2014-08-29 NOTE — Patient Instructions (Signed)
Discussed orally 

## 2014-09-13 ENCOUNTER — Ambulatory Visit (HOSPITAL_COMMUNITY): Payer: Self-pay | Admitting: Psychiatry

## 2014-09-27 ENCOUNTER — Ambulatory Visit (INDEPENDENT_AMBULATORY_CARE_PROVIDER_SITE_OTHER): Payer: BLUE CROSS/BLUE SHIELD | Admitting: Psychiatry

## 2014-09-27 DIAGNOSIS — F331 Major depressive disorder, recurrent, moderate: Secondary | ICD-10-CM

## 2014-09-27 NOTE — Patient Instructions (Signed)
Discussed orally 
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
## 2014-10-11 ENCOUNTER — Ambulatory Visit (INDEPENDENT_AMBULATORY_CARE_PROVIDER_SITE_OTHER): Payer: BLUE CROSS/BLUE SHIELD | Admitting: Psychiatry

## 2014-10-11 DIAGNOSIS — F331 Major depressive disorder, recurrent, moderate: Secondary | ICD-10-CM | POA: Diagnosis not present
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
## 2014-10-11 NOTE — Patient Instructions (Signed)
Discussed orally 

## 2014-10-24 ENCOUNTER — Ambulatory Visit (INDEPENDENT_AMBULATORY_CARE_PROVIDER_SITE_OTHER): Payer: BLUE CROSS/BLUE SHIELD | Admitting: Psychiatry

## 2014-10-24 DIAGNOSIS — F331 Major depressive disorder, recurrent, moderate: Secondary | ICD-10-CM
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
## 2014-10-24 NOTE — Patient Instructions (Signed)
Discussed orally 

## 2014-11-07 ENCOUNTER — Ambulatory Visit (INDEPENDENT_AMBULATORY_CARE_PROVIDER_SITE_OTHER): Payer: BLUE CROSS/BLUE SHIELD | Admitting: Psychiatry

## 2014-11-07 DIAGNOSIS — F331 Major depressive disorder, recurrent, moderate: Secondary | ICD-10-CM
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
## 2014-11-07 NOTE — Patient Instructions (Signed)
Discussed orally 

## 2014-11-29 ENCOUNTER — Ambulatory Visit (HOSPITAL_COMMUNITY): Payer: Self-pay | Admitting: Psychiatry

## 2014-12-12 ENCOUNTER — Ambulatory Visit (INDEPENDENT_AMBULATORY_CARE_PROVIDER_SITE_OTHER): Payer: BLUE CROSS/BLUE SHIELD | Admitting: Psychiatry

## 2014-12-12 DIAGNOSIS — F331 Major depressive disorder, recurrent, moderate: Secondary | ICD-10-CM

## 2014-12-12 NOTE — Patient Instructions (Signed)
Discussed orally 
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
## 2015-01-03 ENCOUNTER — Ambulatory Visit (HOSPITAL_COMMUNITY): Payer: Self-pay | Admitting: Psychiatry

## 2015-01-16 ENCOUNTER — Ambulatory Visit (INDEPENDENT_AMBULATORY_CARE_PROVIDER_SITE_OTHER): Payer: BLUE CROSS/BLUE SHIELD | Admitting: Psychiatry

## 2015-01-16 DIAGNOSIS — F331 Major depressive disorder, recurrent, moderate: Secondary | ICD-10-CM
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
## 2015-01-16 NOTE — Patient Instructions (Signed)
Discussed orally 

## 2015-01-27 ENCOUNTER — Other Ambulatory Visit: Payer: Self-pay | Admitting: Physician Assistant

## 2015-01-27 ENCOUNTER — Other Ambulatory Visit: Payer: Self-pay

## 2015-01-27 MED ORDER — FUROSEMIDE 40 MG PO TABS: 40.0000 mg | ORAL_TABLET | Freq: Every day | ORAL | Status: DC

## 2015-02-14 ENCOUNTER — Ambulatory Visit (INDEPENDENT_AMBULATORY_CARE_PROVIDER_SITE_OTHER): Payer: BLUE CROSS/BLUE SHIELD | Admitting: Psychiatry

## 2015-02-14 DIAGNOSIS — F331 Major depressive disorder, recurrent, moderate: Secondary | ICD-10-CM

## 2015-02-14 NOTE — Patient Instructions (Signed)
Discussed orally 
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
## 2015-02-28 ENCOUNTER — Ambulatory Visit (HOSPITAL_COMMUNITY): Payer: Self-pay | Admitting: Psychiatry

## 2015-03-24 ENCOUNTER — Ambulatory Visit (INDEPENDENT_AMBULATORY_CARE_PROVIDER_SITE_OTHER): Payer: BLUE CROSS/BLUE SHIELD | Admitting: Psychiatry

## 2015-03-24 DIAGNOSIS — F331 Major depressive disorder, recurrent, moderate: Secondary | ICD-10-CM
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
## 2015-04-09 ENCOUNTER — Ambulatory Visit (INDEPENDENT_AMBULATORY_CARE_PROVIDER_SITE_OTHER): Payer: BLUE CROSS/BLUE SHIELD | Admitting: Psychiatry

## 2015-04-09 DIAGNOSIS — F331 Major depressive disorder, recurrent, moderate: Secondary | ICD-10-CM

## 2015-04-09 NOTE — Patient Instructions (Signed)
Discussed orally 
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
## 2015-04-23 ENCOUNTER — Encounter (HOSPITAL_COMMUNITY): Payer: Self-pay | Admitting: Psychiatry

## 2015-04-23 ENCOUNTER — Ambulatory Visit (INDEPENDENT_AMBULATORY_CARE_PROVIDER_SITE_OTHER): Payer: BLUE CROSS/BLUE SHIELD | Admitting: Psychiatry

## 2015-04-23 DIAGNOSIS — F331 Major depressive disorder, recurrent, moderate: Secondary | ICD-10-CM | POA: Diagnosis not present

## 2015-04-23 NOTE — Patient Instructions (Signed)
Discussed orally 
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
## 2015-04-25 ENCOUNTER — Other Ambulatory Visit: Payer: Self-pay | Admitting: Cardiology

## 2015-04-25 MED ORDER — FUROSEMIDE 40 MG PO TABS: ORAL_TABLET | ORAL | Status: DC

## 2015-04-25 NOTE — Telephone Encounter (Signed)
Patient is requesting refill on Lasix  -[facility].

## 2015-05-01 ENCOUNTER — Other Ambulatory Visit: Payer: Self-pay

## 2015-05-01 MED ORDER — FUROSEMIDE 40 MG PO TABS: ORAL_TABLET | ORAL | Status: DC

## 2015-05-06 NOTE — Telephone Encounter (Signed)
Refill sent on 05/01/15.

## 2015-05-26 ENCOUNTER — Encounter (HOSPITAL_COMMUNITY): Payer: Self-pay | Admitting: Psychiatry

## 2015-05-26 ENCOUNTER — Ambulatory Visit (INDEPENDENT_AMBULATORY_CARE_PROVIDER_SITE_OTHER): Payer: BLUE CROSS/BLUE SHIELD | Admitting: Psychiatry

## 2015-05-26 DIAGNOSIS — F331 Major depressive disorder, recurrent, moderate: Secondary | ICD-10-CM

## 2015-05-26 NOTE — Patient Instructions (Signed)
Discussed orally 
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
## 2015-06-09 ENCOUNTER — Ambulatory Visit (HOSPITAL_COMMUNITY): Payer: Self-pay | Admitting: Psychiatry

## 2015-06-23 ENCOUNTER — Encounter (HOSPITAL_COMMUNITY): Payer: Self-pay | Admitting: Psychiatry

## 2015-06-23 ENCOUNTER — Ambulatory Visit (INDEPENDENT_AMBULATORY_CARE_PROVIDER_SITE_OTHER): Payer: BLUE CROSS/BLUE SHIELD | Admitting: Psychiatry

## 2015-06-23 DIAGNOSIS — F331 Major depressive disorder, recurrent, moderate: Secondary | ICD-10-CM | POA: Diagnosis not present

## 2015-06-23 NOTE — Addendum Note (Signed)
Addended by: Maurice Small E on: 06/23/2015 12:24 PM   Modules accepted: Level of Service
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
## 2015-06-23 NOTE — Patient Instructions (Signed)
Discussed orally 

## 2015-07-03 ENCOUNTER — Encounter: Payer: Self-pay | Admitting: Cardiology

## 2015-07-03 ENCOUNTER — Ambulatory Visit (INDEPENDENT_AMBULATORY_CARE_PROVIDER_SITE_OTHER): Payer: Self-pay | Admitting: Cardiology

## 2015-07-03 VITALS — BP 150/78 | HR 86 | Ht 64.0 in | Wt 304.0 lb

## 2015-07-03 DIAGNOSIS — I5022 Chronic systolic (congestive) heart failure: Secondary | ICD-10-CM

## 2015-07-03 DIAGNOSIS — I10 Essential (primary) hypertension: Secondary | ICD-10-CM | POA: Diagnosis not present

## 2015-07-03 MED ORDER — LISINOPRIL 20 MG PO TABS: 20.0000 mg | ORAL_TABLET | Freq: Every day | ORAL | Status: DC

## 2015-07-03 NOTE — Patient Instructions (Signed)
Medication Instructions:  DECREASE LISINOPRIL TO 20 MG DAILY  Labwork: I WILL REQUEST YOUR LAB WORK FROM YOUR PCP  Testing/Procedures: NONE  Follow-Up: Your physician wants you to follow-up in: [place] DR. BRANCH. You will receive a reminder letter in the mail two months in advance. If you don't receive a letter, please call our office to schedule the follow-up appointment.    Any Other Special Instructions Will Be Listed Below (If Applicable).  Your physician has requested that you regularly monitor and record your blood pressure readings at home. Please use the same machine at the same time of day to check your readings and record them to bring to your follow-up visit.  START 2 WEEKS BEFORE YOUR NEXT VISIT WITH YOUR PCP      If you need a refill on your cardiac medications before your next appointment, please call your pharmacy.  Thanks for choosing [facility]!!!

## 2015-07-03 NOTE — Progress Notes (Signed)
Patient ID: Elodia Florence, female   DOB: [birth year], 54 y.o.   MRN: [id number]     Clinical Summary Ms. Gaston is a 54 y.o.female seen today for follow up of the following medical problems.   1. NICM  - 10/2013 echo LVEF [id number], grade I diastolic dysfunction  - cath 10/2013 with patent coronaries - repeat echo 02/2014 after course of medical therapy showed normalization of LVEF at 60-65%.    - denies any SOB or DOE. Started going to [employer] working out in the pool. No LE edema, no orthopnea, no pnd - compliant with meds   2. HTN  - checks at home occasionally. Can have symptmatic low bp's at times, can be as low as 90s/60s.  - compliant with bp meds  3. OSA - sleep study showed moderate OSA. She has elected not to undergo CPAP testing, she wants to try to lose weight first and see how her OSA responds  4. Hyperlipidemia - no recent panel in our system Past Medical History  Diagnosis Date  . Essential hypertension, benign   . Hypercholesteremia   . Chronic back pain   . Neuromuscular disorder ([place])     Numbness in right hand and left foot  . Diabetes mellitus, type II ([place])      Allergies  Allergen Reactions  . Bee Venom Anaphylaxis and Hives     Current Outpatient Prescriptions  Medication Sig Dispense Refill  . aspirin 81 MG tablet Take 81 mg by mouth daily.    Marland Kitchen atorvastatin (LIPITOR) 20 MG tablet Take 20 mg by mouth daily at 6 PM.     . busPIRone (BUSPAR) 15 MG tablet Take 1 tablet by mouth 2 (two) times daily.    . Calcium Carb-Cholecalciferol (CALCIUM + D3) 600-200 MG-UNIT TABS Take 1 tablet by mouth 2 (two) times daily.    . carvedilol (COREG) 25 MG tablet Take 1 tablet (25 mg total) by mouth 2 (two) times daily. 180 tablet 3  . EPINEPHrine (EPI-PEN) 0.3 mg/0.3 mL SOAJ injection Inject 0.3 mg into the muscle once.    . furosemide (LASIX) 40 MG tablet TAKE 1 & 1/2 TABLETS BY MOUTH TWICE DAILY 270 tablet 3  . lisinopril (PRINIVIL,ZESTRIL) 40 MG tablet  Take 1 tablet by mouth daily.    . metFORMIN (GLUCOPHAGE) 1000 MG tablet Take 1 tablet by mouth 2 (two) times daily.    Marland Kitchen OVER THE COUNTER MEDICATION Place 1 drop into both eyes daily as needed (dryness).    Marland Kitchen spironolactone (ALDACTONE) 25 MG tablet Take 0.5 tablets (12.5 mg total) by mouth 2 (two) times daily. 30 tablet 5   No current facility-administered medications for this visit.     Past Surgical History  Procedure Laterality Date  . Tubal ligation    . Colonoscopy  06/02/2012    Procedure: COLONOSCOPY;  Surgeon: Danie Binder, MD;  Location: [facility];  Service: Endoscopy;  Laterality: N/A;  8:30 AM  . Back surgery      Lumbar fusion  . Left and right heart catheterization with coronary angiogram N/A 10/24/2013    Procedure: LEFT AND RIGHT HEART CATHETERIZATION WITH CORONARY ANGIOGRAM;  Surgeon: Jettie Booze, MD;  Location: [facility] CATH LAB;  Service: Cardiovascular;  Laterality: N/A;     Allergies  Allergen Reactions  . Bee Venom Anaphylaxis and Hives      Family History  Problem Relation Age of Onset  . Colon cancer Neg Hx   . Prostate cancer Father   .  Depression Mother   . Schizophrenia Sister   . Depression Sister      Social History Ms. Taliercio reports that she has never smoked. She has never used smokeless tobacco. Ms. Phifer reports that she drinks alcohol.   Review of Systems CONSTITUTIONAL: No weight loss, fever, chills, weakness or fatigue.  HEENT: Eyes: No visual loss, blurred vision, double vision or yellow sclerae.No hearing loss, sneezing, congestion, runny nose or sore throat.  SKIN: No rash or itching.  CARDIOVASCULAR: per hpi RESPIRATORY: No shortness of breath, cough or sputum.  GASTROINTESTINAL: No anorexia, nausea, vomiting or diarrhea. No abdominal pain or blood.  GENITOURINARY: No burning on urination, no polyuria NEUROLOGICAL: No headache, dizziness, syncope, paralysis, ataxia, numbness or tingling in the extremities. No change in  bowel or bladder control.  MUSCULOSKELETAL: No muscle, back pain, joint pain or stiffness.  LYMPHATICS: No enlarged nodes. No history of splenectomy.  PSYCHIATRIC: No history of depression or anxiety.  ENDOCRINOLOGIC: No reports of sweating, cold or heat intolerance. No polyuria or polydipsia.  Marland Kitchen   Physical Examination Filed Vitals:   07/03/15 1104  BP: 150/78  Pulse: 86   Filed Vitals:   07/03/15 1104  Height: 5\' 4"  (1.626 m)  Weight: 304 lb (137.893 kg)    Gen: resting comfortably, no acute distress HEENT: no scleral icterus, pupils equal round and reactive, no palptable cervical adenopathy,  CV: RRR, no m/r/g, no jvd Resp: Clear to auscultation bilaterally GI: abdomen is soft, non-tender, non-distended, normal bowel sounds, no hepatosplenomegaly MSK: extremities are warm, no edema.  Skin: warm, no rash Neuro:  no focal deficits Psych: appropriate affect   Diagnostic Studies 10/2013 Echo  Study Conclusions  - Left ventricle: The cavity size was normal. Wall thickness was increased in a pattern of mild LVH. Systolic function was severely reduced. The estimated ejection fraction was in the range of 25% to 30%. Possible severe hypokinesis of the anteroseptal myocardium. Doppler parameters are consistent with abnormal left ventricular relaxation (grade 1 diastolic dysfunction). - Aortic valve: Poorly visualized. Mildly calcified annulus. Probably trileaflet. No significant regurgitation. - Right ventricle: Systolic function was reduced. - Right atrium: Central venous pressure: 64mm Hg (est). - Tricuspid valve: Physiologic regurgitation. - Pulmonary arteries: Systolic pressure could not be accurately estimated. - Pericardium, extracardiac: A prominent pericardial fat pad was present. Impressions:  - Extremely limited study. There is mild LVH, overall normal LV chamber size, LVEF roughly estimated at 25-30% based on available images. There appears to be  significant anteroseptal hypokinesis based on some views. Optison contrast may provide better images. There is grade 1 diastolic dysfunction. RV contraction is abnormal as well. No significant degree of mitral or tricuspid regurgitation noted, cannotassess PASP. CVP appears normal. Epicardial fat pad noted.  10/2013 Cath  Procedural Findings:  Hemodynamics:  AO: 172/87 mmHg  LV: 165/20 mmHg  LVEDP: 25 mmHg  Coronary angiography:  Coronary dominance: right   Left Main: normal   Left Anterior Descending (LAD): Normal in size with no significant disease.   1st diagonal (D1): Normal in size with no significant disease.   2nd diagonal (D2): Large in size with no significant disease.   3rd diagonal (D3): Normal in size with no significant disease.   Circumflex (LCx): Normal in size and nondominant the vessel is free of any significant disease.   1st obtuse marginal: Small in size with minor irregularities.   2nd obtuse marginal: Normal in size with no significant disease.   3rd obtuse marginal: Normal in size with  no significant disease   Right Coronary Artery: normal in size and dominant the vessel is free of any significant disease.   Posterior descending artery: large in size with no significant disease   Posterior AV segment: normal in size with no significant disease.   Posterolateral branchs: 3 normal size branchs which are free of significant disease. Left ventriculography: Left ventricular systolic function is severely reduced , LVEF is estimated at 25-30 %, there is no significant mitral regurgitation  Final Conclusions:  1. Normal coronary arteries.  2. Severely reduced LV systolic function with an ejection fraction of 25-30% due to nonischemic cardiomyopathy.  3. Moderately elevated left ventricular end-diastolic pressure  Recommendations:  Continue diuresis. Treat medically for nonischemic cardiomyopathy. Blood pressure control is  recommended. The patient should be screened for sleep apnea and treated as indicated.    02/2014 Echo Study Conclusions  - Procedure narrative: Transthoracic echocardiography. Image quality was adequate. The study was technically difficult, as a result of poor sound wave transmission and body habitus. Intravenous contrast (Optison) was administered. - Left ventricle: The cavity size was normal. Wall thickness was normal. Systolic function was normal. The estimated ejection fraction was in the range of 60% to 65%. Wall motion was normal; there were no regional wall motion abnormalities. Left ventricular diastolic function parameters were normal. - Right ventricle: Not well visualized. Grossly appears normal in size and function. RV TAPSE is 2.3 cm.    Assessment and Plan  1. NICM  - initially LVEF 25-30% by echo 10/2013, with medical therapy normalization of LVEF to 60-65%. Euvolemic today, denies any significant symptoms.  - continue current meds . 2. HTN  - some symptomatic low bp's at times, will decrease lisinopril to 20mg  daily  - keep bp log x 2 weeks and submit   3. OSA - she has elected to defer on CPAP testing, wishes to lose weight first and see how her OSA responds. Continue to follow   F/u 1 year   Arnoldo Lenis, M.D.

## 2015-07-07 ENCOUNTER — Ambulatory Visit (HOSPITAL_COMMUNITY): Payer: Self-pay | Admitting: Psychiatry

## 2015-07-09 ENCOUNTER — Ambulatory Visit (HOSPITAL_COMMUNITY): Payer: Self-pay | Admitting: Psychiatry

## 2015-08-07 ENCOUNTER — Ambulatory Visit (INDEPENDENT_AMBULATORY_CARE_PROVIDER_SITE_OTHER): Payer: BLUE CROSS/BLUE SHIELD | Admitting: Psychiatry

## 2015-08-07 ENCOUNTER — Encounter (HOSPITAL_COMMUNITY): Payer: Self-pay | Admitting: Psychiatry

## 2015-08-07 DIAGNOSIS — F331 Major depressive disorder, recurrent, moderate: Secondary | ICD-10-CM | POA: Diagnosis not present

## 2015-08-07 NOTE — Patient Instructions (Signed)
Discussed orally 
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
## 2015-09-01 ENCOUNTER — Ambulatory Visit (HOSPITAL_COMMUNITY): Payer: Self-pay | Admitting: Psychiatry

## 2015-09-15 ENCOUNTER — Ambulatory Visit (INDEPENDENT_AMBULATORY_CARE_PROVIDER_SITE_OTHER): Payer: BLUE CROSS/BLUE SHIELD | Admitting: Psychiatry

## 2015-09-15 DIAGNOSIS — F331 Major depressive disorder, recurrent, moderate: Secondary | ICD-10-CM | POA: Diagnosis not present

## 2015-09-15 NOTE — Patient Instructions (Signed)
Discussed orally 
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
## 2015-09-29 ENCOUNTER — Ambulatory Visit (INDEPENDENT_AMBULATORY_CARE_PROVIDER_SITE_OTHER): Payer: BLUE CROSS/BLUE SHIELD | Admitting: Psychiatry

## 2015-09-29 ENCOUNTER — Encounter (HOSPITAL_COMMUNITY): Payer: Self-pay | Admitting: Psychiatry

## 2015-09-29 DIAGNOSIS — F331 Major depressive disorder, recurrent, moderate: Secondary | ICD-10-CM | POA: Diagnosis not present
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
## 2015-09-29 NOTE — Patient Instructions (Signed)
Discussed orally 

## 2015-10-13 ENCOUNTER — Ambulatory Visit (HOSPITAL_COMMUNITY): Payer: Self-pay | Admitting: Psychiatry

## 2015-10-27 ENCOUNTER — Ambulatory Visit (INDEPENDENT_AMBULATORY_CARE_PROVIDER_SITE_OTHER): Payer: BLUE CROSS/BLUE SHIELD | Admitting: Psychiatry

## 2015-10-27 ENCOUNTER — Encounter (HOSPITAL_COMMUNITY): Payer: Self-pay | Admitting: Psychiatry

## 2015-10-27 DIAGNOSIS — F331 Major depressive disorder, recurrent, moderate: Secondary | ICD-10-CM
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
## 2015-10-27 NOTE — Patient Instructions (Signed)
Discussed orally 

## 2015-11-12 ENCOUNTER — Ambulatory Visit (INDEPENDENT_AMBULATORY_CARE_PROVIDER_SITE_OTHER): Payer: BLUE CROSS/BLUE SHIELD | Admitting: Psychiatry

## 2015-11-12 ENCOUNTER — Encounter (HOSPITAL_COMMUNITY): Payer: Self-pay | Admitting: Psychiatry

## 2015-11-12 DIAGNOSIS — F331 Major depressive disorder, recurrent, moderate: Secondary | ICD-10-CM | POA: Diagnosis not present
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
## 2015-11-12 NOTE — Patient Instructions (Signed)
Discussed orally 

## 2015-11-24 ENCOUNTER — Ambulatory Visit (INDEPENDENT_AMBULATORY_CARE_PROVIDER_SITE_OTHER): Payer: BLUE CROSS/BLUE SHIELD | Admitting: Psychiatry

## 2015-11-24 ENCOUNTER — Encounter (HOSPITAL_COMMUNITY): Payer: Self-pay | Admitting: Psychiatry

## 2015-11-24 DIAGNOSIS — F331 Major depressive disorder, recurrent, moderate: Secondary | ICD-10-CM

## 2015-11-24 NOTE — Patient Instructions (Signed)
Discussed orally 
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
## 2015-12-08 ENCOUNTER — Ambulatory Visit (INDEPENDENT_AMBULATORY_CARE_PROVIDER_SITE_OTHER): Payer: BLUE CROSS/BLUE SHIELD | Admitting: Psychiatry

## 2015-12-08 DIAGNOSIS — F331 Major depressive disorder, recurrent, moderate: Secondary | ICD-10-CM
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
## 2015-12-08 NOTE — Patient Instructions (Signed)
Discussed orally 

## 2015-12-22 ENCOUNTER — Ambulatory Visit (INDEPENDENT_AMBULATORY_CARE_PROVIDER_SITE_OTHER): Payer: BLUE CROSS/BLUE SHIELD | Admitting: Psychiatry

## 2015-12-22 DIAGNOSIS — F331 Major depressive disorder, recurrent, moderate: Secondary | ICD-10-CM

## 2015-12-22 NOTE — Patient Instructions (Signed)
Discussed orally 
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
## 2016-01-05 ENCOUNTER — Ambulatory Visit (HOSPITAL_COMMUNITY): Payer: Self-pay | Admitting: Psychiatry

## 2016-01-12 ENCOUNTER — Ambulatory Visit (HOSPITAL_COMMUNITY): Payer: Self-pay | Admitting: Psychiatry

## 2016-01-14 ENCOUNTER — Ambulatory Visit (INDEPENDENT_AMBULATORY_CARE_PROVIDER_SITE_OTHER): Payer: BLUE CROSS/BLUE SHIELD | Admitting: Psychiatry

## 2016-01-14 ENCOUNTER — Encounter (HOSPITAL_COMMUNITY): Payer: Self-pay | Admitting: Psychiatry

## 2016-01-14 DIAGNOSIS — F331 Major depressive disorder, recurrent, moderate: Secondary | ICD-10-CM | POA: Diagnosis not present

## 2016-01-14 NOTE — Patient Instructions (Signed)
Discussed orally 
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
## 2016-02-04 ENCOUNTER — Encounter (HOSPITAL_COMMUNITY): Payer: Self-pay | Admitting: Psychiatry

## 2016-02-04 ENCOUNTER — Ambulatory Visit (INDEPENDENT_AMBULATORY_CARE_PROVIDER_SITE_OTHER): Payer: BLUE CROSS/BLUE SHIELD | Admitting: Psychiatry

## 2016-02-04 DIAGNOSIS — F331 Major depressive disorder, recurrent, moderate: Secondary | ICD-10-CM
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
## 2016-02-04 NOTE — Patient Instructions (Signed)
Discussed orally 

## 2016-02-18 ENCOUNTER — Ambulatory Visit (HOSPITAL_COMMUNITY): Payer: Self-pay | Admitting: Psychiatry

## 2016-03-08 ENCOUNTER — Ambulatory Visit (INDEPENDENT_AMBULATORY_CARE_PROVIDER_SITE_OTHER): Payer: BLUE CROSS/BLUE SHIELD | Admitting: Psychiatry

## 2016-03-08 ENCOUNTER — Encounter (HOSPITAL_COMMUNITY): Payer: Self-pay | Admitting: Psychiatry

## 2016-03-08 DIAGNOSIS — F331 Major depressive disorder, recurrent, moderate: Secondary | ICD-10-CM

## 2016-03-08 NOTE — Patient Instructions (Signed)
Discussed orally 
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
## 2016-03-23 ENCOUNTER — Encounter (HOSPITAL_COMMUNITY): Payer: Self-pay | Admitting: Psychiatry

## 2016-03-23 ENCOUNTER — Ambulatory Visit (INDEPENDENT_AMBULATORY_CARE_PROVIDER_SITE_OTHER): Payer: BLUE CROSS/BLUE SHIELD | Admitting: Psychiatry

## 2016-03-23 DIAGNOSIS — F331 Major depressive disorder, recurrent, moderate: Secondary | ICD-10-CM | POA: Diagnosis not present
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
## 2016-04-06 ENCOUNTER — Encounter (HOSPITAL_COMMUNITY): Payer: Self-pay | Admitting: Psychiatry

## 2016-04-06 ENCOUNTER — Ambulatory Visit (INDEPENDENT_AMBULATORY_CARE_PROVIDER_SITE_OTHER): Payer: BLUE CROSS/BLUE SHIELD | Admitting: Psychiatry

## 2016-04-06 DIAGNOSIS — F331 Major depressive disorder, recurrent, moderate: Secondary | ICD-10-CM | POA: Diagnosis not present
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
## 2016-04-08 ENCOUNTER — Other Ambulatory Visit: Payer: Self-pay | Admitting: Cardiology

## 2016-04-20 ENCOUNTER — Ambulatory Visit (HOSPITAL_COMMUNITY): Payer: Self-pay | Admitting: Psychiatry

## 2016-05-04 ENCOUNTER — Ambulatory Visit (HOSPITAL_COMMUNITY): Payer: Self-pay | Admitting: Psychiatry

## 2016-05-06 ENCOUNTER — Encounter (HOSPITAL_COMMUNITY): Payer: Self-pay | Admitting: Psychiatry

## 2016-05-06 ENCOUNTER — Ambulatory Visit (INDEPENDENT_AMBULATORY_CARE_PROVIDER_SITE_OTHER): Payer: BLUE CROSS/BLUE SHIELD | Admitting: Psychiatry

## 2016-05-06 DIAGNOSIS — F331 Major depressive disorder, recurrent, moderate: Secondary | ICD-10-CM
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
## 2016-05-26 ENCOUNTER — Ambulatory Visit (HOSPITAL_COMMUNITY): Payer: Self-pay | Admitting: Psychiatry

## 2016-06-03 ENCOUNTER — Ambulatory Visit (HOSPITAL_COMMUNITY): Payer: Self-pay | Admitting: Psychiatry

## 2016-06-09 ENCOUNTER — Other Ambulatory Visit: Payer: Self-pay | Admitting: Cardiology

## 2016-06-21 ENCOUNTER — Telehealth (HOSPITAL_COMMUNITY): Payer: Self-pay | Admitting: *Deleted

## 2016-06-21 ENCOUNTER — Ambulatory Visit (HOSPITAL_COMMUNITY): Payer: Self-pay | Admitting: Psychiatry

## 2016-06-22 ENCOUNTER — Ambulatory Visit (INDEPENDENT_AMBULATORY_CARE_PROVIDER_SITE_OTHER): Payer: BLUE CROSS/BLUE SHIELD | Admitting: Psychiatry

## 2016-06-22 ENCOUNTER — Encounter (HOSPITAL_COMMUNITY): Payer: Self-pay | Admitting: Psychiatry

## 2016-06-22 DIAGNOSIS — F331 Major depressive disorder, recurrent, moderate: Secondary | ICD-10-CM
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
## 2016-07-07 ENCOUNTER — Other Ambulatory Visit: Payer: Self-pay | Admitting: Cardiology

## 2016-07-08 ENCOUNTER — Ambulatory Visit (HOSPITAL_COMMUNITY): Payer: Self-pay | Admitting: Psychiatry

## 2016-07-12 ENCOUNTER — Encounter (HOSPITAL_COMMUNITY): Payer: Self-pay | Admitting: Psychiatry

## 2016-07-12 ENCOUNTER — Ambulatory Visit (INDEPENDENT_AMBULATORY_CARE_PROVIDER_SITE_OTHER): Payer: BLUE CROSS/BLUE SHIELD | Admitting: Psychiatry

## 2016-07-12 DIAGNOSIS — F331 Major depressive disorder, recurrent, moderate: Secondary | ICD-10-CM
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
## 2016-08-10 ENCOUNTER — Ambulatory Visit (INDEPENDENT_AMBULATORY_CARE_PROVIDER_SITE_OTHER): Payer: BLUE CROSS/BLUE SHIELD | Admitting: Psychiatry

## 2016-08-10 ENCOUNTER — Encounter (HOSPITAL_COMMUNITY): Payer: Self-pay | Admitting: Psychiatry

## 2016-08-10 DIAGNOSIS — F331 Major depressive disorder, recurrent, moderate: Secondary | ICD-10-CM | POA: Diagnosis not present
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
## 2016-08-30 ENCOUNTER — Encounter: Payer: Self-pay | Admitting: *Deleted

## 2016-08-30 ENCOUNTER — Encounter: Payer: Self-pay | Admitting: Cardiology

## 2016-08-30 ENCOUNTER — Ambulatory Visit (INDEPENDENT_AMBULATORY_CARE_PROVIDER_SITE_OTHER): Payer: BLUE CROSS/BLUE SHIELD | Admitting: Cardiology

## 2016-08-30 VITALS — BP 130/73 | HR 107 | Ht 64.0 in | Wt 290.4 lb

## 2016-08-30 DIAGNOSIS — G4733 Obstructive sleep apnea (adult) (pediatric): Secondary | ICD-10-CM | POA: Diagnosis not present

## 2016-08-30 DIAGNOSIS — I10 Essential (primary) hypertension: Secondary | ICD-10-CM | POA: Diagnosis not present

## 2016-08-30 DIAGNOSIS — I428 Other cardiomyopathies: Secondary | ICD-10-CM

## 2016-08-30 DIAGNOSIS — E782 Mixed hyperlipidemia: Secondary | ICD-10-CM | POA: Diagnosis not present

## 2016-08-30 NOTE — Patient Instructions (Signed)
Medication Instructions:  Continue all current medications.  Labwork: none  Testing/Procedures: none  Referrals:  [facility] - eval for sleep apnea   Follow-Up: Your physician wants you to follow up in:  1 year.  You will receive a reminder letter in the mail one-two months in advance.  If you don't receive a letter, please call our office to schedule the follow up appointment   Any Other Special Instructions Will Be Listed Below (If Applicable).  If you need a refill on your cardiac medications before your next appointment, please call your pharmacy.

## 2016-08-30 NOTE — Progress Notes (Signed)
Clinical Summary Ms. Dittmar is a 56 y.o.female seen today for follow up of the following medical problems.   1. NICM  - 10/2013 echo LVEF [id number], grade I diastolic dysfunction  - cath 10/2013 with patent coronaries - repeat echo 02/2014 after course of medical therapy showed normalization of LVEF at 60-65%.   - no recent SOB or DOE. Occasional LE edema.    2. HTN  - home bp's run around 110-130s/60-70s.  - compliant with meds. No recurrent low bp's since we lowered her lisionpril dose.  3. OSA - sleep study showed moderate OSA. She has elected not to undergo CPAP testing, however now is considering. She is concerned about wearing the mask due to her claustrophobia  4. Hyperlipidemia - compliant withs tatin - she reports recent labs with pcp    SH:  Getting ready to move to [place].  Past Medical History:  Diagnosis Date  . Chronic back pain   . Diabetes mellitus, type II ([place])   . Essential hypertension, benign   . Hypercholesteremia   . Neuromuscular disorder (HCC)    Numbness in right hand and left foot     Allergies  Allergen Reactions  . Bee Venom Anaphylaxis and Hives     Current Outpatient Prescriptions  Medication Sig Dispense Refill  . allopurinol (ZYLOPRIM) 100 MG tablet Take 1 tab by mouth twice daily    . aspirin 81 MG tablet Take 81 mg by mouth daily.    Marland Kitchen atorvastatin (LIPITOR) 20 MG tablet Take 20 mg by mouth daily.    . Calcium Carb-Cholecalciferol (CALCIUM + D3) 600-200 MG-UNIT TABS Take 1 tablet by mouth 2 (two) times daily.    . carvedilol (COREG) 25 MG tablet Take 1 tablet (25 mg total) by mouth 2 (two) times daily. 180 tablet 3  . colchicine 0.6 MG tablet Take 2 tabs by mouth once. If not better take 1 tab in 1 hour. Do not repeat for 3 days.    Marland Kitchen EPINEPHrine (EPI-PEN) 0.3 mg/0.3 mL SOAJ injection Inject 0.3 mg into the muscle once.    . furosemide (LASIX) 40 MG tablet TAKE ONE AND ONE-HALF TABLETS TWICE A DAY 270 tablet 0  .  gabapentin (NEURONTIN) 300 MG capsule Take 300 mg by mouth 3 (three) times daily.     Marland Kitchen KRILL OIL PO Take by mouth daily.    Marland Kitchen lisinopril (PRINIVIL,ZESTRIL) 20 MG tablet TAKE 1 TABLET DAILY 90 tablet 0  . metFORMIN (GLUCOPHAGE) 1000 MG tablet Take 1 tablet by mouth 2 (two) times daily.    . Misc Natural Products (TART CHERRY ADVANCED PO) Take 1,200 mg by mouth daily.    Marland Kitchen OVER THE COUNTER MEDICATION Place 1 drop into both eyes daily as needed (dryness).    Marland Kitchen spironolactone (ALDACTONE) 25 MG tablet Take 0.5 tablets (12.5 mg total) by mouth 2 (two) times daily. 30 tablet 5  . vitamin B-12 (CYANOCOBALAMIN) 1000 MCG tablet Take 1,000 mcg by mouth daily.     No current facility-administered medications for this visit.      Past Surgical History:  Procedure Laterality Date  . BACK SURGERY     Lumbar fusion  . COLONOSCOPY  06/02/2012   Procedure: COLONOSCOPY;  Surgeon: Danie Binder, MD;  [facility];  Service: Endoscopy;  Laterality: N/A;  8:30 AM  . LEFT AND RIGHT HEART CATHETERIZATION WITH CORONARY ANGIOGRAM N/A 10/24/2013   Procedure: LEFT AND RIGHT HEART CATHETERIZATION WITH CORONARY ANGIOGRAM;  Surgeon: Conception Oms  Hassell Done, MD;  Location: [facility] CATH LAB;  Service: Cardiovascular;  Laterality: N/A;  . TUBAL LIGATION       Allergies  Allergen Reactions  . Bee Venom Anaphylaxis and Hives      Family History  Problem Relation Age of Onset  . Prostate cancer Father   . Depression Mother   . Schizophrenia Sister   . Depression Sister   . Colon cancer Neg Hx      Social History Ms. Feasel reports that she has never smoked. She has never used smokeless tobacco. Ms. Degarmo reports that she drinks alcohol.   Review of Systems CONSTITUTIONAL: No weight loss, fever, chills, weakness or fatigue.  HEENT: Eyes: No visual loss, blurred vision, double vision or yellow sclerae.No hearing loss, sneezing, congestion, runny nose or sore throat.  SKIN: No rash or itching.    CARDIOVASCULAR: per HPI RESPIRATORY: No shortness of breath, cough or sputum.  GASTROINTESTINAL: No anorexia, nausea, vomiting or diarrhea. No abdominal pain or blood.  GENITOURINARY: No burning on urination, no polyuria NEUROLOGICAL: No headache, dizziness, syncope, paralysis, ataxia, numbness or tingling in the extremities. No change in bowel or bladder control.  MUSCULOSKELETAL: No muscle, back pain, joint pain or stiffness.  LYMPHATICS: No enlarged nodes. No history of splenectomy.  PSYCHIATRIC: No history of depression or anxiety.  ENDOCRINOLOGIC: No reports of sweating, cold or heat intolerance. No polyuria or polydipsia.  Marland Kitchen   Physical Examination Vitals:   08/30/16 1311  BP: 130/73  Pulse: (!) 107   Vitals:   08/30/16 1311  Weight: 290 lb 5.8 oz (131.7 kg)  Height: 5\' 4"  (1.626 m)    Gen: resting comfortably, no acute distress HEENT: no scleral icterus, pupils equal round and reactive, no palptable cervical adenopathy,  CV: RRR, no m/r/g, no jvd Resp: Clear to auscultation bilaterally GI: abdomen is soft, non-tender, non-distended, normal bowel sounds, no hepatosplenomegaly MSK: extremities are warm, no edema.  Skin: warm, no rash Neuro:  no focal deficits Psych: appropriate affect   Diagnostic Studies  10/2013 Echo  Study Conclusions  - Left ventricle: The cavity size was normal. Wall thickness was increased in a pattern of mild LVH. Systolic function was severely reduced. The estimated ejection fraction was in the range of 25% to 30%. Possible severe hypokinesis of the anteroseptal myocardium. Doppler parameters are consistent with abnormal left ventricular relaxation (grade 1 diastolic dysfunction). - Aortic valve: Poorly visualized. Mildly calcified annulus. Probably trileaflet. No significant regurgitation. - Right ventricle: Systolic function was reduced. - Right atrium: Central venous pressure: 102mm Hg (est). - Tricuspid valve: Physiologic  regurgitation. - Pulmonary arteries: Systolic pressure could not be accurately estimated. - Pericardium, extracardiac: A prominent pericardial fat pad was present. Impressions:  - Extremely limited study. There is mild LVH, overall normal LV chamber size, LVEF roughly estimated at 25-30% based on available images. There appears to be significant anteroseptal hypokinesis based on some views. Optison contrast may provide better images. There is grade 1 diastolic dysfunction. RV contraction is abnormal as well. No significant degree of mitral or tricuspid regurgitation noted, cannotassess PASP. CVP appears normal. Epicardial fat pad noted.  10/2013 Cath  Procedural Findings:  Hemodynamics:  AO: 172/87 mmHg  LV: 165/20 mmHg  LVEDP: 25 mmHg  Coronary angiography:  Coronary dominance: right   Left Main: normal   Left Anterior Descending (LAD): Normal in size with no significant disease.   1st diagonal (D1): Normal in size with no significant disease.   2nd diagonal (D2): Large in  size with no significant disease.   3rd diagonal (D3): Normal in size with no significant disease.   Circumflex (LCx): Normal in size and nondominant the vessel is free of any significant disease.   1st obtuse marginal: Small in size with minor irregularities.   2nd obtuse marginal: Normal in size with no significant disease.   3rd obtuse marginal: Normal in size with no significant disease   Right Coronary Artery: normal in size and dominant the vessel is free of any significant disease.   Posterior descending artery: large in size with no significant disease   Posterior AV segment: normal in size with no significant disease.   Posterolateral branchs: 3 normal size branchs which are free of significant disease. Left ventriculography: Left ventricular systolic function is severely reduced , LVEF is estimated at 25-30 %, there is no significant mitral regurgitation  Final  Conclusions:  1. Normal coronary arteries.  2. Severely reduced LV systolic function with an ejection fraction of 25-30% due to nonischemic cardiomyopathy.  3. Moderately elevated left ventricular end-diastolic pressure  Recommendations:  Continue diuresis. Treat medically for nonischemic cardiomyopathy. Blood pressure control is recommended. The patient should be screened for sleep apnea and treated as indicated.    02/2014 Echo Study Conclusions  - Procedure narrative: Transthoracic echocardiography. Image quality was adequate. The study was technically difficult, as a result of poor sound wave transmission and body habitus. Intravenous contrast (Optison) was administered. - Left ventricle: The cavity size was normal. Wall thickness was normal. Systolic function was normal. The estimated ejection fraction was in the range of 60% to 65%. Wall motion was normal; there were no regional wall motion abnormalities. Left ventricular diastolic function parameters were normal. - Right ventricle: Not well visualized. Grossly appears normal in size and function. RV TAPSE is 2.3 cm.    Assessment and Plan   1. NICM  - initially LVEF 25-30% by echo 10/2013, with medical therapy normalization of LVEF to 60-65%.  - no recent symptoms - continue current meds - EKG in clinic SR, no acute ischemic changes . 2. HTN  - bp is at goal, continue current meds   3. OSA - intially did not want to consider CPAP, however she is willing to discuss further with pulmonary. We will refer to Dr Luan Pulling to address.  4. Hyperlipidemia - request labs from pcp - continue ststin   F/u 1 year. Request labs from pcp     Arnoldo Lenis, M.D.

## 2016-09-07 ENCOUNTER — Other Ambulatory Visit: Payer: Self-pay | Admitting: Cardiology

## 2016-09-20 ENCOUNTER — Other Ambulatory Visit: Payer: Self-pay | Admitting: Internal Medicine

## 2016-09-20 DIAGNOSIS — Z1231 Encounter for screening mammogram for malignant neoplasm of breast: Secondary | ICD-10-CM

## 2016-09-27 ENCOUNTER — Ambulatory Visit
Admission: RE | Admit: 2016-09-27 | Discharge: 2016-09-27 | Disposition: A | Discharge status: Home / Self Care | Payer: BLUE CROSS/BLUE SHIELD | Source: Ambulatory Visit | Attending: Internal Medicine | Admitting: Internal Medicine

## 2016-09-27 DIAGNOSIS — Z1231 Encounter for screening mammogram for malignant neoplasm of breast: Secondary | ICD-10-CM | POA: Diagnosis not present

## 2016-10-01 IMAGING — MG MM DIGITAL SCREENING BILAT W/ CAD
7 series · 7 of 7 positions shown · non-contrast
Comparison: Previous exam(s)

ACR Breast Density Category a: The breast tissue is almost entirely
fatty.

CLINICAL DATA: Screening.

EXAM:
DIGITAL SCREENING BILATERAL MAMMOGRAM WITH CAD

[R MLO (1 of 2)]
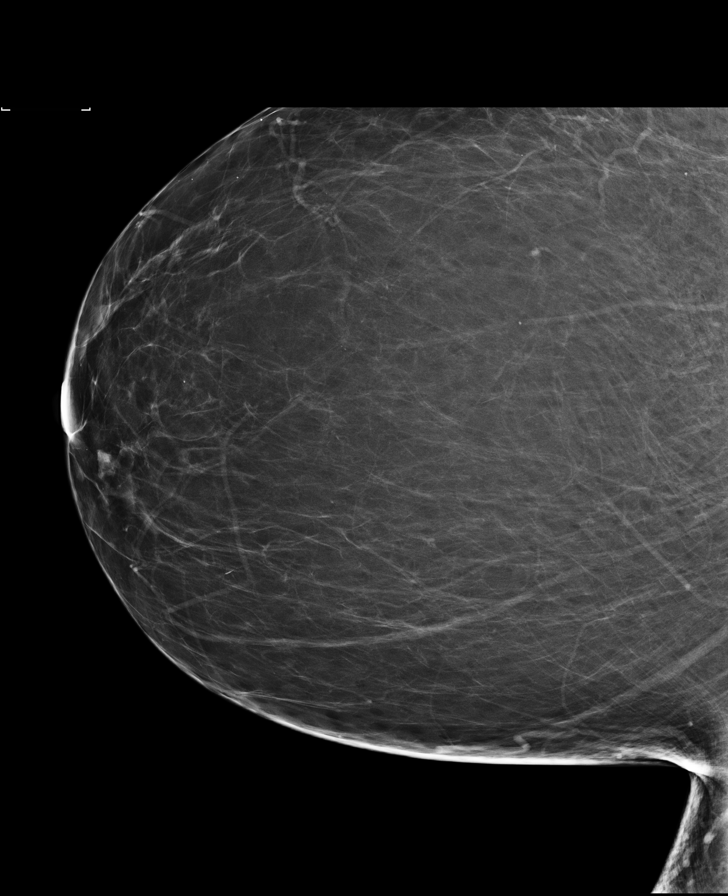

[L MLO (1 of 2)]
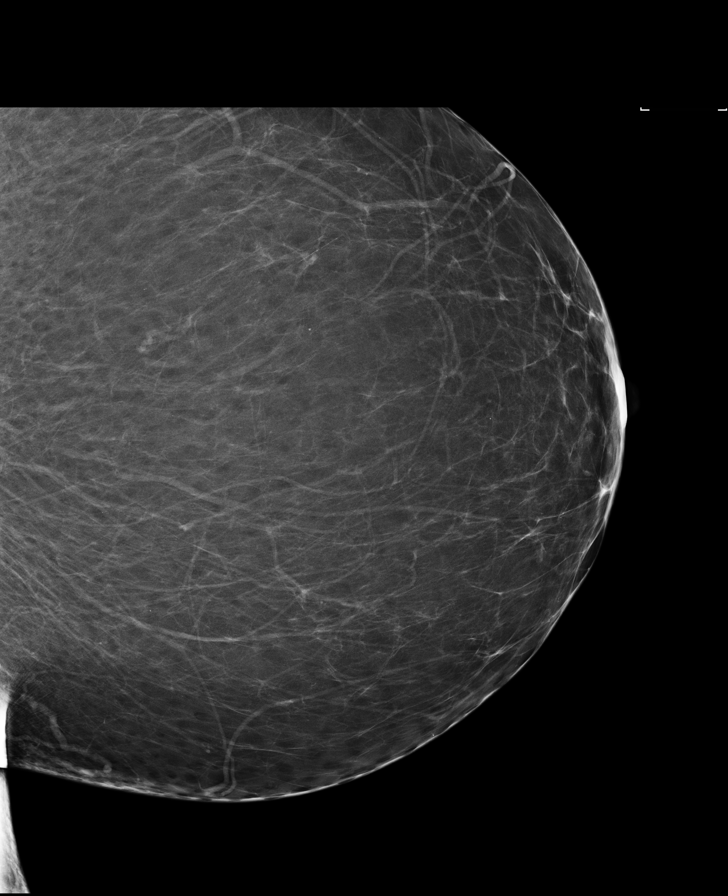

[R CV]
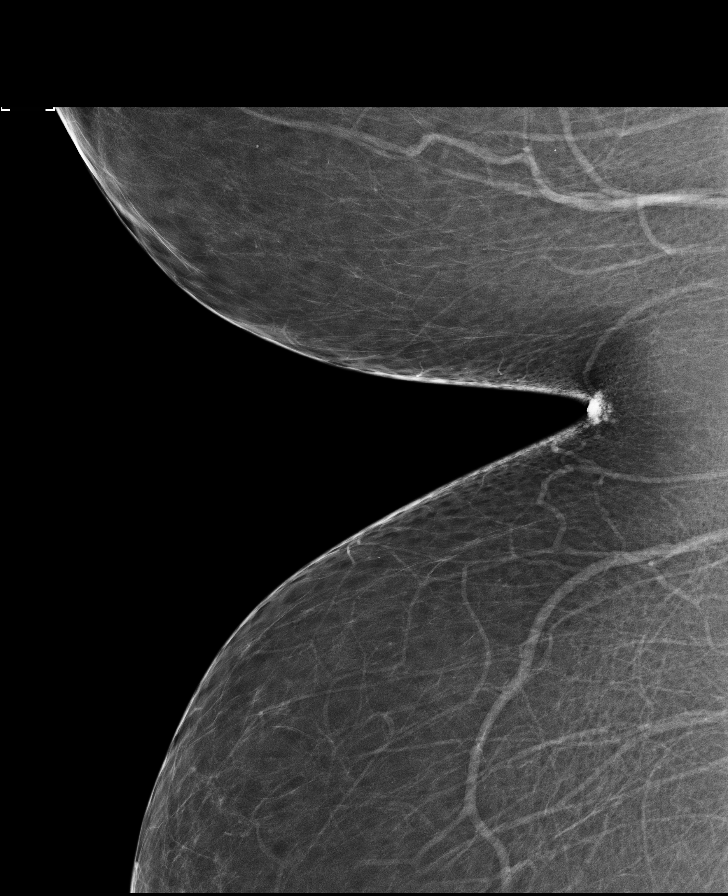

[R MLO (2 of 2)]
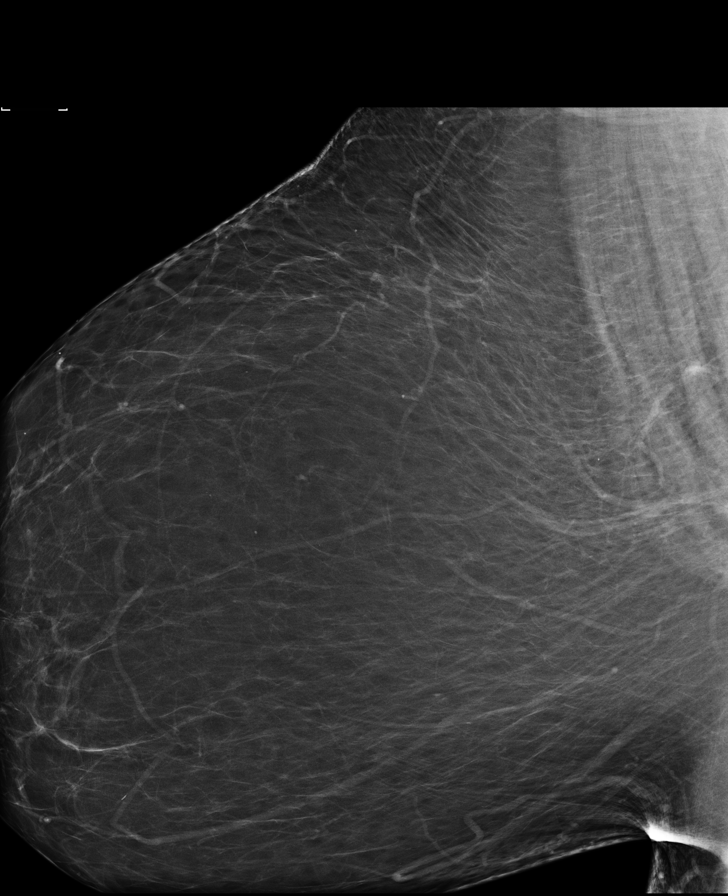

[L MLO (2 of 2)]
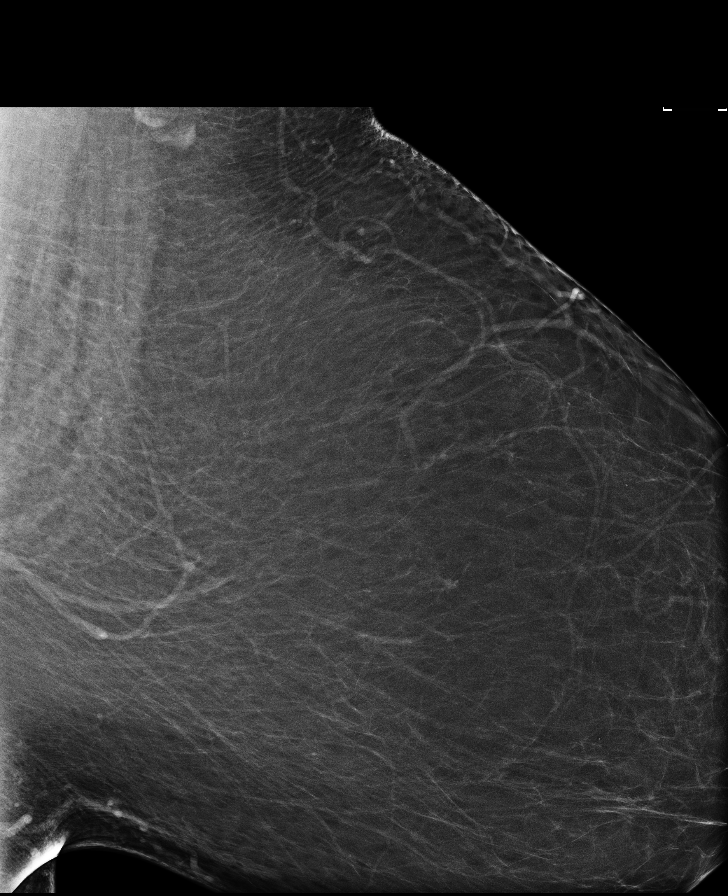

[R CC]
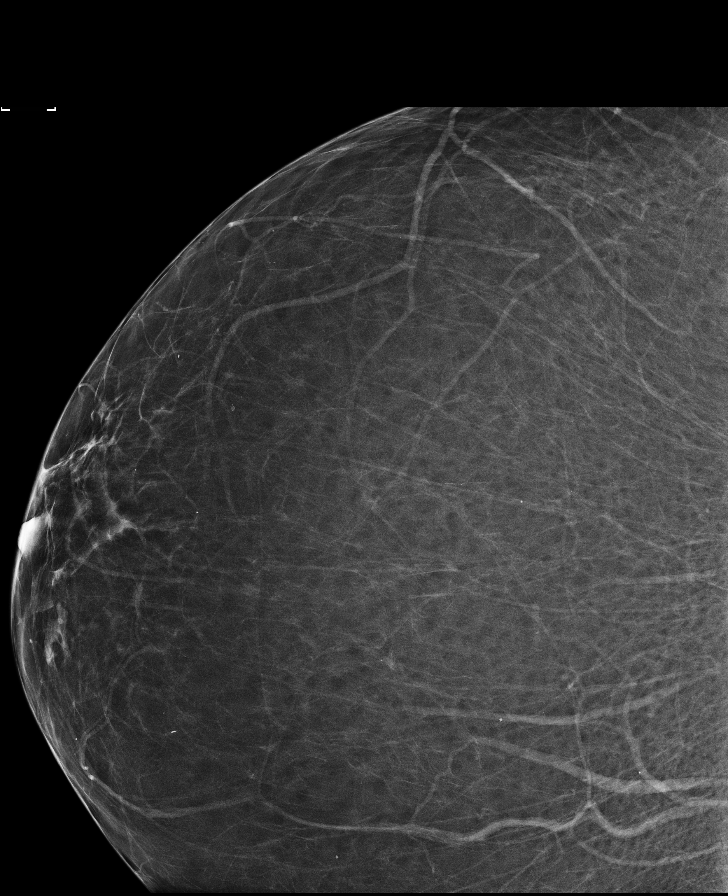

[L CC]
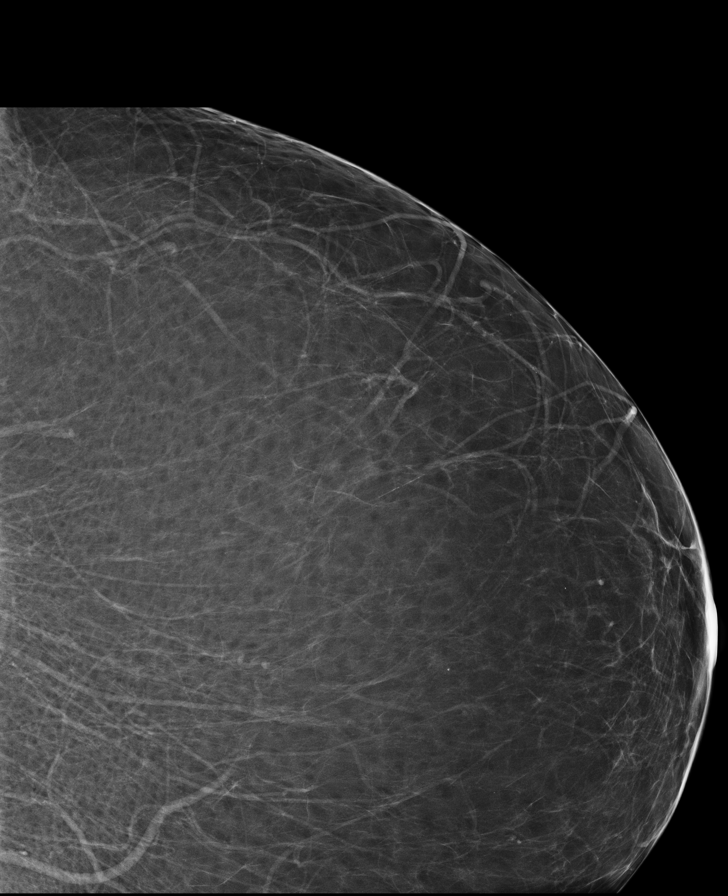

[7 of 7 positions shown; findings below may reference images not displayed]

FINDINGS: There are no findings suspicious for malignancy. Images were
processed with CAD.
IMPRESSION: No mammographic evidence of malignancy. A result letter of this
screening mammogram will be mailed directly to the patient.

RECOMMENDATION:
Screening mammogram in one year. (Code:JV-X-DYE)

BI-RADS CATEGORY  1: Negative.

## 2016-12-06 ENCOUNTER — Other Ambulatory Visit: Payer: Self-pay | Admitting: Cardiology

## 2017-05-02 ENCOUNTER — Encounter: Payer: Self-pay | Admitting: Gastroenterology

## 2017-09-26 ENCOUNTER — Ambulatory Visit: Payer: Self-pay | Admitting: Cardiology

## 2017-10-31 ENCOUNTER — Other Ambulatory Visit: Payer: Self-pay | Admitting: Family Medicine

## 2017-10-31 DIAGNOSIS — Z0000 Encounter for general adult medical examination without abnormal findings: Secondary | ICD-10-CM

## 2017-11-23 ENCOUNTER — Ambulatory Visit
Admission: RE | Admit: 2017-11-23 | Discharge: 2017-11-23 | Disposition: A | Discharge status: Home / Self Care | Payer: BLUE CROSS/BLUE SHIELD | Source: Ambulatory Visit | Attending: Family Medicine | Admitting: Family Medicine

## 2017-11-23 DIAGNOSIS — Z1231 Encounter for screening mammogram for malignant neoplasm of breast: Secondary | ICD-10-CM | POA: Diagnosis present

## 2017-11-23 DIAGNOSIS — Z0000 Encounter for general adult medical examination without abnormal findings: Secondary | ICD-10-CM

## 2017-11-24 ENCOUNTER — Encounter: Payer: Self-pay | Admitting: Cardiology

## 2017-11-24 ENCOUNTER — Ambulatory Visit: Payer: BLUE CROSS/BLUE SHIELD | Admitting: Cardiology

## 2017-11-24 VITALS — BP 144/78 | HR 94 | Ht 64.0 in | Wt 277.0 lb

## 2017-11-24 DIAGNOSIS — G4730 Sleep apnea, unspecified: Secondary | ICD-10-CM | POA: Diagnosis not present

## 2017-11-24 DIAGNOSIS — I10 Essential (primary) hypertension: Secondary | ICD-10-CM

## 2017-11-24 DIAGNOSIS — E782 Mixed hyperlipidemia: Secondary | ICD-10-CM | POA: Diagnosis not present

## 2017-11-24 DIAGNOSIS — I428 Other cardiomyopathies: Secondary | ICD-10-CM | POA: Diagnosis not present

## 2017-11-24 NOTE — Progress Notes (Signed)
Clinical Summary Ms. Carrell is a 57 y.o.female seen today for follow up of the following medical problems.   1. NICM  - 10/2013 echo LVEF 02-40%, grade I diastolic dysfunction  - cath 10/2013 with patent coronaries - repeat echo 02/2014 after course of medical therapy showed normalization of LVEF at 60-65%.   -No recent SOB/DOE. No LE edema - compliant with meds.    2. HTN  - home bp's 120-130s/60-70s - compliant with meds.    3. OSA - sleep study showed moderate OSA. Does not appear she had CPAP study - has not been able to follow up for appt.   4. Hyperlipidemia - remains compliant with statin    SH:  Getting ready to move to [place].     Past Medical History:  Diagnosis Date  . Chronic back pain   . Diabetes mellitus, type II ([place])   . Essential hypertension, benign   . Hypercholesteremia   . Neuromuscular disorder (HCC)    Numbness in right hand and left foot     Allergies  Allergen Reactions  . Bee Venom Anaphylaxis and Hives     Current Outpatient Medications  Medication Sig Dispense Refill  . allopurinol (ZYLOPRIM) 100 MG tablet Take 1 tab by mouth twice daily    . aspirin 81 MG tablet Take 81 mg by mouth daily.    Marland Kitchen atorvastatin (LIPITOR) 20 MG tablet Take 20 mg by mouth daily.    . carvedilol (COREG) 25 MG tablet Take 1 tablet (25 mg total) by mouth 2 (two) times daily. 180 tablet 3  . colchicine 0.6 MG tablet Take 2 tabs by mouth once. If not better take 1 tab in 1 hour. Do not repeat for 3 days.    Marland Kitchen EPINEPHrine (EPI-PEN) 0.3 mg/0.3 mL SOAJ injection Inject 0.3 mg into the muscle once.    . furosemide (LASIX) 40 MG tablet TAKE ONE AND ONE-HALF TABLETS TWICE A DAY 270 tablet 0  . gabapentin (NEURONTIN) 300 MG capsule Take 300 mg by mouth 3 (three) times daily.     Marland Kitchen KRILL OIL PO Take by mouth daily.    Marland Kitchen liraglutide 18 MG/3ML SOPN Inject 0.1 ml subcutaneous once daily x 7 days then increase to 0.66ml once daily    . lisinopril  (PRINIVIL,ZESTRIL) 20 MG tablet TAKE 1 TABLET DAILY (NEED APPOINTMENT FOR FURTHER REFILLS) 90 tablet 0  . metFORMIN (GLUCOPHAGE) 1000 MG tablet Take 1 tablet by mouth 2 (two) times daily.    Marland Kitchen OVER THE COUNTER MEDICATION Place 1 drop into both eyes daily as needed (dryness).    Marland Kitchen spironolactone (ALDACTONE) 25 MG tablet Take 0.5 tablets (12.5 mg total) by mouth 2 (two) times daily. 30 tablet 5  . vitamin B-12 (CYANOCOBALAMIN) 1000 MCG tablet Take 1,000 mcg by mouth daily.     No current facility-administered medications for this visit.      Past Surgical History:  Procedure Laterality Date  . BACK SURGERY     Lumbar fusion  . COLONOSCOPY  06/02/2012   Procedure: COLONOSCOPY;  Surgeon: Danie Binder, MD;  Location: [facility];  Service: Endoscopy;  Laterality: N/A;  8:30 AM  . LEFT AND RIGHT HEART CATHETERIZATION WITH CORONARY ANGIOGRAM N/A 10/24/2013   Procedure: LEFT AND RIGHT HEART CATHETERIZATION WITH CORONARY ANGIOGRAM;  Surgeon: Jettie Booze, MD;  Location: [facility] CATH LAB;  Service: Cardiovascular;  Laterality: N/A;  . TUBAL LIGATION       Allergies  Allergen Reactions  .  Bee Venom Anaphylaxis and Hives      Family History  Problem Relation Age of Onset  . Prostate cancer Father   . Depression Mother   . Schizophrenia Sister   . Depression Sister   . Colon cancer Neg Hx   . Breast cancer Neg Hx      Social History Ms. Fyfe reports that she has never smoked. She has never used smokeless tobacco. Ms. Amezcua reports that she drinks alcohol.   Review of Systems CONSTITUTIONAL: No weight loss, fever, chills, weakness or fatigue.  HEENT: Eyes: No visual loss, blurred vision, double vision or yellow sclerae.No hearing loss, sneezing, congestion, runny nose or sore throat.  SKIN: No rash or itching.  CARDIOVASCULAR: per hpi RESPIRATORY: No shortness of breath, cough or sputum.  GASTROINTESTINAL: No anorexia, nausea, vomiting or diarrhea. No abdominal pain or  blood.  GENITOURINARY: No burning on urination, no polyuria NEUROLOGICAL: No headache, dizziness, syncope, paralysis, ataxia, numbness or tingling in the extremities. No change in bowel or bladder control.  MUSCULOSKELETAL: No muscle, back pain, joint pain or stiffness.  LYMPHATICS: No enlarged nodes. No history of splenectomy.  PSYCHIATRIC: No history of depression or anxiety.  ENDOCRINOLOGIC: No reports of sweating, cold or heat intolerance. No polyuria or polydipsia.  Marland Kitchen   Physical Examination Vitals:   11/24/17 1432  BP: (!) 144/78  Pulse: 94  SpO2: 92%   Vitals:   11/24/17 1432  Weight: 277 lb (125.6 kg)  Height: 5\' 4"  (1.626 m)    Gen: resting comfortably, no acute distress HEENT: no scleral icterus, pupils equal round and reactive, no palptable cervical adenopathy,  CV: RRR, no m/r/g, no jvd Resp: Clear to auscultation bilaterally GI: abdomen is soft, non-tender, non-distended, normal bowel sounds, no hepatosplenomegaly MSK: extremities are warm, no edema.  Skin: warm, no rash Neuro:  no focal deficits Psych: appropriate affect   Diagnostic Studies  10/2013 Echo Study Conclusions  - Left ventricle: The cavity size was normal. Wall thickness was increased in a pattern of mild LVH. Systolic function was severely reduced. The estimated ejection fraction was in the range of 25% to 30%. Possible severe hypokinesis of the anteroseptal myocardium. Doppler parameters are consistent with abnormal left ventricular relaxation (grade 1 diastolic dysfunction). - Aortic valve: Poorly visualized. Mildly calcified annulus. Probably trileaflet. No significant regurgitation. - Right ventricle: Systolic function was reduced. - Right atrium: Central venous pressure: 42mm Hg (est). - Tricuspid valve: Physiologic regurgitation. - Pulmonary arteries: Systolic pressure could not be accurately estimated. - Pericardium, extracardiac: A prominent pericardial fat pad was  present. Impressions:  - Extremely limited study. There is mild LVH, overall normal LV chamber size, LVEF roughly estimated at 25-30% based on available images. There appears to be significant anteroseptal hypokinesis based on some views. Optison contrast may provide better images. There is grade 1 diastolic dysfunction. RV contraction is abnormal as well. No significant degree of mitral or tricuspid regurgitation noted, cannotassess PASP. CVP appears normal. Epicardial fat pad noted. 10/2013 Cath Procedural Findings: Hemodynamics: AO: 172/87 mmHg  LV: 165/20 mmHg  LVEDP: 25 mmHg  Coronary angiography: Coronary dominance: right   Left Main: normal   Left Anterior Descending (LAD): Normal in size with no significant disease.   1st diagonal (D1): Normal in size with no significant disease.   2nd diagonal (D2): Large in size with no significant disease.   3rd diagonal (D3): Normal in size with no significant disease.   Circumflex (LCx): Normal in size and nondominant the vessel is  free of any significant disease.   1st obtuse marginal: Small in size with minor irregularities.   2nd obtuse marginal: Normal in size with no significant disease.   3rd obtuse marginal: Normal in size with no significant disease   Right Coronary Artery: normal in size and dominant the vessel is free of any significant disease.   Posterior descending artery: large in size with no significant disease   Posterior AV segment: normal in size with no significant disease.   Posterolateral branchs: 3 normal size branchs which are free of significant disease. Left ventriculography: Left ventricular systolic function is severely reduced , LVEF is estimated at 25-30 %, there is no significant mitral regurgitation  Final Conclusions:  1. Normal coronary arteries.  2. Severely reduced LV systolic function with an ejection fraction of 25-30% due to nonischemic cardiomyopathy.   3. Moderately elevated left ventricular end-diastolic pressure  Recommendations:  Continue diuresis. Treat medically for nonischemic cardiomyopathy. Blood pressure control is recommended. The patient should be screened for sleep apnea and treated as indicated.    02/2014 Echo Study Conclusions  - Procedure narrative: Transthoracic echocardiography. Image quality was adequate. The study was technically difficult, as a result of poor sound wave transmission and body habitus. Intravenous contrast (Optison) was administered. - Left ventricle: The cavity size was normal. Wall thickness was normal. Systolic function was normal. The estimated ejection fraction was in the range of 60% to 65%. Wall motion was normal; there were no regional wall motion abnormalities. Left ventricular diastolic function parameters were normal. - Right ventricle: Not well visualized. Grossly appears normal in size and function. RV TAPSE is 2.3 cm.       Assessment and Plan  1. NICM  - initially LVEF 25-30% by echo 10/2013, with medical therapy normalization of LVEF to 60-65%.  -no symptoms, continue current meds - EKG in cl[facility]i shows SR, RAD. No acute ischemic changes.   2. HTN  - elevated in clinic, at goal based on home numbers - continue current meds   3. OSA - intially did not want to consider CPAP, however she is willing to discuss further with pulmonary.  - refer again to pulmonary for evaluation   4. Hyperlipidemia -continue statin, request pcp labs    F/u 1 year.       Arnoldo Lenis, M.D.

## 2017-11-24 NOTE — Patient Instructions (Signed)
Medication Instructions:  Your physician recommends that you continue on your current medications as directed. Please refer to the Current Medication list given to you today.   Labwork: I WILL REQUEST LABS FROM PCP   Testing/Procedures: NONE  Follow-Up: Your physician wants you to follow-up in: 1 YEAR .  You will receive a reminder letter in the mail two months in advance. If you don't receive a letter, please call our office to schedule the follow-up appointment.   Any Other Special Instructions Will Be Listed Below (If Applicable). You have been referred to DR. HAWKINS      If you need a refill on your cardiac medications before your next appointment, please call your pharmacy.

## 2017-11-28 ENCOUNTER — Encounter: Payer: Self-pay | Admitting: Cardiology

## 2018-10-17 ENCOUNTER — Other Ambulatory Visit: Payer: Self-pay | Admitting: Family Medicine

## 2018-10-17 DIAGNOSIS — Z1231 Encounter for screening mammogram for malignant neoplasm of breast: Secondary | ICD-10-CM

## 2018-11-29 ENCOUNTER — Ambulatory Visit: Payer: Self-pay | Admitting: Cardiology

## 2019-05-11 ENCOUNTER — Telehealth: Payer: Self-pay | Admitting: Women's Health

## 2019-05-11 NOTE — Telephone Encounter (Signed)
Called patient regarding appointment scheduled in our office encouraged to come alone to the visit if possible, however, a support person, over age 59, may accompany her  to appointment if assistance is needed for safety or care concerns. Otherwise, support persons should remain outside until the visit is complete.  ° °We ask if you have had any exposure to anyone suspected or confirmed of having COVID-19 or if you are experiencing any of the following, to call and reschedule your appointment: fever, cough, shortness of breath, muscle pain, diarrhea, rash, vomiting, abdominal pain, red eye, weakness, bruising, bleeding, joint pain, or a severe headache.  ° °Please know we will ask you these questions or similar questions when you arrive for your appointment and again it’s how we are keeping everyone safe.   ° °Also,to keep you safe, please use the provided hand sanitizer when you enter the office. We are asking everyone in the office to wear a mask to help prevent the spread of °germs. If you have a mask of your own, please wear it to your appointment, if not, we are happy to provide one for you. ° °Thank you for understanding and your cooperation.  ° ° °[facility] Staff ° ° ° °

## 2019-05-14 ENCOUNTER — Ambulatory Visit: Payer: BC Managed Care – PPO | Admitting: Women's Health

## 2019-05-14 ENCOUNTER — Encounter: Payer: Self-pay | Admitting: Women's Health

## 2019-05-14 ENCOUNTER — Other Ambulatory Visit: Payer: Self-pay

## 2019-05-14 VITALS — Ht 64.0 in | Wt 256.4 lb

## 2019-05-14 DIAGNOSIS — D259 Leiomyoma of uterus, unspecified: Secondary | ICD-10-CM | POA: Diagnosis not present

## 2019-05-14 DIAGNOSIS — N950 Postmenopausal bleeding: Secondary | ICD-10-CM

## 2019-05-14 NOTE — Patient Instructions (Addendum)
Endometrial Biopsy  Endometrial biopsy is a procedure in which a tissue sample is taken from inside the uterus. The sample is taken from the endometrium, which is the lining of the uterus. The tissue sample is then checked under a microscope to see if the tissue is normal or abnormal. This procedure helps to determine where you are in your menstrual cycle and how hormone levels are affecting the lining of the uterus. This procedure may also be used to evaluate uterine bleeding or to diagnose endometrial cancer, endometrial tuberculosis, polyps, or other inflammatory conditions. Tell a health care provider about:  Any allergies you have.  All medicines you are taking, including vitamins, herbs, eye drops, creams, and over-the-counter medicines.  Any problems you or family members have had with anesthetic medicines.  Any blood disorders you have.  Any surgeries you have had.  Any medical conditions you have.  Whether you are pregnant or may be pregnant. What are the risks? Generally, this is a safe procedure. However, problems may occur, including:  Bleeding.  Pelvic infection.  Puncture of the wall of the uterus with the biopsy device (rare). What happens before the procedure?  Keep a record of your menstrual cycles as told by your health care provider. You may need to schedule your procedure for a specific time in your cycle.  You may want to bring a sanitary pad to wear after the procedure.  Ask your health care provider about: ? Changing or stopping your regular medicines. This is especially important if you are taking diabetes medicines or blood thinners. ? Taking medicines such as aspirin and ibuprofen. These medicines can thin your blood. Do not take these medicines before your procedure if your health care provider instructs you not to.  Plan to have someone take you home from the hospital or clinic. What happens during the procedure?  To lower your risk of  infection: ? Your health care team will wash or sanitize their hands.  You will lie on an exam table with your feet and legs supported as in a pelvic exam.  Your health care provider will insert an instrument (speculum) into your vagina to see your cervix.  Your cervix will be cleansed with an antiseptic solution.  A medicine (local anesthetic) will be used to numb the cervix.  A forceps instrument (tenaculum) will be used to hold your cervix steady for the biopsy.  A thin, rod-like instrument (uterine sound) will be inserted through your cervix to determine the length of your uterus and the location where the biopsy sample will be removed.  A thin, flexible tube (catheter) will be inserted through your cervix and into the uterus. The catheter will be used to collect the biopsy sample from your endometrial tissue.  The catheter and speculum will then be removed, and the tissue sample will be sent to a lab for examination. What happens after the procedure?  You will rest in a recovery area until you are ready to go home.  You may have mild cramping and a small amount of vaginal bleeding. This is normal.  It is up to you to get the results of your procedure. Ask your health care provider, or the department that is doing the procedure, when your results will be ready. Summary  Endometrial biopsy is a procedure in which a tissue sample is taken from the endometrium, which is the lining of the uterus.  This procedure may help to diagnose menstrual cycle problems, abnormal bleeding, or other conditions affecting   the endometrium.  Before the procedure, keep a record of your menstrual cycles as told by your health care provider.  The tissue sample that is removed will be checked under a microscope to see if it is normal or abnormal. This information is not intended to replace advice given to you by your health care provider. Make sure you discuss any questions you have with your health care  provider. Document Released: 12/10/2004 Document Revised: 07/22/2017 Document Reviewed: 08/25/2016 Elsevier Patient Education  2020 [employer] Uterine Fibroids  Uterine fibroids (leiomyomas) are noncancerous (benign) tumors that can develop in the uterus. Fibroids may also develop in the fallopian tubes, cervix, or tissues (ligaments) near the uterus. You may have one or many fibroids. Fibroids vary in size, weight, and where they grow in the uterus. Some can become quite large. Most fibroids do not require medical treatment. What are the causes? The cause of this condition is not known. What increases the risk? You are more likely to develop this condition if you:  Are in your 30s or 40s and have not gone through menopause.  Have a family history of this condition.  Are of African-American descent.  Had your first period at an early age (early menarche).  Have not had any children (nulliparity).  Are overweight or obese. What are the signs or symptoms? Many women do not have any symptoms. Symptoms of this condition may include:  Heavy menstrual bleeding.  Bleeding or spotting between periods.  Pain and pressure in the pelvic area, between the hips.  Bladder problems, such as needing to urinate urgently or more often than usual.  Inability to have children (infertility).  Failure to carry pregnancy to term (miscarriage). How is this diagnosed? This condition may be diagnosed based on:  Your symptoms and medical history.  A physical exam.  A pelvic exam that includes feeling for any tumors.  Imaging tests, such as ultrasound or MRI. How is this treated? Treatment for this condition may include:  Seeing your health care provider for follow-up visits to monitor your fibroids for any changes.  Taking NSAIDs such as ibuprofen, naproxen, or aspirin to reduce pain.  Hormone medicines. These may be taken as a pill, given in an injection, or delivered by a T-shaped  device that is inserted into the uterus (intrauterine device, IUD).  Surgery to remove one of the following: ? The fibroids (myomectomy). Your health care provider may recommend this if fibroids affect your fertility and you want to become pregnant. ? The uterus (hysterectomy). ? Blood supply to the fibroids (uterine artery embolization). Follow these instructions at home:  Take over-the-counter and prescription medicines only as told by your health care provider.  Ask your health care provider if you should take iron pills or eat more iron-rich foods, such as dark green, leafy vegetables. Heavy menstrual bleeding can cause low iron levels.  If directed, apply heat to your back or abdomen to reduce pain. Use the heat source that your health care provider recommends, such as a moist heat pack or a heating pad. ? Place a towel between your skin and the heat source. ? Leave the heat on for 20-30 minutes. ? Remove the heat if your skin turns bright red. This is especially important if you are unable to feel pain, heat, or cold. You may have a greater risk of getting burned.  Pay close attention to your menstrual cycle. Tell your health care provider about any changes, such as: ? Increased blood flow that   requires you to use more pads or tampons than usual. ? A change in the number of days that your period lasts. ? A change in symptoms that are associated with your period, such as back pain or cramps in your abdomen.  Keep all follow-up visits as told by your health care provider. This is important, especially if your fibroids need to be monitored for any changes. Contact a health care provider if you:  Have pelvic pain, back pain, or cramps in your abdomen that do not get better with medicine or heat.  Develop new bleeding between periods.  Have increased bleeding during or between periods.  Feel unusually tired or weak.  Feel light-headed. Get help right away if you:  Faint.  Have  pelvic pain that suddenly gets worse.  Have severe vaginal bleeding that soaks a tampon or pad in 30 minutes or less. Summary  Uterine fibroids are noncancerous (benign) tumors that can develop in the uterus.  The exact cause of this condition is not known.  Most fibroids do not require medical treatment unless they affect your ability to have children (fertility).  Contact a health care provider if you have pelvic pain, back pain, or cramps in your abdomen that do not get better with medicines.  Make sure you know what symptoms should cause you to get help right away. This information is not intended to replace advice given to you by your health care provider. Make sure you discuss any questions you have with your health care provider. Document Released: 08/06/2000 Document Revised: 07/22/2017 Document Reviewed: 07/05/2017 Elsevier Patient Education  2020 Elsevier Inc.  

## 2019-05-14 NOTE — Progress Notes (Signed)
   GYN VISIT Patient name: Bridget Little MRN [id number]  Date of birth: June 02, 1961 Chief Complaint:   Fibroids  History of Present Illness:   Bridget Little is a 58 y.o. G39P2002 African American female being seen today for report of uterine fibroids. Menopause at 58yo. Had BTL. Has lived in [facility] for last 3 years, started having intermittent vaginal bleeding few months before June, finally had it checked out in June. Bleeding has since stopped. 02/20/19 u/s in [facility]: uterus 11x6.9x12.3cm, endometrial strip 9mm w/ homogenous echotexture, ovaries not visualized- uterus heterogenous enlarged w/ numerous fibroids, largest 3.9cm. Patient's last menstrual period was 05/19/2012. The current method of family planning is post menopausal status and tubal ligation Last pap 3-88yrs ago. Results were:  normal Review of Systems:   Pertinent items are noted in HPI Denies fever/chills, dizziness, headaches, visual disturbances, fatigue, shortness of breath, chest pain, abdominal pain, vomiting, abnormal vaginal discharge/itching/odor/irritation, problems with periods, bowel movements, urination, or intercourse unless otherwise stated above.  Pertinent History Reviewed:  Reviewed past medical,surgical, social, obstetrical and family history.  Reviewed problem list, medications and allergies. Physical Assessment:   Vitals:   05/14/19 1550  Weight: 256 lb 6.4 oz (116.3 kg)  Height: 5\' 4"  (1.626 m)  Body mass index is 44.01 kg/m.       Physical Examination:   General appearance: alert, well appearing, and in no distress  Mental status: alert, oriented to person, place, and time  Skin: warm & dry   Cardiovascular: normal heart rate noted  Respiratory: normal respiratory effort, no distress  Abdomen: soft, non-tender   Pelvic: deferred  Extremities: no edema   No results found for this or any previous visit (from the past 24 hour(s)).  Assessment & Plan:  1) Enlarged uterus w/ multiple fibroids and  intermittent post menopausal bleeding> discussed w/ J[facility], will schedule for endometrial biopsy w/ MD and discuss options  2) Past due for pap> can discuss at next visit  Meds: No orders of the defined types were placed in this encounter.   No orders of the defined types were placed in this encounter.   Return for endometrial biopsy w/ MD, 1st available.  [place], [facility] 05/14/2019 4:22 PM

## 2019-05-23 ENCOUNTER — Telehealth: Payer: Self-pay | Admitting: Obstetrics & Gynecology

## 2019-05-23 NOTE — Telephone Encounter (Signed)

## 2019-05-24 ENCOUNTER — Other Ambulatory Visit (HOSPITAL_COMMUNITY)
Admission: RE | Admit: 2019-05-24 | Discharge: 2019-05-24 | Disposition: A | Discharge status: Home / Self Care | Payer: BC Managed Care – PPO | Source: Ambulatory Visit | Attending: Obstetrics & Gynecology | Admitting: Obstetrics & Gynecology

## 2019-05-24 ENCOUNTER — Ambulatory Visit: Payer: BC Managed Care – PPO | Admitting: Obstetrics & Gynecology

## 2019-05-24 ENCOUNTER — Other Ambulatory Visit: Payer: Self-pay

## 2019-05-24 ENCOUNTER — Encounter: Payer: Self-pay | Admitting: Obstetrics & Gynecology

## 2019-05-24 ENCOUNTER — Other Ambulatory Visit: Payer: Self-pay | Admitting: Obstetrics & Gynecology

## 2019-05-24 VITALS — Ht 64.0 in | Wt 254.0 lb

## 2019-05-24 DIAGNOSIS — N950 Postmenopausal bleeding: Secondary | ICD-10-CM

## 2019-05-24 DIAGNOSIS — N840 Polyp of corpus uteri: Secondary | ICD-10-CM

## 2019-05-24 DIAGNOSIS — Z124 Encounter for screening for malignant neoplasm of cervix: Secondary | ICD-10-CM | POA: Diagnosis not present

## 2019-05-24 NOTE — Addendum Note (Signed)
Addended by: Linton Rump on: 05/24/2019 03:05 PM   Modules accepted: Orders

## 2019-05-24 NOTE — Progress Notes (Signed)
Endometrial Biopsy Procedure Note  Pre-operative Diagnosis: Post menopausal bleeding  Post-operative Diagnosis: same  Indications: postmenopausal bleeding  Procedure Details   Urine pregnancy test was not done.  The risks (including infection, bleeding, pain, and uterine perforation) and benefits of the procedure were explained to the patient and Written informed consent was obtained.  Antibiotic prophylaxis against endocarditis was not indicated.   The patient was placed in the dorsal lithotomy position.  Bimanual exam showed the uterus to be in the neutral position.  A Baquera' speculum inserted in the vagina, and the cervix prepped with povidone iodine.  Endocervical curettage with a Kevorkian curette was not performed.   A sharp tenaculum was applied to the anterior lip of the cervix for stabilization.  A sterile uterine sound was used to sound the uterus to a depth of 6.5cm.  A Pipelle endometrial aspirator was used to sample the endometrium.  Sample was sent for pathologic examination.  Condition: Stable  Complications: None  Plan:  The patient was advised to call for any fever or for prolonged or severe pain or bleeding. She was advised to use OTC ibuprofen as needed for mild to moderate pain. She was advised to avoid vaginal intercourse for 48 hours or until the bleeding has completely stopped.  Attending Physician Documentation: I performed endometrial biopsy

## 2019-05-30 LAB — CYTOLOGY - PAP
Diagnosis: NEGATIVE
High risk HPV: NEGATIVE

## 2019-06-04 ENCOUNTER — Telehealth: Payer: Self-pay | Admitting: *Deleted

## 2019-06-04 ENCOUNTER — Ambulatory Visit (INDEPENDENT_AMBULATORY_CARE_PROVIDER_SITE_OTHER): Payer: BC Managed Care – PPO | Admitting: Obstetrics & Gynecology

## 2019-06-04 ENCOUNTER — Encounter: Payer: Self-pay | Admitting: Obstetrics & Gynecology

## 2019-06-04 ENCOUNTER — Other Ambulatory Visit: Payer: Self-pay

## 2019-06-04 VITALS — Ht 64.0 in | Wt 256.0 lb

## 2019-06-04 DIAGNOSIS — N950 Postmenopausal bleeding: Secondary | ICD-10-CM

## 2019-06-04 NOTE — Telephone Encounter (Signed)
Pt left message saying that she wants results.  She is not able to get into her mychart.

## 2019-06-04 NOTE — Telephone Encounter (Signed)
sure

## 2019-06-05 ENCOUNTER — Encounter: Payer: Self-pay | Admitting: Obstetrics & Gynecology

## 2019-06-05 NOTE — Progress Notes (Signed)
TELEHEALTH VIRTUAL GYNECOLOGY VISIT ENCOUNTER NOTE  I connected with Bridget Little on 06/05/19 at  3:50 PM EDT by telephone at home and verified that I am speaking with the correct person using two identifiers.   I discussed the limitations, risks, security and privacy concerns of performing an evaluation and management service by telephone and the availability of in person appointments. I also discussed with the patient that there may be a patient responsible charge related to this service. The patient expressed understanding and agreed to proceed.   History:  Bridget Little is a 58 y.o. G44P2002 female being evaluated today for follow up on her EMBx. She denies any abnormal vaginal discharge, bleeding, pelvic pain or other concerns.       Past Medical History:  Diagnosis Date  . Chronic back pain   . Diabetes mellitus, type II ([place])   . Essential hypertension, benign   . Hypercholesteremia   . Neuromuscular disorder ([place])    Numbness in right hand and left foot   Past Surgical History:  Procedure Laterality Date  . BACK SURGERY     Lumbar fusion  . COLONOSCOPY  06/02/2012   Procedure: COLONOSCOPY;  Surgeon: Danie Binder, MD;  Location: [facility]ITE;  Service: Endoscopy;  Laterality: N/A;  8:30 AM  . LEFT AND RIGHT HEART CATHETERIZATION WITH CORONARY ANGIOGRAM N/A 10/24/2013   Procedure: LEFT AND RIGHT HEART CATHETERIZATION WITH CORONARY ANGIOGRAM;  Surgeon: Jettie Booze, MD;  Location: [facility] CATH LAB;  Service: Cardiovascular;  Laterality: N/A;  . TUBAL LIGATION     The following portions of the patient's history were reviewed and updated as appropriate: allergies, current medications, past family history, past medical history, past social history, past surgical history and problem list.   Health Maintenance:  Normal pap and negative HRHPV on .  Normal mammogram on 11/2017.   Review of Systems:  Pertinent items noted in HPI and remainder of comprehensive ROS  otherwise negative.  Physical Exam:  Physical exam deferred due to nature of the encounter  Labs and Imaging Results for orders placed or performed in visit on 05/24/19 (from the past 336 hour(s))  Cytology - PAP( [place])   Collection Time: 05/24/19  3:02 PM  Result Value Ref Range   High risk HPV Negative    Adequacy      Satisfactory for evaluation; transformation zone component PRESENT.   Diagnosis      - Negative for intraepithelial lesion or malignancy (NILM)   Endometrial biopsy reveals benign endometrial polyp, no hyperplasia or malignancy     No orders of the defined types were placed in this encounter.   No orders of the defined types were placed in this encounter.   Assessment and Plan:       ICD-10-CM   1. Postmenopausal bleeding  N95.0    Endometrial biopsy reveals benign endometrial polyp, no hyperplasia or malignancy   No follow up needed unless bleeding continues, if it does, will need hysteroscopy uterine curettage      I discussed the assessment and treatment plan with the patient. The patient was provided an opportunity to ask questions and all were answered. The patient agreed with the plan and demonstrated an understanding of the instructions.   The patient was advised to call back or seek an in-person evaluation/go to the ED if the symptoms worsen or if the condition fails to improve as anticipated.  I provided 11 minutes of non-face-to-face time during this encounter.  Florian Buff, [place] for [facility]

## 2019-06-20 ENCOUNTER — Other Ambulatory Visit: Payer: Self-pay

## 2019-06-20 ENCOUNTER — Encounter: Payer: Self-pay | Admitting: Cardiology

## 2019-06-20 ENCOUNTER — Ambulatory Visit: Payer: BLUE CROSS/BLUE SHIELD | Admitting: Cardiology

## 2019-06-20 VITALS — BP 121/76 | HR 92 | Temp 96.2°F | Ht 64.0 in | Wt 254.0 lb

## 2019-06-20 DIAGNOSIS — I10 Essential (primary) hypertension: Secondary | ICD-10-CM

## 2019-06-20 DIAGNOSIS — I428 Other cardiomyopathies: Secondary | ICD-10-CM | POA: Diagnosis not present

## 2019-06-20 DIAGNOSIS — E782 Mixed hyperlipidemia: Secondary | ICD-10-CM | POA: Diagnosis not present

## 2019-06-20 DIAGNOSIS — G4730 Sleep apnea, unspecified: Secondary | ICD-10-CM

## 2019-06-20 NOTE — Progress Notes (Signed)
Clinical Summary Bridget Little is a 58 y.o.female seen today for follow up of the following medical problems.   1. NICM  - 10/2013 echo LVEF 123XX123, grade I diastolic dysfunction  - cath 10/2013 with patent coronaries - repeat echo 02/2014 after course of medical therapy showed normalization of LVEF at 60-65%.   - no SOB/DOE. Occasional LE edema. Takes extra fluid pill as needed which helps.     2. HTN  - home bp's 110s/60s.  - compliant with meds  3. OSA - sleep study showed moderate OSA. Does not appear she had CPAP study - has not been able to get CPAP sorted out    4. Hyperlipidemia - remains compliant with statin  - upcoming labs with pcp   SH: just moved back from [place], here [place] full time.    Past Medical History:  Diagnosis Date  . Chronic back pain   . Diabetes mellitus, type II ([place])   . Essential hypertension, benign   . Hypercholesteremia   . Neuromuscular disorder (HCC)    Numbness in right hand and left foot     Allergies  Allergen Reactions  . Bee Venom Anaphylaxis and Hives     Current Outpatient Medications  Medication Sig Dispense Refill  . aspirin 81 MG tablet Take 81 mg by mouth daily.    Marland Kitchen atorvastatin (LIPITOR) 20 MG tablet Take 20 mg by mouth daily.    . carvedilol (COREG) 25 MG tablet Take 1 tablet (25 mg total) by mouth 2 (two) times daily. 180 tablet 3  . colchicine 0.6 MG tablet Take 2 tabs by mouth once. If not better take 1 tab in 1 hour. Do not repeat for 3 days.    . diclofenac sodium (VOLTAREN) 1 % GEL Apply topically as needed.     . empagliflozin (JARDIANCE) 25 MG TABS tablet Take 25 mg by mouth daily.    Marland Kitchen EPINEPHrine (EPI-PEN) 0.3 mg/0.3 mL SOAJ injection Inject 0.3 mg into the muscle once.    . furosemide (LASIX) 40 MG tablet TAKE ONE AND ONE-HALF TABLETS TWICE A DAY (Patient taking differently: take 40 mg daily, may take an additional dose as needed) 270 tablet 0  . gabapentin (NEURONTIN) 300 MG capsule  Take 300 mg by mouth 3 (three) times daily.     Marland Kitchen glipiZIDE (GLUCOTROL) 5 MG tablet Take by mouth 2 (two) times daily before a meal.    . KRILL OIL PO Take by mouth daily.    Marland Kitchen liraglutide 18 MG/3ML SOPN Inject 0.1 ml subcutaneous once daily x 7 days then increase to 0.8ml once daily    . lisinopril (PRINIVIL,ZESTRIL) 20 MG tablet TAKE 1 TABLET DAILY (NEED APPOINTMENT FOR FURTHER REFILLS) 90 tablet 0  . metFORMIN (GLUCOPHAGE) 1000 MG tablet Take 1 tablet by mouth 2 (two) times daily.    Marland Kitchen spironolactone (ALDACTONE) 25 MG tablet Take 0.5 tablets (12.5 mg total) by mouth 2 (two) times daily. 30 tablet 5   No current facility-administered medications for this visit.      Past Surgical History:  Procedure Laterality Date  . BACK SURGERY     Lumbar fusion  . COLONOSCOPY  06/02/2012   Procedure: COLONOSCOPY;  Surgeon: Danie Binder, MD;  [facility];  Service: Endoscopy;  Laterality: N/A;  8:30 AM  . LEFT AND RIGHT HEART CATHETERIZATION WITH CORONARY ANGIOGRAM N/A 10/24/2013   Procedure: LEFT AND RIGHT HEART CATHETERIZATION WITH CORONARY ANGIOGRAM;  Surgeon: Jettie Booze, MD;  Location: [facility];  Service: Cardiovascular;  Laterality: N/A;  . TUBAL LIGATION       Allergies  Allergen Reactions  . Bee Venom Anaphylaxis and Hives      Family History  Problem Relation Age of Onset  . Prostate cancer Father   . Depression Mother   . Schizophrenia Sister   . Depression Sister   . Colon cancer Neg Hx   . Breast cancer Neg Hx      Social History Ms. Urbanik reports that she has never smoked. She has never used smokeless tobacco. Ms. Broshears reports current alcohol use.   Review of Systems CONSTITUTIONAL: No weight loss, fever, chills, weakness or fatigue.  HEENT: Eyes: No visual loss, blurred vision, double vision or yellow sclerae.No hearing loss, sneezing, congestion, runny nose or sore throat.  SKIN: No rash or itching.  CARDIOVASCULAR: per hpi RESPIRATORY:  No shortness of breath, cough or sputum.  GASTROINTESTINAL: No anorexia, nausea, vomiting or diarrhea. No abdominal pain or blood.  GENITOURINARY: No burning on urination, no polyuria NEUROLOGICAL: No headache, dizziness, syncope, paralysis, ataxia, numbness or tingling in the extremities. No change in bowel or bladder control.  MUSCULOSKELETAL: No muscle, back pain, joint pain or stiffness.  LYMPHATICS: No enlarged nodes. No history of splenectomy.  PSYCHIATRIC: No history of depression or anxiety.  ENDOCRINOLOGIC: No reports of sweating, cold or heat intolerance. No polyuria or polydipsia.  Marland Kitchen   Physical Examination Today's Vitals   06/20/19 1306  BP: 121/76  Pulse: 92  Temp: (!) 96.2 F (35.7 C)  TempSrc: Temporal  SpO2: 96%  Weight: 254 lb (115.2 kg)  Height: 5\' 4"  (1.626 m)   Body mass index is 43.6 kg/m.  Gen: resting comfortably, no acute distress HEENT: no scleral icterus, pupils equal round and reactive, no palptable cervical adenopathy,  CV: RRR, no mrg, no jvd Resp: Clear to auscultation bilaterally GI: abdomen is soft, non-tender, non-distended, normal bowel sounds, no hepatosplenomegaly MSK: extremities are warm, no edema.  Skin: warm, no rash Neuro:  no focal deficits Psych: appropriate affect   Diagnostic Studies 10/2013 Echo Study Conclusions  - Left ventricle: The cavity size was normal. Wall thickness was increased in a pattern of mild LVH. Systolic function was severely reduced. The estimated ejection fraction was in the range of 25% to 30%. Possible severe hypokinesis of the anteroseptal myocardium. Doppler parameters are consistent with abnormal left ventricular relaxation (grade 1 diastolic dysfunction). - Aortic valve: Poorly visualized. Mildly calcified annulus. Probably trileaflet. No significant regurgitation. - Right ventricle: Systolic function was reduced. - Right atrium: Central venous pressure: 58mm Hg (est). - Tricuspid valve:  Physiologic regurgitation. - Pulmonary arteries: Systolic pressure could not be accurately estimated. - Pericardium, extracardiac: A prominent pericardial fat pad was present. Impressions:  - Extremely limited study. There is mild LVH, overall normal LV chamber size, LVEF roughly estimated at 25-30% based on available images. There appears to be significant anteroseptal hypokinesis based on some views. Optison contrast may provide better images. There is grade 1 diastolic dysfunction. RV contraction is abnormal as well. No significant degree of mitral or tricuspid regurgitation noted, cannotassess PASP. CVP appears normal. Epicardial fat pad noted. 10/2013 Cath Procedural Findings: Hemodynamics: AO: 172/87 mmHg  LV: 165/20 mmHg  LVEDP: 25 mmHg  Coronary angiography: Coronary dominance: right   Left Main: normal   Left Anterior Descending (LAD): Normal in size with no significant disease.   1st diagonal (D1): Normal in size with no significant disease.   2nd  diagonal (D2): Large in size with no significant disease.   3rd diagonal (D3): Normal in size with no significant disease.   Circumflex (LCx): Normal in size and nondominant the vessel is free of any significant disease.   1st obtuse marginal: Small in size with minor irregularities.   2nd obtuse marginal: Normal in size with no significant disease.   3rd obtuse marginal: Normal in size with no significant disease   Right Coronary Artery: normal in size and dominant the vessel is free of any significant disease.   Posterior descending artery: large in size with no significant disease   Posterior AV segment: normal in size with no significant disease.   Posterolateral branchs: 3 normal size branchs which are free of significant disease. Left ventriculography: Left ventricular systolic function is severely reduced , LVEF is estimated at 25-30 %, there is no significant mitral regurgitation   Final Conclusions:  1. Normal coronary arteries.  2. Severely reduced LV systolic function with an ejection fraction of 25-30% due to nonischemic cardiomyopathy.  3. Moderately elevated left ventricular end-diastolic pressure  Recommendations:  Continue diuresis. Treat medically for nonischemic cardiomyopathy. Blood pressure control is recommended. The patient should be screened for sleep apnea and treated as indicated.    02/2014 Echo Study Conclusions  - Procedure narrative: Transthoracic echocardiography. Image quality was adequate. The study was technically difficult, as a result of poor sound wave transmission and body habitus. Intravenous contrast (Optison) was administered. - Left ventricle: The cavity size was normal. Wall thickness was normal. Systolic function was normal. The estimated ejection fraction was in the range of 60% to 65%. Wall motion was normal; there were no regional wall motion abnormalities. Left ventricular diastolic function parameters were normal. - Right ventricle: Not well visualized. Grossly appears normal in size and function. RV TAPSE is 2.3 cm.  12/2013 sleep apnea IMPRESSION:  1. Moderate obstructive sleep apnea syndrome worse during REM sleep. A formal sleep titration CPAP recording is suggested. 2. Abnormal sleep architecture with poor sleep efficiency, fragmented sleep and absence of slow wave sleep.  Thanks for this referral.  Assessment and Plan  1. NICM  - initially LVEF 25-30% by echo 10/2013, with medical therapy normalization of LVEF to 60-65%. -no recent symptoms, continue current meds  2. HTN  - at goal, continue current meds   3. OSA -has not been resolved for some time, we have placed a referal to pulmonary but does not appear she was able to follow up. She also temporarily moved to [place] and now moved back, does not appear able to resolved down there she reports due to [place] - she did have  moderate OSA by 2015 sleep study, need to help her get f/u to start cpap. Refer to Dr Luan Pulling, if not able to see prior to retirement refer to Dr Radford Pax  4. Hyperlipidemia -she will continue statin, upcoming labs with pcp   F/u 6 months   Arnoldo Lenis, M.D

## 2019-06-20 NOTE — Patient Instructions (Signed)
Medication Instructions:  Your physician recommends that you continue on your current medications as directed. Please refer to the Current Medication list given to you today.   Labwork: NONE  Testing/Procedures: NONE  Follow-Up: Your physician wants you to follow-up in: 6 MONTHS.  You will receive a reminder letter in the mail two months in advance. If you don't receive a letter, please call our office to schedule the follow-up appointment.   Any Other Special Instructions Will Be Listed Below (If Applicable).  You have been referred to DR. HAWKINS FOR SLEEP APNEA     If you need a refill on your cardiac medications before your next appointment, please call your pharmacy.

## 2019-07-13 ENCOUNTER — Other Ambulatory Visit: Payer: Self-pay | Admitting: Internal Medicine

## 2019-07-13 DIAGNOSIS — Z1231 Encounter for screening mammogram for malignant neoplasm of breast: Secondary | ICD-10-CM

## 2019-07-24 ENCOUNTER — Encounter: Payer: Self-pay | Admitting: Gastroenterology

## 2019-08-23 ENCOUNTER — Ambulatory Visit: Payer: BC Managed Care – PPO | Admitting: Gastroenterology

## 2019-09-03 ENCOUNTER — Telehealth: Payer: Self-pay | Admitting: Gastroenterology

## 2019-09-03 NOTE — Telephone Encounter (Signed)
Pt called to cancel her OV on 1/15 due to being afraid of coming out during a pandemic. She was confused and thought this day was for her colonoscopy.I told her it was an OV prior to scheduling colonoscopy and she was asking why does she need OV because she's never had to have one in the past. I offered her a virtual but she declined.

## 2019-09-04 NOTE — Telephone Encounter (Signed)
Ok to TO TRIAGE PT FOR COLONOSCOPY FOR MODERATE SEDATION. WILL NEED CPAP FOR TCS.

## 2019-09-04 NOTE — Telephone Encounter (Signed)
Dr. Fields, please advise! 

## 2019-09-05 ENCOUNTER — Encounter: Payer: Self-pay | Admitting: Gastroenterology

## 2019-09-05 NOTE — Telephone Encounter (Signed)
Please schedule nurse visit

## 2019-09-05 NOTE — Telephone Encounter (Signed)
Forwarding to [employer].

## 2019-09-05 NOTE — Telephone Encounter (Signed)
Mailed nurse visit letter and left note for her to call us when she was ready to schedule.

## 2019-09-07 ENCOUNTER — Ambulatory Visit: Payer: BC Managed Care – PPO | Admitting: Gastroenterology

## 2019-12-20 ENCOUNTER — Ambulatory Visit
Admission: RE | Admit: 2019-12-20 | Discharge: 2019-12-20 | Disposition: A | Discharge status: Home / Self Care | Payer: BC Managed Care – PPO | Source: Ambulatory Visit | Attending: Internal Medicine | Admitting: Internal Medicine

## 2019-12-20 DIAGNOSIS — Z1231 Encounter for screening mammogram for malignant neoplasm of breast: Secondary | ICD-10-CM | POA: Diagnosis not present

## 2020-06-24 ENCOUNTER — Ambulatory Visit (HOSPITAL_COMMUNITY): Payer: Self-pay | Admitting: Psychiatry

## 2020-07-25 ENCOUNTER — Ambulatory Visit (HOSPITAL_COMMUNITY): Payer: Medicare Other | Admitting: Psychiatry

## 2020-07-25 ENCOUNTER — Other Ambulatory Visit: Payer: Self-pay

## 2020-10-17 ENCOUNTER — Ambulatory Visit: Payer: Self-pay | Admitting: Cardiology

## 2020-12-02 ENCOUNTER — Encounter: Payer: Self-pay | Admitting: *Deleted

## 2020-12-03 ENCOUNTER — Other Ambulatory Visit: Payer: Self-pay

## 2020-12-03 ENCOUNTER — Ambulatory Visit: Payer: Managed Care, Other (non HMO) | Admitting: Cardiology

## 2020-12-03 ENCOUNTER — Encounter: Payer: Self-pay | Admitting: Cardiology

## 2020-12-03 VITALS — BP 136/80 | HR 96 | Ht 64.0 in | Wt 244.0 lb

## 2020-12-03 DIAGNOSIS — E782 Mixed hyperlipidemia: Secondary | ICD-10-CM | POA: Diagnosis not present

## 2020-12-03 DIAGNOSIS — I10 Essential (primary) hypertension: Secondary | ICD-10-CM

## 2020-12-03 DIAGNOSIS — I428 Other cardiomyopathies: Secondary | ICD-10-CM | POA: Diagnosis not present

## 2020-12-03 DIAGNOSIS — G4730 Sleep apnea, unspecified: Secondary | ICD-10-CM

## 2020-12-03 NOTE — Progress Notes (Signed)
Clinical Summary Ms. Lang is a 60 y.o.female seen today for follow up of the following medical problems.   1. NICM  - 10/2013 echo LVEF 98-92%, grade I diastolic dysfunction  - cath 10/2013 with patent coronaries - repeat echo 02/2014 after course of medical therapy showed normalization of LVEF at 60-65%.   - no recent SOB/DOE. No recent edema - compliant with meds   2. HTN  - home bp's usually 110s-130s/70s-80s  3. OSA -has not been able to establish with sleep medicine during covid   4. Hyperlipidemia - remains compliant with statin -07/2020 TG 196 HDL 30 LDL 58  - upcoming labs with pcp   SH: just moved back from [place], here [place] full time.    Past Medical History:  Diagnosis Date  . Chronic back pain   . Diabetes mellitus, type II ([place])   . Essential hypertension, benign   . Hypercholesteremia   . Neuromuscular disorder (HCC)    Numbness in right hand and left foot     Allergies  Allergen Reactions  . Bee Venom Anaphylaxis and Hives     Current Outpatient Medications  Medication Sig Dispense Refill  . aspirin 81 MG tablet Take 81 mg by mouth daily.    Marland Kitchen atorvastatin (LIPITOR) 20 MG tablet Take 20 mg by mouth daily.    . carvedilol (COREG) 25 MG tablet Take 1 tablet (25 mg total) by mouth 2 (two) times daily. 180 tablet 3  . colchicine 0.6 MG tablet Take 2 tabs by mouth once. If not better take 1 tab in 1 hour. Do not repeat for 3 days.    . diclofenac sodium (VOLTAREN) 1 % GEL Apply topically as needed.     . empagliflozin (JARDIANCE) 25 MG TABS tablet Take 25 mg by mouth daily.    Marland Kitchen EPINEPHrine (EPI-PEN) 0.3 mg/0.3 mL SOAJ injection Inject 0.3 mg into the muscle once.    . furosemide (LASIX) 40 MG tablet TAKE ONE AND ONE-HALF TABLETS TWICE A DAY (Patient taking differently: take 40 mg daily, may take an additional dose as needed) 270 tablet 0  . gabapentin (NEURONTIN) 300 MG capsule Take 300 mg by mouth 3 (three) times daily.     Marland Kitchen  glipiZIDE (GLUCOTROL) 5 MG tablet Take by mouth 2 (two) times daily before a meal.    . KRILL OIL PO Take by mouth daily.    Marland Kitchen liraglutide 18 MG/3ML SOPN Inject 0.1 ml subcutaneous once daily x 7 days then increase to 0.79ml once daily    . lisinopril (PRINIVIL,ZESTRIL) 20 MG tablet TAKE 1 TABLET DAILY (NEED APPOINTMENT FOR FURTHER REFILLS) 90 tablet 0  . metFORMIN (GLUCOPHAGE) 1000 MG tablet Take 1 tablet by mouth 2 (two) times daily.    Marland Kitchen spironolactone (ALDACTONE) 25 MG tablet Take 0.5 tablets (12.5 mg total) by mouth 2 (two) times daily. 30 tablet 5   No current facility-administered medications for this visit.     Past Surgical History:  Procedure Laterality Date  . BACK SURGERY     Lumbar fusion  . COLONOSCOPY  06/02/2012   Procedure: COLONOSCOPY;  Surgeon: Danie Binder, MD;  [facility];  Service: Endoscopy;  Laterality: N/A;  8:30 AM  . LEFT AND RIGHT HEART CATHETERIZATION WITH CORONARY ANGIOGRAM N/A 10/24/2013   Procedure: LEFT AND RIGHT HEART CATHETERIZATION WITH CORONARY ANGIOGRAM;  Surgeon: Jettie Booze, MD;  Location: [facility] CATH LAB;  Service: Cardiovascular;  Laterality: N/A;  . TUBAL LIGATION  Allergies  Allergen Reactions  . Bee Venom Anaphylaxis and Hives      Family History  Problem Relation Age of Onset  . Prostate cancer Father   . Depression Mother   . Schizophrenia Sister   . Depression Sister   . Colon cancer Neg Hx   . Breast cancer Neg Hx      Social History Ms. Rude reports that she has never smoked. She has never used smokeless tobacco. Ms. Firebaugh reports current alcohol use.   Review of Systems CONSTITUTIONAL: No weight loss, fever, chills, weakness or fatigue.  HEENT: Eyes: No visual loss, blurred vision, double vision or yellow sclerae.No hearing loss, sneezing, congestion, runny nose or sore throat.  SKIN: No rash or itching.  CARDIOVASCULAR: per hpi RESPIRATORY: No shortness of breath, cough or sputum.   GASTROINTESTINAL: No anorexia, nausea, vomiting or diarrhea. No abdominal pain or blood.  GENITOURINARY: No burning on urination, no polyuria NEUROLOGICAL: No headache, dizziness, syncope, paralysis, ataxia, numbness or tingling in the extremities. No change in bowel or bladder control.  MUSCULOSKELETAL: No muscle, back pain, joint pain or stiffness.  LYMPHATICS: No enlarged nodes. No history of splenectomy.  PSYCHIATRIC: No history of depression or anxiety.  ENDOCRINOLOGIC: No reports of sweating, cold or heat intolerance. No polyuria or polydipsia.  Marland Kitchen   Physical Examination Today's Vitals   12/03/20 1144  BP: 136/80  Pulse: 96  SpO2: 97%  Weight: 244 lb (110.7 kg)  Height: 5\' 4"  (1.626 m)   Body mass index is 41.88 kg/m.  Gen: resting comfortably, no acute distress HEENT: no scleral icterus, pupils equal round and reactive, no palptable cervical adenopathy,  CV: RRR, no mrg, no jvd Resp: Clear to auscultation bilaterally GI: abdomen is soft, non-tender, non-distended, normal bowel sounds, no hepatosplenomegaly MSK: extremities are warm, no edema.  Skin: warm, no rash Neuro:  no focal deficits Psych: appropriate affect   Diagnostic Studies  10/2013 Echo Study Conclusions  - Left ventricle: The cavity size was normal. Wall thickness was increased in a pattern of mild LVH. Systolic function was severely reduced. The estimated ejection fraction was in the range of 25% to 30%. Possible severe hypokinesis of the anteroseptal myocardium. Doppler parameters are consistent with abnormal left ventricular relaxation (grade 1 diastolic dysfunction). - Aortic valve: Poorly visualized. Mildly calcified annulus. Probably trileaflet. No significant regurgitation. - Right ventricle: Systolic function was reduced. - Right atrium: Central venous pressure: 102mm Hg (est). - Tricuspid valve: Physiologic regurgitation. - Pulmonary arteries: Systolic pressure could not be accurately  estimated. - Pericardium, extracardiac: A prominent pericardial fat pad was present. Impressions:  - Extremely limited study. There is mild LVH, overall normal LV chamber size, LVEF roughly estimated at 25-30% based on available images. There appears to be significant anteroseptal hypokinesis based on some views. Optison contrast may provide better images. There is grade 1 diastolic dysfunction. RV contraction is abnormal as well. No significant degree of mitral or tricuspid regurgitation noted, cannotassess PASP. CVP appears normal. Epicardial fat pad noted. 10/2013 Cath Procedural Findings: Hemodynamics: AO: 172/87 mmHg  LV: 165/20 mmHg  LVEDP: 25 mmHg  Coronary angiography: Coronary dominance: right   Left Main: normal   Left Anterior Descending (LAD): Normal in size with no significant disease.   1st diagonal (D1): Normal in size with no significant disease.   2nd diagonal (D2): Large in size with no significant disease.   3rd diagonal (D3): Normal in size with no significant disease.   Circumflex (LCx): Normal in size and  nondominant the vessel is free of any significant disease.   1st obtuse marginal: Small in size with minor irregularities.   2nd obtuse marginal: Normal in size with no significant disease.   3rd obtuse marginal: Normal in size with no significant disease   Right Coronary Artery: normal in size and dominant the vessel is free of any significant disease.   Posterior descending artery: large in size with no significant disease   Posterior AV segment: normal in size with no significant disease.   Posterolateral branchs: 3 normal size branchs which are free of significant disease. Left ventriculography: Left ventricular systolic function is severely reduced , LVEF is estimated at 25-30 %, there is no significant mitral regurgitation  Final Conclusions:  1. Normal coronary arteries.  2. Severely reduced LV systolic  function with an ejection fraction of 25-30% due to nonischemic cardiomyopathy.  3. Moderately elevated left ventricular end-diastolic pressure  Recommendations:  Continue diuresis. Treat medically for nonischemic cardiomyopathy. Blood pressure control is recommended. The patient should be screened for sleep apnea and treated as indicated.    02/2014 Echo Study Conclusions  - Procedure narrative: Transthoracic echocardiography. Image quality was adequate. The study was technically difficult, as a result of poor sound wave transmission and body habitus. Intravenous contrast (Optison) was administered. - Left ventricle: The cavity size was normal. Wall thickness was normal. Systolic function was normal. The estimated ejection fraction was in the range of 60% to 65%. Wall motion was normal; there were no regional wall motion abnormalities. Left ventricular diastolic function parameters were normal. - Right ventricle: Not well visualized. Grossly appears normal in size and function. RV TAPSE is 2.3 cm.  12/2013 sleep apnea IMPRESSION:  1. Moderate obstructive sleep apnea syndrome worse during REM sleep. A formal sleep titration CPAP recording is suggested. 2. Abnormal sleep architecture with poor sleep efficiency, fragmented sleep and absence of slow wave sleep.  Thanks for this referral.   Assessment and Plan  1. NICM  - initially LVEF 25-30% by echo 10/2013, with medical therapy normalization of LVEF to 60-65%. -denies any symptoms, continue current meds  2. HTN  -home bp's at goal, continue current meds  3. OSA -refer to pulmonary for evaluation. Prior testin 2015, for various reasons was not able to follow up to get cpap.   4. Hyperlipidemia -continue current meds, LDL has been at goal.       Arnoldo Lenis, M.D.

## 2020-12-03 NOTE — Patient Instructions (Signed)
Medication Instructions:  Continue all current medications.  Labwork: none  Testing/Procedures: none  Follow-Up: Your physician wants you to follow up in:  1 year.  You will receive a reminder letter in the mail one-two months in advance.  If you don't receive a letter, please call our office to schedule the follow up appointment.    Any Other Special Instructions Will Be Listed Below (If Applicable). You have been referred to:  Pulmonology   If you need a refill on your cardiac medications before your next appointment, please call your pharmacy.

## 2020-12-31 ENCOUNTER — Other Ambulatory Visit: Payer: Self-pay

## 2020-12-31 ENCOUNTER — Ambulatory Visit (INDEPENDENT_AMBULATORY_CARE_PROVIDER_SITE_OTHER): Payer: Managed Care, Other (non HMO) | Admitting: Pulmonary Disease

## 2020-12-31 ENCOUNTER — Encounter: Payer: Self-pay | Admitting: Pulmonary Disease

## 2020-12-31 DIAGNOSIS — G4733 Obstructive sleep apnea (adult) (pediatric): Secondary | ICD-10-CM | POA: Diagnosis not present

## 2020-12-31 NOTE — Progress Notes (Signed)
Subjective:    Patient ID: Bridget Little, female    DOB: 1960-12-17, 60 y.o.   MRN: [id number]  HPI  60 year old [place] woman referred for evaluation of obstructive sleep apnea. PMH -nonischemic cardiomyopathy EF was 25 to 30% in 2015 and has now recovered to 55 to 60%  I have reviewed last cardiology consultation from 4/20 return to.  She is maintained on medical therapy for chronic systolic heart failure. She had a sleep study done in 2015 that showed moderate OSA which we reviewed, but she moved to [place] for a few years and did not act on this. Epworth sleepiness score is 12 and she reports sleepiness while sitting and reading, watching TV or as a passenger in a car.  She has always been a night owl and bedtime can be as late as 1 or 2 AM.  She likes to watch the TV in the den till late at night.  When she gets into bed, sleep latency is minimal.  She sleeps on her side with 2 pillows, reports nocturnal awakenings every 2-3 hours for nocturia.  She drinks water whenever she wakes up.  She is finally out of bed by 9 AM feeling rested without dryness of mouth or headaches. She reports weight loss of almost 40 pounds over the last few years. There is no history suggestive of cataplexy, sleep paralysis or parasomnias    Significant tests/ events reviewed N PSG 01/2014 AHI 27/hour, lowest desaturation 75%   Past Medical History:  Diagnosis Date  . Chronic back pain   . Diabetes mellitus, type II ([place])   . Essential hypertension, benign   . Hypercholesteremia   . Neuromuscular disorder ([place])    Numbness in right hand and left foot   Past Surgical History:  Procedure Laterality Date  . BACK SURGERY     Lumbar fusion  . COLONOSCOPY  06/02/2012   Procedure: COLONOSCOPY;  Surgeon: Danie Binder, MD;  Location: [facility];  Service: Endoscopy;  Laterality: N/A;  8:30 AM  . LEFT AND RIGHT HEART CATHETERIZATION WITH CORONARY ANGIOGRAM N/A 10/24/2013   Procedure: LEFT AND RIGHT  HEART CATHETERIZATION WITH CORONARY ANGIOGRAM;  Surgeon: Jettie Booze, MD;  Location: [facility] CATH LAB;  Service: Cardiovascular;  Laterality: N/A;  . TUBAL LIGATION      Allergies  Allergen Reactions  . Bee Venom Anaphylaxis and Hives   Social History   Socioeconomic History  . Marital status: Married    Spouse name: Not on file  . Number of children: Not on file  . Years of education: Not on file  . Highest education level: Not on file  Occupational History  . Not on file  Tobacco Use  . Smoking status: Never Smoker  . Smokeless tobacco: Never Used  Vaping Use  . Vaping Use: Never used  Substance and Sexual Activity  . Alcohol use: Yes    Comment: very seldom  . Drug use: No  . Sexual activity: Yes    Birth control/protection: Surgical    Comment: tubal  Other Topics Concern  . Not on file  Social History Narrative  . Not on file   Social Determinants of Health   Financial Resource Strain: Not on file  Food Insecurity: Not on file  Transportation Needs: Not on file  Physical Activity: Not on file  Stress: Not on file  Social Connections: Not on file  Intimate Partner Violence: Not on file    Family History  Problem Relation Age  of Onset  . Prostate cancer Father   . Depression Mother   . Schizophrenia Sister   . Depression Sister   . Colon cancer Neg Hx   . Breast cancer Neg Hx       Review of Systems  Complains of dental problems Feet swelling Joint stiffness  Constitutional: negative for anorexia, fevers and sweats  Eyes: negative for irritation, redness and visual disturbance  Ears, nose, mouth, throat, and face: negative for earaches, epistaxis, nasal congestion and sore throat  Respiratory: negative for cough, dyspnea on exertion, sputum and wheezing  Cardiovascular: negative for chest pain, dyspnea,  orthopnea, palpitations and syncope  Gastrointestinal: negative for abdominal pain, constipation, diarrhea, melena, nausea and vomiting   Genitourinary:negative for dysuria, frequency and hematuria  Hematologic/lymphatic: negative for bleeding, easy bruising and lymphadenopathy  Musculoskeletal:negative for arthralgias, muscle weakness  Neurological: negative for coordination problems, gait problems, headaches and weakness  Endocrine: negative for diabetic symptoms including polydipsia, polyuria and weight loss     Objective:   Physical Exam  Gen. Pleasant, obese, in no distress, normal affect ENT - no pallor,icterus, no post nasal drip, class 2-3 airway Neck: No JVD, no thyromegaly, no carotid bruits Lungs: no use of accessory muscles, no dullness to percussion, decreased without rales or rhonchi  Cardiovascular: Rhythm regular, heart sounds  normal, no murmurs or gallops, 1+ peripheral edema Abdomen: soft and non-tender, no hepatosplenomegaly, BS normal. Musculoskeletal: No deformities, no cyanosis or clubbing Neuro:  alert, non focal, no tremors       Assessment & Plan:

## 2020-12-31 NOTE — Assessment & Plan Note (Signed)
We reviewed prior sleep study from 2015 which showed moderate OSA with AHI of 27/hour.  Desaturations were severe.  She is willing to use a CPAP machine now, her daughter has 1, she does report some claustrophobia and would prefer a smaller mask. We will undertake a home sleep test and based on this get her started with auto CPAP with nasal cradle mask.  The pathophysiology of obstructive sleep apnea , it's cardiovascular consequences & modes of treatment including CPAP were discused with the patient in detail & they evidenced understanding.

## 2020-12-31 NOTE — Patient Instructions (Signed)
Schedule HST

## 2020-12-31 NOTE — Assessment & Plan Note (Signed)
Weight loss encouraged in the correlation between weight and sleep apnea was discussed

## 2020-12-31 NOTE — Addendum Note (Signed)
Addended by: Merrilee Seashore on: 12/31/2020 11:47 AM   Modules accepted: Orders

## 2021-02-04 ENCOUNTER — Telehealth: Payer: Self-pay | Admitting: Cardiology

## 2021-02-04 MED ORDER — CARVEDILOL 25 MG PO TABS: 25.0000 mg | ORAL_TABLET | Freq: Two times a day (BID) | ORAL | 3 refills | Status: DC

## 2021-02-04 NOTE — Telephone Encounter (Signed)
Refilled as requested  

## 2021-02-04 NOTE — Telephone Encounter (Signed)
New message    Patient PCP is no longer going to fill this prescription, they want Dr Harl Bowie to follow it  *STAT* If patient is at the pharmacy, call can be transferred to refill team.   1. Which medications need to be refilled? (please list name of each medication and dose if known) carvedilol (COREG) 25 MG tablet  2. Which pharmacy/location (including street and city if local pharmacy) is medication to be sent to? [facility]   3. Do they need a 30 day or 90 day supply? [place]

## 2021-02-26 ENCOUNTER — Other Ambulatory Visit: Payer: Self-pay

## 2021-02-26 ENCOUNTER — Ambulatory Visit: Payer: Managed Care, Other (non HMO)

## 2021-02-26 DIAGNOSIS — G4733 Obstructive sleep apnea (adult) (pediatric): Secondary | ICD-10-CM | POA: Diagnosis not present

## 2021-03-04 ENCOUNTER — Telehealth: Payer: Self-pay | Admitting: Pulmonary Disease

## 2021-03-04 DIAGNOSIS — G4733 Obstructive sleep apnea (adult) (pediatric): Secondary | ICD-10-CM

## 2021-03-04 NOTE — Telephone Encounter (Signed)
Spoke with the pt and notified of results of sleep study. Pt verbalized understanding and was agreeable to CPAP therapy. I have placed DME referral for this and pt aware to contact the office for 31-90 day f/u once they begin using machine per insurance requirement.   

## 2021-03-04 NOTE — Telephone Encounter (Signed)
  HST showed mod  OSA with AHI 15/ hr Weight loss has helped and this study is improved compared to previous  Suggest autoCPAP  5-15 cm, nasal mask of choice OV with me/[facility] in 6 wks after starting

## 2021-03-12 ENCOUNTER — Encounter: Payer: Self-pay | Admitting: Internal Medicine

## 2021-05-01 ENCOUNTER — Telehealth: Payer: Self-pay | Admitting: Cardiology

## 2021-05-01 NOTE — Telephone Encounter (Signed)
New message     *STAT* If patient is at the pharmacy, call can be transferred to refill team.   1. Which medications need to be refilled? (please list name of each medication and dose if known) allopurinol (ZYLOPRIM) 100 MG tablet  2. Which pharmacy/location (including street and city if local pharmacy) is medication to be sent to? [place]  3. Do they need a 30 day or 90 day supply? [place]

## 2021-05-01 NOTE — Telephone Encounter (Signed)
Spoke with pt. Informed that we do not prescribe this medication. Pt thankful for the call and will reach out to her PCP.

## 2021-07-07 ENCOUNTER — Encounter: Payer: Self-pay | Admitting: Gastroenterology

## 2021-07-07 ENCOUNTER — Ambulatory Visit: Payer: Medicare Other | Admitting: Gastroenterology

## 2021-08-21 ENCOUNTER — Other Ambulatory Visit: Payer: Self-pay | Admitting: Internal Medicine

## 2021-08-21 DIAGNOSIS — Z1231 Encounter for screening mammogram for malignant neoplasm of breast: Secondary | ICD-10-CM

## 2021-08-28 ENCOUNTER — Encounter: Payer: Self-pay | Admitting: *Deleted

## 2021-11-18 ENCOUNTER — Ambulatory Visit (INDEPENDENT_AMBULATORY_CARE_PROVIDER_SITE_OTHER): Payer: Medicare Other | Admitting: Gastroenterology

## 2021-11-18 ENCOUNTER — Other Ambulatory Visit: Payer: Self-pay

## 2021-11-18 ENCOUNTER — Encounter: Payer: Self-pay | Admitting: Gastroenterology

## 2021-11-18 DIAGNOSIS — Z1211 Encounter for screening for malignant neoplasm of colon: Secondary | ICD-10-CM

## 2021-11-18 MED ORDER — PEG 3350-KCL-NA BICARB-NACL 420 G PO SOLR: 4000.0000 mL | ORAL | 0 refills | Status: DC

## 2021-11-18 NOTE — Patient Instructions (Signed)
We are arranging a colonoscopy in the near future. ? ?Please do not take metformin, glipizide, or jardiance the day of the procedure. ? ?Further recommendations to follow! ? ?It was a pleasure to see you today. I want to create trusting relationships with patients to provide genuine, compassionate, and quality care. I value your feedback. If you receive a survey regarding your visit,  I greatly appreciate you taking time to fill this out.  ? ?Annitta Needs, PhD, ANP-BC ?[facility]  ? ?

## 2021-11-18 NOTE — Progress Notes (Signed)
? ? ? ? ?Gastroenterology Office Note   ? ?Referring Provider: Elton Sin, DO ?Primary Care Physician:  Bridget Sin, DO  ?Primary GI: Dr. Abbey Little  ? ? ? ?Chief Complaint  ? ?Chief Complaint  ?Patient presents with  ? triage  ?  Triage for a colonoscopy  ? ? ? ?History of Present Illness  ? ?Bridget Little is a 61 y.o. female presenting today at the request of Bridget Sin, DO due to need for screening colonoscopy. Last colonoscopy in 2013 incomplete by Dr. Oneida Alar due to redundant colon.  ? ?No abdominal pain, N/V, changes in bowel habits, constipation, diarrhea, overt GI bleeding, GERD, dysphagia, unexplained weight loss, lack of appetite, unexplained weight gain.  ? ? ?Past Medical History:  ?Diagnosis Date  ? CHF (congestive heart failure) ([place])   ? Chronic back pain   ? Diabetes mellitus, type II ([place])   ? Essential hypertension, benign   ? Hypercholesteremia   ? Neuromuscular disorder ([place])   ? Numbness in right hand and left foot  ? ? ?Past Surgical History:  ?Procedure Laterality Date  ? BACK SURGERY    ? Lumbar fusion  ? COLONOSCOPY  06/02/2012  ? Incomplete colonoscopy due to redundant colon.  ? LEFT AND RIGHT HEART CATHETERIZATION WITH CORONARY ANGIOGRAM N/A 10/24/2013  ? Procedure: LEFT AND RIGHT HEART CATHETERIZATION WITH CORONARY ANGIOGRAM;  Surgeon: Jettie Booze, MD;  Location: [facility] CATH LAB;  Service: Cardiovascular;  Laterality: N/A;  ? TUBAL LIGATION    ? ? ?Current Outpatient Medications  ?Medication Sig Dispense Refill  ? allopurinol (ZYLOPRIM) 100 MG tablet Take 200 mg by mouth daily.    ? aspirin 81 MG tablet Take 81 mg by mouth daily.    ? atorvastatin (LIPITOR) 20 MG tablet Take 20 mg by mouth daily.    ? carvedilol (COREG) 25 MG tablet Take 1 tablet (25 mg total) by mouth 2 (two) times daily. 180 tablet 3  ? colchicine 0.6 MG tablet Take 2 tabs by mouth once. If not better take 1 tab in 1 hour. Do not repeat for 3 days.    ? empagliflozin (JARDIANCE) 25 MG TABS tablet Take  25 mg by mouth daily.    ? EPINEPHrine (EPI-PEN) 0.3 mg/0.3 mL SOAJ injection Inject 0.3 mg into the muscle once.    ? furosemide (LASIX) 40 MG tablet TAKE ONE AND ONE-HALF TABLETS TWICE A DAY 270 tablet 0  ? gabapentin (NEURONTIN) 300 MG capsule Take 300 mg by mouth 3 (three) times daily.     ? glipiZIDE (GLUCOTROL) 5 MG tablet Take by mouth 2 (two) times daily before a meal.    ? KRILL OIL PO Take by mouth daily.    ? lisinopril (ZESTRIL) 40 MG tablet Take 40 mg by mouth daily.    ? metFORMIN (GLUCOPHAGE) 1000 MG tablet Take 1 tablet by mouth 2 (two) times daily.    ? OZEMPIC, 0.25 OR 0.5 MG/DOSE, 2 MG/1.5ML SOPN Inject 0.5 mg into the skin once a week.    ? spironolactone (ALDACTONE) 25 MG tablet Take 0.5 tablets (12.5 mg total) by mouth 2 (two) times daily. 30 tablet 5  ? polyethylene glycol-electrolytes (TRILYTE) 420 g solution Take 4,000 mLs by mouth as directed. 4000 mL 0  ? ?No current facility-administered medications for this visit.  ? ? ?Allergies as of 11/18/2021 - Review Complete 12/31/2020  ?Allergen Reaction Noted  ? Bee venom Anaphylaxis and Hives 10/22/2013  ? ? ?Family History  ?Problem Relation Age  of Onset  ? Depression Mother   ? Prostate cancer Father   ? Schizophrenia Sister   ? Depression Sister   ? Colon cancer Neg Hx   ? Breast cancer Neg Hx   ? Colon polyps Neg Hx   ? ? ?Social History  ? ?Socioeconomic History  ? Marital status: Married  ?  Spouse name: Not on file  ? Number of children: Not on file  ? Years of education: Not on file  ? Highest education level: Not on file  ?Occupational History  ? Not on file  ?Tobacco Use  ? Smoking status: Never  ? Smokeless tobacco: Never  ?Vaping Use  ? Vaping Use: Never used  ?Substance and Sexual Activity  ? Alcohol use: Yes  ?  Comment: very seldom  ? Drug use: No  ? Sexual activity: Yes  ?  Birth control/protection: Surgical  ?  Comment: tubal  ?Other Topics Concern  ? Not on file  ?Social History Narrative  ? Not on file  ? ?Social Determinants  of Health  ? ?Financial Resource Strain: Not on file  ?Food Insecurity: Not on file  ?Transportation Needs: Not on file  ?Physical Activity: Not on file  ?Stress: Not on file  ?Social Connections: Not on file  ?Intimate Partner Violence: Not on file  ? ? ? ?Review of Systems  ? ?Gen: Denies any fever, chills, fatigue, weight loss, lack of appetite.  ?CV: Denies chest pain, heart palpitations, peripheral edema, syncope.  ?Resp: Denies shortness of breath at rest or with exertion. Denies wheezing or cough.  ?GI: see HPI ?GU : Denies urinary burning, urinary frequency, urinary hesitancy ?MS: Denies joint pain, muscle weakness, cramps, or limitation of movement.  ?Derm: Denies rash, itching, dry skin ?Psych: Denies depression, anxiety, memory loss, and confusion ?Heme: Denies bruising, bleeding, and enlarged lymph nodes. ? ? ?Physical Exam  ? ?BP 108/60   Pulse 88   Temp 98.2 ?F (36.8 ?C)   Ht '5\' 2"'$  (1.575 m)   Wt 229 lb 6.4 oz (104.1 kg)   LMP 05/19/2012   BMI 41.96 kg/m?  ?General:   Alert and oriented. Pleasant and cooperative. Well-nourished and well-developed.  ?Head:  Normocephalic and atraumatic. ?Eyes:  Without icterus ?Ears:  Normal auditory acuity. ?Lungs:  Clear to auscultation bilaterally.  ?Heart:  S1, S2 present without murmurs appreciated.  ?Abdomen:  +BS, soft, obese, large ventral hernia extending to umbilicus. non-tender  ?Rectal:  Deferred  ?Msk:  Symmetrical without gross deformities. Normal posture. ?Extremities:  Without edema. ?Neurologic:  Alert and  oriented x4;  grossly normal neurologically. ?Skin:  Intact without significant lesions or rashes. ?Psych:  Alert and cooperative. Normal mood and affect. ? ? ?Assessment  ? ?Bridget Little is a 61 y.o. female presenting today with need for screening colonoscopy, as last was in 2013 and incomplete due to redundant colon. ? ?She has no concerning lower or upper GI signs/symptoms. No family history of colorectal cancer or polyps.  ? ? ?PLAN   ? ?Proceed with colonoscopy by Dr. Abbey Little  in near future: the risks, benefits, and alternatives have been discussed with the patient in detail. The patient states understanding and desires to proceed.  ? ?Hold oral diabetes medication day of procedure ? ?Annitta Needs, PhD, ANP-BC ?[facility]  ? ? ?

## 2021-11-18 NOTE — H&P (View-Only) (Signed)
? ? ? ? ?Gastroenterology Office Note   ? ?Referring Provider: Elton Sin, DO ?Primary Care Physician:  Elton Sin, DO  ?Primary GI: Dr. Abbey Chatters  ? ? ? ?Chief Complaint  ? ?Chief Complaint  ?Patient presents with  ? triage  ?  Triage for a colonoscopy  ? ? ? ?History of Present Illness  ? ?Bridget Little is a 61 y.o. female presenting today at the request of Elton Sin, DO due to need for screening colonoscopy. Last colonoscopy in 2013 incomplete by Dr. Oneida Alar due to redundant colon.  ? ?No abdominal pain, N/V, changes in bowel habits, constipation, diarrhea, overt GI bleeding, GERD, dysphagia, unexplained weight loss, lack of appetite, unexplained weight gain.  ? ? ?Past Medical History:  ?Diagnosis Date  ? CHF (congestive heart failure) ([place])   ? Chronic back pain   ? Diabetes mellitus, type II ([place])   ? Essential hypertension, benign   ? Hypercholesteremia   ? Neuromuscular disorder ([place])   ? Numbness in right hand and left foot  ? ? ?Past Surgical History:  ?Procedure Laterality Date  ? BACK SURGERY    ? Lumbar fusion  ? COLONOSCOPY  06/02/2012  ? Incomplete colonoscopy due to redundant colon.  ? LEFT AND RIGHT HEART CATHETERIZATION WITH CORONARY ANGIOGRAM N/A 10/24/2013  ? Procedure: LEFT AND RIGHT HEART CATHETERIZATION WITH CORONARY ANGIOGRAM;  Surgeon: Bridget Booze, MD;  Location: [facility] CATH LAB;  Service: Cardiovascular;  Laterality: N/A;  ? TUBAL LIGATION    ? ? ?Current Outpatient Medications  ?Medication Sig Dispense Refill  ? allopurinol (ZYLOPRIM) 100 MG tablet Take 200 mg by mouth daily.    ? aspirin 81 MG tablet Take 81 mg by mouth daily.    ? atorvastatin (LIPITOR) 20 MG tablet Take 20 mg by mouth daily.    ? carvedilol (COREG) 25 MG tablet Take 1 tablet (25 mg total) by mouth 2 (two) times daily. 180 tablet 3  ? colchicine 0.6 MG tablet Take 2 tabs by mouth once. If not better take 1 tab in 1 hour. Do not repeat for 3 days.    ? empagliflozin (JARDIANCE) 25 MG TABS tablet Take  25 mg by mouth daily.    ? EPINEPHrine (EPI-PEN) 0.3 mg/0.3 mL SOAJ injection Inject 0.3 mg into the muscle once.    ? furosemide (LASIX) 40 MG tablet TAKE ONE AND ONE-HALF TABLETS TWICE A DAY 270 tablet 0  ? gabapentin (NEURONTIN) 300 MG capsule Take 300 mg by mouth 3 (three) times daily.     ? glipiZIDE (GLUCOTROL) 5 MG tablet Take by mouth 2 (two) times daily before a meal.    ? KRILL OIL PO Take by mouth daily.    ? lisinopril (ZESTRIL) 40 MG tablet Take 40 mg by mouth daily.    ? metFORMIN (GLUCOPHAGE) 1000 MG tablet Take 1 tablet by mouth 2 (two) times daily.    ? OZEMPIC, 0.25 OR 0.5 MG/DOSE, 2 MG/1.5ML SOPN Inject 0.5 mg into the skin once a week.    ? spironolactone (ALDACTONE) 25 MG tablet Take 0.5 tablets (12.5 mg total) by mouth 2 (two) times daily. 30 tablet 5  ? polyethylene glycol-electrolytes (TRILYTE) 420 g solution Take 4,000 mLs by mouth as directed. 4000 mL 0  ? ?No current facility-administered medications for this visit.  ? ? ?Allergies as of 11/18/2021 - Review Complete 12/31/2020  ?Allergen Reaction Noted  ? Bee venom Anaphylaxis and Hives 10/22/2013  ? ? ?Family History  ?Problem Relation Age  of Onset  ? Depression Mother   ? Prostate cancer Father   ? Schizophrenia Sister   ? Depression Sister   ? Colon cancer Neg Hx   ? Breast cancer Neg Hx   ? Colon polyps Neg Hx   ? ? ?Social History  ? ?Socioeconomic History  ? Marital status: Married  ?  Spouse name: Not on file  ? Number of children: Not on file  ? Years of education: Not on file  ? Highest education level: Not on file  ?Occupational History  ? Not on file  ?Tobacco Use  ? Smoking status: Never  ? Smokeless tobacco: Never  ?Vaping Use  ? Vaping Use: Never used  ?Substance and Sexual Activity  ? Alcohol use: Yes  ?  Comment: very seldom  ? Drug use: No  ? Sexual activity: Yes  ?  Birth control/protection: Surgical  ?  Comment: tubal  ?Other Topics Concern  ? Not on file  ?Social History Narrative  ? Not on file  ? ?Social Determinants  of Health  ? ?Financial Resource Strain: Not on file  ?Food Insecurity: Not on file  ?Transportation Needs: Not on file  ?Physical Activity: Not on file  ?Stress: Not on file  ?Social Connections: Not on file  ?Intimate Partner Violence: Not on file  ? ? ? ?Review of Systems  ? ?Gen: Denies any fever, chills, fatigue, weight loss, lack of appetite.  ?CV: Denies chest pain, heart palpitations, peripheral edema, syncope.  ?Resp: Denies shortness of breath at rest or with exertion. Denies wheezing or cough.  ?GI: see HPI ?GU : Denies urinary burning, urinary frequency, urinary hesitancy ?MS: Denies joint pain, muscle weakness, cramps, or limitation of movement.  ?Derm: Denies rash, itching, dry skin ?Psych: Denies depression, anxiety, memory loss, and confusion ?Heme: Denies bruising, bleeding, and enlarged lymph nodes. ? ? ?Physical Exam  ? ?BP 108/60   Pulse 88   Temp 98.2 ?F (36.8 ?C)   Ht '5\' 2"'$  (1.575 m)   Wt 229 lb 6.4 oz (104.1 kg)   LMP 05/19/2012   BMI 41.96 kg/m?  ?General:   Alert and oriented. Pleasant and cooperative. Well-nourished and well-developed.  ?Head:  Normocephalic and atraumatic. ?Eyes:  Without icterus ?Ears:  Normal auditory acuity. ?Lungs:  Clear to auscultation bilaterally.  ?Heart:  S1, S2 present without murmurs appreciated.  ?Abdomen:  +BS, soft, obese, large ventral hernia extending to umbilicus. non-tender  ?Rectal:  Deferred  ?Msk:  Symmetrical without gross deformities. Normal posture. ?Extremities:  Without edema. ?Neurologic:  Alert and  oriented x4;  grossly normal neurologically. ?Skin:  Intact without significant lesions or rashes. ?Psych:  Alert and cooperative. Normal mood and affect. ? ? ?Assessment  ? ?Bridget Little is a 60 y.o. female presenting today with need for screening colonoscopy, as last was in 2013 and incomplete due to redundant colon. ? ?She has no concerning lower or upper GI signs/symptoms. No family history of colorectal cancer or polyps.  ? ? ?PLAN   ? ?Proceed with colonoscopy by Dr. Abbey Chatters  in near future: the risks, benefits, and alternatives have been discussed with the patient in detail. The patient states understanding and desires to proceed.  ? ?Hold oral diabetes medication day of procedure ? ?Annitta Needs, PhD, ANP-BC ?[facility]  ? ? ?

## 2021-11-23 ENCOUNTER — Telehealth: Payer: Self-pay | Admitting: Internal Medicine

## 2021-11-23 NOTE — Telephone Encounter (Signed)
Pt LMOM that she has questions about her procedure with Dr Abbey Chatters. [phone] ?

## 2021-11-24 NOTE — Telephone Encounter (Signed)
Called pt, questions answered. °

## 2021-12-03 ENCOUNTER — Ambulatory Visit (INDEPENDENT_AMBULATORY_CARE_PROVIDER_SITE_OTHER): Payer: Managed Care, Other (non HMO) | Admitting: Cardiology

## 2021-12-03 ENCOUNTER — Encounter: Payer: Self-pay | Admitting: Cardiology

## 2021-12-03 VITALS — BP 118/70 | HR 86 | Ht 64.0 in | Wt 234.4 lb

## 2021-12-03 DIAGNOSIS — I10 Essential (primary) hypertension: Secondary | ICD-10-CM

## 2021-12-03 DIAGNOSIS — G4730 Sleep apnea, unspecified: Secondary | ICD-10-CM | POA: Diagnosis not present

## 2021-12-03 DIAGNOSIS — I428 Other cardiomyopathies: Secondary | ICD-10-CM | POA: Diagnosis not present

## 2021-12-03 DIAGNOSIS — E782 Mixed hyperlipidemia: Secondary | ICD-10-CM | POA: Diagnosis not present

## 2021-12-03 NOTE — Patient Instructions (Signed)

## 2021-12-03 NOTE — Progress Notes (Signed)
? ? ? ?Clinical Summary ?[name] is a 61 y.o.female seen today for follow up of the following medical problems.  ?  ?1. NICM   ?- 10/2013 echo LVEF 40-98%, grade I diastolic dysfunction   ?- cath 10/2013 with patent coronaries ?- repeat echo 02/2014 after course of medical therapy showed normalization of LVEF at 60-65%. ?  ?  ?- no SOB/DOE ?- chronic mild edema that is up and down.  ?- compliant with meds.  ?  ?  ?2. HTN   ?-home bp's 3 times a week, usually 110s-120s/60s-80s ?- compliant with meds ?- at pcp visit 122/80 ?  ?3. OSA ?-followed by Bridget Little ?- from 02/2021 phone note was to f/u with Bridget Little 6 weeks after starting new machine.  ? ?  ?  ?4. Hyperlipidemia ?- remains compliant with statin ?-07/2020 TG 196 HDL 30 LDL 58 ?  ?- 10/2021 TC 128 TG 182 HDL 43 LDL 49 ?  ?  ?Past Medical History:  ?Diagnosis Date  ? CHF (congestive heart failure) ([place])   ? Chronic back pain   ? Diabetes mellitus, type II ([place])   ? Essential hypertension, benign   ? Hypercholesteremia   ? Neuromuscular disorder ([place])   ? Numbness in right hand and left foot  ? ? ? ?Allergies  ?Allergen Reactions  ? Bee Venom Anaphylaxis and Hives  ? ? ? ?Current Outpatient Medications  ?Medication Sig Dispense Refill  ? allopurinol (ZYLOPRIM) 100 MG tablet Take 200 mg by mouth daily.    ? aspirin 81 MG tablet Take 81 mg by mouth daily.    ? atorvastatin (LIPITOR) 20 MG tablet Take 20 mg by mouth daily.    ? carvedilol (COREG) 25 MG tablet Take 1 tablet (25 mg total) by mouth 2 (two) times daily. 180 tablet 3  ? colchicine 0.6 MG tablet Take 2 tabs by mouth once. If not better take 1 tab in 1 hour. Do not repeat for 3 days.    ? empagliflozin (JARDIANCE) 25 MG TABS tablet Take 25 mg by mouth daily.    ? EPINEPHrine (EPI-PEN) 0.3 mg/0.3 mL SOAJ injection Inject 0.3 mg into the muscle once.    ? furosemide (LASIX) 40 MG tablet TAKE ONE AND ONE-HALF TABLETS TWICE A DAY (Patient taking differently: Take 40 mg by mouth daily.) 270 tablet 0  ? gabapentin  (NEURONTIN) 300 MG capsule Take 300 mg by mouth 3 (three) times daily.     ? glipiZIDE (GLUCOTROL) 5 MG tablet Take by mouth 2 (two) times daily before a meal.    ? KRILL OIL PO Take by mouth daily.    ? lisinopril (ZESTRIL) 40 MG tablet Take 40 mg by mouth daily.    ? metFORMIN (GLUCOPHAGE) 1000 MG tablet Take 1 tablet by mouth 2 (two) times daily.    ? OZEMPIC, 0.25 OR 0.5 MG/DOSE, 2 MG/1.5ML SOPN Inject 0.5 mg into the skin once a week.    ? polyethylene glycol-electrolytes (TRILYTE) 420 g solution Take 4,000 mLs by mouth as directed. 4000 mL 0  ? spironolactone (ALDACTONE) 25 MG tablet Take 0.5 tablets (12.5 mg total) by mouth 2 (two) times daily. 30 tablet 5  ? ?No current facility-administered medications for this visit.  ? ? ? ?Past Surgical History:  ?Procedure Laterality Date  ? BACK SURGERY    ? Lumbar fusion  ? COLONOSCOPY  06/02/2012  ? Incomplete colonoscopy due to redundant colon.  ? LEFT AND RIGHT HEART CATHETERIZATION WITH CORONARY  ANGIOGRAM N/A 10/24/2013  ? Procedure: LEFT AND RIGHT HEART CATHETERIZATION WITH CORONARY ANGIOGRAM;  Surgeon: Jettie Booze, MD;  Location: [facility] CATH LAB;  Service: Cardiovascular;  Laterality: N/A;  ? TUBAL LIGATION    ? ? ? ?Allergies  ?Allergen Reactions  ? Bee Venom Anaphylaxis and Hives  ? ? ? ? ?Family History  ?Problem Relation Age of Onset  ? Depression Mother   ? Prostate cancer Father   ? Schizophrenia Sister   ? Depression Sister   ? Colon cancer Neg Hx   ? Breast cancer Neg Hx   ? Colon polyps Neg Hx   ? ? ? ?Social History ?Bridget Little reports that she has never smoked. She has never used smokeless tobacco. ?Bridget Little reports current alcohol use. ? ? ?Review of Systems ?CONSTITUTIONAL: No weight loss, fever, chills, weakness or fatigue.  ?HEENT: Eyes: No visual loss, blurred vision, double vision or yellow sclerae.No hearing loss, sneezing, congestion, runny nose or sore throat.  ?SKIN: No rash or itching.  ?CARDIOVASCULAR: per hpi ?RESPIRATORY: No  shortness of breath, cough or sputum.  ?GASTROINTESTINAL: No anorexia, nausea, vomiting or diarrhea. No abdominal pain or blood.  ?GENITOURINARY: No burning on urination, no polyuria ?NEUROLOGICAL: No headache, dizziness, syncope, paralysis, ataxia, numbness or tingling in the extremities. No change in bowel or bladder control.  ?MUSCULOSKELETAL: No muscle, back pain, joint pain or stiffness.  ?LYMPHATICS: No enlarged nodes. No history of splenectomy.  ?PSYCHIATRIC: No history of depression or anxiety.  ?ENDOCRINOLOGIC: No reports of sweating, cold or heat intolerance. No polyuria or polydipsia.  ?. ? ? ?Physical Examination ?Today's Vitals  ? 12/03/21 1125 12/03/21 1149  ?BP: 140/80 118/70  ?Pulse: 86   ?SpO2: 95%   ?Weight: 234 lb 6.4 oz (106.3 kg)   ?Height: '5\' 4"'$  (1.626 m)   ? ?Body mass index is 40.23 kg/m?. ? ?Gen: resting comfortably, no acute distress ?HEENT: no scleral icterus, pupils equal round and reactive, no palptable cervical adenopathy,  ?CV: RRR, no m/r/g no jvd ?Resp: Clear to auscultation bilaterally ?GI: abdomen is soft, non-tender, non-distended, normal bowel sounds, no hepatosplenomegaly ?MSK: extremities are warm, no edema.  ?Skin: warm, no rash ?Neuro:  no focal deficits ?Psych: appropriate affect ? ? ?Diagnostic Studies ? ?10/2013 Echo   ?Study Conclusions ? ?- Left ventricle: The cavity size was normal. Wall thickness ?was increased in a pattern of mild LVH. Systolic function ?was severely reduced. The estimated ejection fraction was ?in the range of 25% to 30%. Possible severe hypokinesis of ?the anteroseptal myocardium. Doppler parameters are ?consistent with abnormal left ventricular relaxation ?(grade 1 diastolic dysfunction). ?- Aortic valve: Poorly visualized. Mildly calcified annulus. ?Probably trileaflet. No significant regurgitation. ?- Right ventricle: Systolic function was reduced. ?- Right atrium: Central venous pressure: 62m Hg (est). ?- Tricuspid valve: Physiologic  regurgitation. ?- Pulmonary arteries: Systolic pressure could not be ?accurately estimated. ?- Pericardium, extracardiac: A prominent pericardial fat pad ?was present. ?Impressions: ? ?- Extremely limited study. There is mild LVH, overall normal ?LV chamber size, LVEF roughly estimated at 25-30% based on ?available images. There appears to be significant ?anteroseptal hypokinesis based on some views. Optison ?contrast may provide better images. There is grade 1 ?diastolic dysfunction. RV contraction is abnormal as well. ?No significant degree of mitral or tricuspid regurgitation ?noted, cannotassess PASP. CVP appears normal. Epicardial ?fat pad noted.  ?10/2013 Cath   ?Procedural Findings:   ?Hemodynamics:   ?AO: 172/87 mmHg   ?LV: 165/20 mmHg   ?LVEDP: 25 mmHg   ?  Coronary angiography:   ?Coronary dominance: right   ?Left Main: normal   ?Left Anterior Descending (LAD): Normal in size with no significant disease.   ?1st diagonal (D1): Normal in size with no significant disease.   ?2nd diagonal (D2): Large in size with no significant disease.   ?3rd diagonal (D3): Normal in size with no significant disease.   ?Circumflex (LCx): Normal in size and nondominant the vessel is free of any significant disease.   ?1st obtuse marginal: Small in size with minor irregularities.   ?2nd obtuse marginal: Normal in size with no significant disease.   ?3rd obtuse marginal: Normal in size with no significant disease   ?Right Coronary Artery: normal in size and dominant the vessel is free of any significant disease.   ?Posterior descending artery: large in size with no significant disease   ?Posterior AV segment: normal in size with no significant disease.   ?Posterolateral branchs: 3 normal size branchs which are free of significant disease. ?Left ventriculography: Left ventricular systolic function is severely reduced , LVEF is estimated at 25-30 %, there is no significant mitral regurgitation   ?Final Conclusions:   ?1. Normal  coronary arteries.   ?2. Severely reduced LV systolic function with an ejection fraction of 25-30% due to nonischemic cardiomyopathy.   ?3. Moderately elevated left ventricular end-diastolic pressure   ?Recommendations:   ?C

## 2021-12-08 NOTE — Patient Instructions (Signed)
? ? ? ? ? ? Bridget Little ? 12/08/2021  ?  ? '@PREFPERIOPPHARMACY'$ @ ? ? Your procedure is scheduled on  12/14/2021. ? ? Report to [facility] at  1200  P.M. ? ? Call this number if you have problems the morning of surgery: ? [phone] ? ? Remember: ? Follow the diet and prep instructions given to you by the office. ? ?    DO NOT take any medications for diabetes the morning of your procedure. ?  ? Take these medicines the morning of surgery with A SIP OF WATER   ? ?                         allopurinol, carvedilol, gabapentin. ?  ? ? Do not wear jewelry, make-up or nail polish. ? Do not wear lotions, powders, or perfumes, or deodorant. ? Do not shave 48 hours prior to surgery.  Men may shave face and neck. ? Do not bring valuables to the hospital. ? [facility] is not responsible for any belongings or valuables. ? ?Contacts, dentures or bridgework may not be worn into surgery.  Leave your suitcase in the car.  After surgery it may be brought to your room. ? ?For patients admitted to the hospital, discharge time will be determined by your treatment team. ? ?Patients discharged the day of surgery will not be allowed to drive home and must have someone with them for 24 hours.  ? ? ?Special instructions:   DO NOT smoke tobacco or vape for 24 hours before your procedure. ? ?Please read over the following fact sheets that you were given. ?Anesthesia Post-op Instructions and Care and Recovery After Surgery ?  ? ? ? Colonoscopy, Adult, Care After ?The following information offers guidance on how to care for yourself after your procedure. Your health care provider may also give you more specific instructions. If you have problems or questions, contact your health care provider. ?What can I expect after the procedure? ?After the procedure, it is common to have: ?A small amount of blood in your stool for 24 hours after the procedure. ?Some gas. ?Mild cramping or bloating of your abdomen. ?Follow these instructions at  home: ?Eating and drinking ? ?Drink enough fluid to keep your urine pale yellow. ?Follow instructions from your health care provider about eating or drinking restrictions. ?Resume your normal diet as told by your health care provider. Avoid heavy or fried foods that are hard to digest. ?Activity ?Rest as told by your health care provider. ?Avoid sitting for a long time without moving. Get up to take short walks every 1-2 hours. This is important to improve blood flow and breathing. Ask for help if you feel weak or unsteady. ?Return to your normal activities as told by your health care provider. Ask your health care provider what activities are safe for you. ?Managing cramping and bloating ? ?Try walking around when you have cramps or feel bloated. ?If directed, apply heat to your abdomen as told by your health care provider. Use the heat source that your health care provider recommends, such as a moist heat pack or a heating pad. ?Place a towel between your skin and the heat source. ?Leave the heat on for 20-30 minutes. ?Remove the heat if your skin turns bright red. This is especially important if you are unable to feel pain, heat, or cold. You have a greater risk of getting burned. ?General instructions ?If you were given a  sedative during the procedure, it can affect you for several hours. Do not drive or operate machinery until your health care provider says that it is safe. ?For the first 24 hours after the procedure: ?Do not sign important documents. ?Do not drink alcohol. ?Do your regular daily activities at a slower pace than normal. ?Eat soft foods that are easy to digest. ?Take over-the-counter and prescription medicines only as told by your health care provider. ?Keep all follow-up visits. This is important. ?Contact a health care provider if: ?You have blood in your stool 2-3 days after the procedure. ?Get help right away if: ?You have more than a small spotting of blood in your stool. ?You have large  blood clots in your stool. ?You have swelling of your abdomen. ?You have nausea or vomiting. ?You have a fever. ?You have increasing pain in your abdomen that is not relieved with medicine. ?These symptoms may be an emergency. Get help right away. Call 911. ?Do not wait to see if the symptoms will go away. ?Do not drive yourself to the hospital. ?Summary ?After the procedure, it is common to have a small amount of blood in your stool. You may also have mild cramping and bloating of your abdomen. ?If you were given a sedative during the procedure, it can affect you for several hours. Do not drive or operate machinery until your health care provider says that it is safe. ?Get help right away if you have a lot of blood in your stool, nausea or vomiting, a fever, or increased pain in your abdomen. ?This information is not intended to replace advice given to you by your health care provider. Make sure you discuss any questions you have with your health care provider. ?Document Revised: 04/01/2021 Document Reviewed: 04/01/2021 ?Elsevier Patient Education ? [place]. ?Monitored Anesthesia Care, Care After ?This sheet gives you information about how to care for yourself after your procedure. Your health care provider may also give you more specific instructions. If you have problems or questions, contact your health care provider. ?What can I expect after the procedure? ?After the procedure, it is common to have: ?Tiredness. ?Forgetfulness about what happened after the procedure. ?Impaired judgment for important decisions. ?Nausea or vomiting. ?Some difficulty with balance. ?Follow these instructions at home: ?For the time period you were told by your health care provider: ? ?  ? ?Rest as needed. ?Do not participate in activities where you could fall or become injured. ?Do not drive or use machinery. ?Do not drink alcohol. ?Do not take sleeping pills or medicines that cause drowsiness. ?Do not make important  decisions or sign legal documents. ?Do not take care of children on your own. ?Eating and drinking ?Follow the diet that is recommended by your health care provider. ?Drink enough fluid to keep your urine pale yellow. ?If you vomit: ?Drink water, juice, or soup when you can drink without vomiting. ?Make sure you have little or no nausea before eating solid foods. ?General instructions ?Have a responsible adult stay with you for the time you are told. It is important to have someone help care for you until you are awake and alert. ?Take over-the-counter and prescription medicines only as told by your health care provider. ?If you have sleep apnea, surgery and certain medicines can increase your risk for breathing problems. Follow instructions from your health care provider about wearing your sleep device: ?Anytime you are sleeping, including during daytime naps. ?While taking prescription pain medicines, sleeping medicines, or medicines  that make you drowsy. ?Avoid smoking. ?Keep all follow-up visits as told by your health care provider. This is important. ?Contact a health care provider if: ?You keep feeling nauseous or you keep vomiting. ?You feel light-headed. ?You are still sleepy or having trouble with balance after 24 hours. ?You develop a rash. ?You have a fever. ?You have redness or swelling around the IV site. ?Get help right away if: ?You have trouble breathing. ?You have new-onset confusion at home. ?Summary ?For several hours after your procedure, you may feel tired. You may also be forgetful and have poor judgment. ?Have a responsible adult stay with you for the time you are told. It is important to have someone help care for you until you are awake and alert. ?Rest as told. Do not drive or operate machinery. Do not drink alcohol or take sleeping pills. ?Get help right away if you have trouble breathing, or if you suddenly become confused. ?This information is not intended to replace advice given to you  by your health care provider. Make sure you discuss any questions you have with your health care provider. ?Document Revised: 07/14/2021 Document Reviewed: 07/12/2019 ?Elsevier Patient Education ? 2023 Elsevier In

## 2021-12-09 ENCOUNTER — Encounter (HOSPITAL_COMMUNITY)
Admission: RE | Admit: 2021-12-09 | Discharge: 2021-12-09 | Disposition: A | Discharge status: Home / Self Care | Payer: Managed Care, Other (non HMO) | Source: Ambulatory Visit | Attending: Internal Medicine | Admitting: Internal Medicine

## 2021-12-09 ENCOUNTER — Encounter (HOSPITAL_COMMUNITY): Payer: Self-pay

## 2021-12-09 HISTORY — DX: Sleep apnea, unspecified: G47.30

## 2021-12-14 ENCOUNTER — Ambulatory Visit (HOSPITAL_BASED_OUTPATIENT_CLINIC_OR_DEPARTMENT_OTHER): Payer: Managed Care, Other (non HMO) | Admitting: Anesthesiology

## 2021-12-14 ENCOUNTER — Encounter (HOSPITAL_COMMUNITY): Payer: Self-pay

## 2021-12-14 ENCOUNTER — Encounter (HOSPITAL_COMMUNITY)
Admission: RE | Disposition: A | Discharge status: Home / Self Care | Payer: Self-pay | Source: Home / Self Care | Attending: Internal Medicine

## 2021-12-14 ENCOUNTER — Ambulatory Visit (HOSPITAL_COMMUNITY): Payer: Managed Care, Other (non HMO) | Admitting: Anesthesiology

## 2021-12-14 ENCOUNTER — Ambulatory Visit (HOSPITAL_COMMUNITY)
Admission: RE | Admit: 2021-12-14 | Discharge: 2021-12-14 | Disposition: A | Discharge status: Home / Self Care | Payer: Managed Care, Other (non HMO) | Attending: Internal Medicine | Admitting: Internal Medicine

## 2021-12-14 DIAGNOSIS — Z1211 Encounter for screening for malignant neoplasm of colon: Secondary | ICD-10-CM

## 2021-12-14 DIAGNOSIS — E119 Type 2 diabetes mellitus without complications: Secondary | ICD-10-CM

## 2021-12-14 DIAGNOSIS — I110 Hypertensive heart disease with heart failure: Secondary | ICD-10-CM

## 2021-12-14 DIAGNOSIS — K635 Polyp of colon: Secondary | ICD-10-CM

## 2021-12-14 DIAGNOSIS — I509 Heart failure, unspecified: Secondary | ICD-10-CM | POA: Insufficient documentation

## 2021-12-14 DIAGNOSIS — Z7984 Long term (current) use of oral hypoglycemic drugs: Secondary | ICD-10-CM | POA: Diagnosis not present

## 2021-12-14 DIAGNOSIS — K648 Other hemorrhoids: Secondary | ICD-10-CM | POA: Insufficient documentation

## 2021-12-14 DIAGNOSIS — G4733 Obstructive sleep apnea (adult) (pediatric): Secondary | ICD-10-CM

## 2021-12-14 DIAGNOSIS — D123 Benign neoplasm of transverse colon: Secondary | ICD-10-CM | POA: Insufficient documentation

## 2021-12-14 DIAGNOSIS — G4730 Sleep apnea, unspecified: Secondary | ICD-10-CM | POA: Diagnosis not present

## 2021-12-14 HISTORY — PX: POLYPECTOMY: SHX5525

## 2021-12-14 HISTORY — PX: COLONOSCOPY WITH PROPOFOL: SHX5780

## 2021-12-14 LAB — GLUCOSE, CAPILLARY: Glucose-Capillary: 97 mg/dL (ref 70–99)

## 2021-12-14 SURGERY — COLONOSCOPY WITH PROPOFOL
Anesthesia: General

## 2021-12-14 MED ORDER — LACTATED RINGERS IV SOLN: INTRAVENOUS | Status: DC

## 2021-12-14 MED ORDER — PROPOFOL 10 MG/ML IV BOLUS
INTRAVENOUS | Status: DC | PRN
  Administered 2021-12-14 (×2): 50 mg via INTRAVENOUS
  Administered 2021-12-14: 100 mg via INTRAVENOUS
  Administered 2021-12-14: 50 mg via INTRAVENOUS

## 2021-12-14 MED ORDER — LIDOCAINE HCL (CARDIAC) PF 100 MG/5ML IV SOSY
PREFILLED_SYRINGE | INTRAVENOUS | Status: DC | PRN
  Administered 2021-12-14: 50 mg via INTRAVENOUS

## 2021-12-14 NOTE — Transfer of Care (Signed)
Immediate Anesthesia Transfer of Care Note ? ?Patient: Bridget Little ? ?Procedure(s) Performed: COLONOSCOPY WITH PROPOFOL ?POLYPECTOMY ? ?Patient Location: Endoscopy Unit ? ?Anesthesia Type:General ? ?Level of Consciousness: drowsy ? ?Airway & Oxygen Therapy: Patient Spontanous Breathing ? ?Post-op Assessment: Report given to RN and Post -op Vital signs reviewed and stable ? ?Post vital signs: Reviewed and stable ? ?Last Vitals:  ?Vitals Value Taken Time  ?BP    ?Temp    ?Pulse    ?Resp    ?SpO2    ? ? ?Last Pain:  ?Vitals:  ? 12/14/21 1249  ?TempSrc:   ?PainSc: 0-No pain  ?   ? ?  ? ?Complications: No notable events documented. ?

## 2021-12-14 NOTE — Interval H&P Note (Signed)
History and Physical Interval Note: ? ?12/14/2021 ?12:42 PM ? ?Bridget Little  has presented today for surgery, with the diagnosis of screening colonoscopy.  The various methods of treatment have been discussed with the patient and family. After consideration of risks, benefits and other options for treatment, the patient has consented to  Procedure(s) with comments: ?COLONOSCOPY WITH PROPOFOL (N/A) - 2:00pm as a surgical intervention.  The patient's history has been reviewed, patient examined, no change in status, stable for surgery.  I have reviewed the patient's chart and labs.  Questions were answered to the patient's satisfaction.   ? ? ?Eloise Harman ? ? ?

## 2021-12-14 NOTE — Discharge Instructions (Addendum)
?  Colonoscopy ?Discharge Instructions ? ?Read the instructions outlined below and refer to this sheet in the next few weeks. These discharge instructions provide you with general information on caring for yourself after you leave the hospital. Your doctor may also give you specific instructions. While your treatment has been planned according to the most current medical practices available, unavoidable complications occasionally occur.  ? ?ACTIVITY ?You may resume your regular activity, but move at a slower pace for the next 24 hours.  ?Take frequent rest periods for the next 24 hours.  ?Walking will help get rid of the air and reduce the bloated feeling in your belly (abdomen).  ?No driving for 24 hours (because of the medicine (anesthesia) used during the test).   ?Do not sign any important legal documents or operate any machinery for 24 hours (because of the anesthesia used during the test).  ?NUTRITION ?Drink plenty of fluids.  ?You may resume your normal diet as instructed by your doctor.  ?Begin with a light meal and progress to your normal diet. Heavy or fried foods are harder to digest and may make you feel sick to your stomach (nauseated).  ?Avoid alcoholic beverages for 24 hours or as instructed.  ?MEDICATIONS ?You may resume your normal medications unless your doctor tells you otherwise.  ?WHAT YOU CAN EXPECT TODAY ?Some feelings of bloating in the abdomen.  ?Passage of more gas than usual.  ?Spotting of blood in your stool or on the toilet paper.  ?IF YOU HAD POLYPS REMOVED DURING THE COLONOSCOPY: ?No aspirin products for 7 days or as instructed.  ?No alcohol for 7 days or as instructed.  ?Eat a soft diet for the next 24 hours.  ?FINDING OUT THE RESULTS OF YOUR TEST ?Not all test results are available during your visit. If your test results are not back during the visit, make an appointment with your caregiver to find out the results. Do not assume everything is normal if you have not heard from your  caregiver or the medical facility. It is important for you to follow up on all of your test results.  ?SEEK IMMEDIATE MEDICAL ATTENTION IF: ?You have more than a spotting of blood in your stool.  ?Your belly is swollen (abdominal distention).  ?You are nauseated or vomiting.  ?You have a temperature over 101.  ?You have abdominal pain or discomfort that is severe or gets worse throughout the day.  ? ?Your colonoscopy revealed 2 polyp(s) which I removed successfully. Await pathology results, my office will contact you. I recommend repeating colonoscopy in 5 years for surveillance purposes. Otherwise follow up with Gi as needed.  ? ?Happy Belated Birthday! ? ?I hope you have a great rest of your week! ? ?Elon Alas. Abbey Chatters, D.O. ?Gastroenterology and Hepatology ?[facility] ? ?

## 2021-12-14 NOTE — Op Note (Signed)
[facility] ?Patient Name: Bridget Little ?Procedure Date: 12/14/2021 12:53 PM ?MRN: [id number] ?Date of Birth: 01/18/1961 ?Attending MD: Elon Alas. Abbey Chatters , DO ?CSN: [id number] ?Age: 61 ?Admit Type: Outpatient ?Procedure:                Colonoscopy ?Indications:              Screening for colorectal malignant neoplasm ?Providers:                Elon Alas. Abbey Chatters, DO, Tammy Vaught, RN, Nelma Rothman,  ?                          Technician ?Referring MD:              ?Medicines:                See the Anesthesia note for documentation of the  ?                          administered medications ?Complications:            No immediate complications. ?Estimated Blood Loss:     Estimated blood loss was minimal. ?Procedure:                Pre-Anesthesia Assessment: ?                          - The anesthesia plan was to use monitored  ?                          anesthesia care (MAC). ?                          After obtaining informed consent, the colonoscope  ?                          was passed under direct vision. Throughout the  ?                          procedure, the patient's blood pressure, pulse, and  ?                          oxygen saturations were monitored continuously. The  ?                          PCF-HQ190L ([id number]) scope was introduced through  ?                          the anus and advanced to the the cecum, identified  ?                          by appendiceal orifice and ileocecal valve. The  ?                          colonoscopy was performed without difficulty. The  ?                          patient tolerated the procedure well. The  quality  ?                          of the bowel preparation was evaluated using the  ?                          BBPS [facility] Bowel Preparation Scale) with scores  ?                          of: Right Colon = 2 (minor amount of residual  ?                          staining, small fragments of stool and/or opaque  ?                          liquid, but mucosa  seen well), Transverse Colon = 3  ?                          (entire mucosa seen well with no residual staining,  ?                          small fragments of stool or opaque liquid) and Left  ?                          Colon = 3 (entire mucosa seen well with no residual  ?                          staining, small fragments of stool or opaque  ?                          liquid). The total BBPS score equals 8. The quality  ?                          of the bowel preparation was good. ?Scope In: 12:53:52 PM ?Scope Out: 1:07:17 PM ?Scope Withdrawal Time: 0 hours 11 minutes 21 seconds  ?Total Procedure Duration: 0 hours 13 minutes 25 seconds  ?Findings: ?     The perianal and digital rectal examinations were normal. ?     Non-bleeding internal hemorrhoids were found during endoscopy. ?     Two sessile polyps were found in the transverse colon. The polyps were 4  ?     to 8 mm in size. These polyps were removed with a cold snare. Resection  ?     and retrieval were complete. ?     The exam was otherwise without abnormality. ?Impression:               - Non-bleeding internal hemorrhoids. ?                          - Two 4 to 8 mm polyps in the transverse colon,  ?                          removed with a cold snare. Resected and retrieved. ?                          -  The examination was otherwise normal. ?Moderate Sedation: ?     Per Anesthesia Care ?Recommendation:           - Patient has a contact number available for  ?                          emergencies. The signs and symptoms of potential  ?                          delayed complications were discussed with the  ?                          patient. Return to normal activities tomorrow.  ?                          Written discharge instructions were provided to the  ?                          patient. ?                          - Resume previous diet. ?                          - Continue present medications. ?                          - Await pathology results. ?                           - Repeat colonoscopy in 5 years for surveillance. ?                          - Return to GI clinic PRN. ?Procedure Code(s):        --- Professional --- ?                          [phone], Colonoscopy, flexible; with removal of  ?                          tumor(s), polyp(s), or other lesion(s) by snare  ?                          technique ?Diagnosis Code(s):        --- Professional --- ?                          Z12.11, Encounter for screening for malignant  ?                          neoplasm of colon ?                          K63.5, Polyp of colon ?                          K64.8, Other hemorrhoids ?CPT copyright 2019 American Medical Association. All rights  reserved. ?The codes documented in this report are preliminary and upon coder review may  ?be revised to meet current compliance requirements. ?Elon Alas. Abbey Chatters, DO ?Elon Alas. Abbey Chatters, DO ?12/14/2021 1:10:04 PM ?This report has been signed electronically. ?Number of Addenda: 0 ?

## 2021-12-14 NOTE — Anesthesia Preprocedure Evaluation (Signed)
Anesthesia Evaluation  ?Patient identified by MRN, date of birth, ID band ?Patient awake ? ? ? ?Reviewed: ?Allergy & Precautions, NPO status , Patient's Chart, lab work & pertinent test results, reviewed documented beta blocker date and time  ? ?Airway ?Mallampati: II ? ?TM Distance: >3 FB ?Neck ROM: Full ? ? ? Dental ? ?(+) Dental Advisory Given, Missing ?  ?Pulmonary ?sleep apnea ,  ?  ?Pulmonary exam normal ?breath sounds clear to auscultation ? ? ? ? ? ? Cardiovascular ?Exercise Tolerance: Poor ?hypertension, Pt. on medications and Pt. on home beta blockers ?+CHF  ?Normal cardiovascular exam ?Rhythm:Regular Rate:Normal ? ? ?  ?Neuro/Psych ? Neuromuscular disease negative psych ROS  ? GI/Hepatic ?negative GI ROS, Neg liver ROS,   ?Endo/Other  ?diabetes, Well Controlled, Type 2, Oral Hypoglycemic Agents ? Renal/GU ?negative Renal ROS  ?negative genitourinary ?  ?Musculoskeletal ?negative musculoskeletal ROS ?(+)  ? Abdominal ?  ?Peds ?negative pediatric ROS ?(+)  Hematology ?negative hematology ROS ?(+)   ?Anesthesia Other Findings ? ? Reproductive/Obstetrics ?negative OB ROS ? ?  ? ? ? ? ? ? ? ? ? ? ? ? ? ?  ?  ? ? ? ? ? ? ?Anesthesia Physical ?Anesthesia Plan ? ?ASA: 3 ? ?Anesthesia Plan: General  ? ?Post-op Pain Management: Minimal or no pain anticipated  ? ?Induction: Intravenous ? ?PONV Risk Score and Plan: Propofol infusion ? ?Airway Management Planned: Nasal Cannula and Natural Airway ? ?Additional Equipment:  ? ?Intra-op Plan:  ? ?Post-operative Plan:  ? ?Informed Consent: I have reviewed the patients History and Physical, chart, labs and discussed the procedure including the risks, benefits and alternatives for the proposed anesthesia with the patient or authorized representative who has indicated his/her understanding and acceptance.  ? ? ? ?Dental advisory given ? ?Plan Discussed with: CRNA and Surgeon ? ?Anesthesia Plan Comments:   ? ? ? ? ? ?Anesthesia Quick  Evaluation ? ?

## 2021-12-14 NOTE — Anesthesia Postprocedure Evaluation (Signed)
Anesthesia Post Note ? ?Patient: Bridget Little ? ?Procedure(s) Performed: COLONOSCOPY WITH PROPOFOL ?POLYPECTOMY ? ?Patient location during evaluation: Phase II ?Anesthesia Type: General ?Level of consciousness: awake and alert and oriented ?Pain management: pain level controlled ?Vital Signs Assessment: post-procedure vital signs reviewed and stable ?Respiratory status: spontaneous breathing, nonlabored ventilation and respiratory function stable ?Cardiovascular status: blood pressure returned to baseline and stable ?Postop Assessment: no apparent nausea or vomiting ?Anesthetic complications: no ? ? ?No notable events documented. ? ? ?Last Vitals:  ?Vitals:  ? 12/14/21 1309 12/14/21 1315  ?BP: 114/67 112/72  ?Pulse: 88 90  ?Resp: 18 (!) 21  ?Temp: 36.6 ?C   ?SpO2: 95% 97%  ?  ?Last Pain:  ?Vitals:  ? 12/14/21 1315  ?TempSrc:   ?PainSc: 0-No pain  ? ? ?  ?  ?  ?  ?  ?  ? ?Averiana Clouatre C Dawt Reeb ? ? ? ? ?

## 2021-12-16 LAB — SURGICAL PATHOLOGY

## 2021-12-17 ENCOUNTER — Encounter (HOSPITAL_COMMUNITY): Payer: Self-pay | Admitting: Internal Medicine

## 2021-12-22 ENCOUNTER — Ambulatory Visit
Admission: RE | Admit: 2021-12-22 | Discharge: 2021-12-22 | Disposition: A | Discharge status: Home / Self Care | Payer: Managed Care, Other (non HMO) | Source: Ambulatory Visit | Attending: Internal Medicine | Admitting: Internal Medicine

## 2021-12-22 DIAGNOSIS — Z1231 Encounter for screening mammogram for malignant neoplasm of breast: Secondary | ICD-10-CM | POA: Diagnosis not present

## 2022-05-04 ENCOUNTER — Ambulatory Visit: Payer: Managed Care, Other (non HMO) | Admitting: Pulmonary Disease

## 2022-05-10 ENCOUNTER — Ambulatory Visit (INDEPENDENT_AMBULATORY_CARE_PROVIDER_SITE_OTHER): Payer: Managed Care, Other (non HMO) | Admitting: Pulmonary Disease

## 2022-05-10 ENCOUNTER — Encounter: Payer: Self-pay | Admitting: Pulmonary Disease

## 2022-05-10 DIAGNOSIS — G4733 Obstructive sleep apnea (adult) (pediatric): Secondary | ICD-10-CM | POA: Diagnosis not present

## 2022-05-10 NOTE — Progress Notes (Signed)
   Subjective:    Patient ID: Bridget Little, female    DOB: Dec 20, 1960, 61 y.o.   MRN: [id number]  HPI  61 yo woman for of obstructive sleep apnea. PMH -nonischemic cardiomyopathy EF was 25 to 30% in 2015 and has now recovered to 55 to 60%   Chief Complaint  Patient presents with   Follow-up    Using cpap every night     We set up CPAP in 02/2021 with a FF mask , tried the nasal mask first She has settled down with this and reports compliance and uses it at least 6 hours every night. She reports improvement in her daytime fatigue and has more energy. Husband feels that she rests well   Significant tests/ events reviewed  02/2021 HST  mod  OSA with AHI 15/ hr N PSG 01/2014 AHI 27/hour, lowest desaturation 75%  Review of Systems  neg for any significant sore throat, dysphagia, itching, sneezing, nasal congestion or excess/ purulent secretions, fever, chills, sweats, unintended wt loss, pleuritic or exertional cp, hempoptysis, orthopnea pnd or change in chronic leg swelling. Also denies presyncope, palpitations, heartburn, abdominal pain, nausea, vomiting, diarrhea or change in bowel or urinary habits, dysuria,hematuria, rash, arthralgias, visual complaints, headache, numbness weakness or ataxia.     Objective:   Physical Exam   Gen. Pleasant, obese, in no distress ENT - no lesions, no post nasal drip Neck: No JVD, no thyromegaly, no carotid bruits Lungs: no use of accessory muscles, no dullness to percussion, decreased without rales or rhonchi  Cardiovascular: Rhythm regular, heart sounds  normal, no murmurs or gallops, no peripheral edema Musculoskeletal: No deformities, no cyanosis or clubbing , no tremors        Assessment & Plan:

## 2022-05-10 NOTE — Assessment & Plan Note (Signed)
We reviewed home sleep test which showed AHI of 15/hour.  She is compliant with CPAP on subjective reporting and this is certainly helped improve her daytime somnolence and fatigue. We will try to obtain CPAP download. CPAP supplies will be renewed  Weight loss encouraged, compliance with goal of at least 4-6 hrs every night is the expectation. Advised against medications with sedative side effects Cautioned against driving when sleepy - understanding that sleepiness will vary on a day to day basis

## 2022-05-10 NOTE — Patient Instructions (Signed)
  X CHECK CPAP DOWNLOAD

## 2022-05-10 NOTE — Assessment & Plan Note (Signed)
Weight loss encouraged and a direct correlation between OSA and weight was discussed

## 2022-05-19 ENCOUNTER — Other Ambulatory Visit: Payer: Self-pay | Admitting: Orthopaedic Surgery

## 2022-05-19 ENCOUNTER — Other Ambulatory Visit (HOSPITAL_COMMUNITY): Payer: Self-pay | Admitting: Orthopaedic Surgery

## 2022-05-19 DIAGNOSIS — M4326 Fusion of spine, lumbar region: Secondary | ICD-10-CM

## 2022-06-09 ENCOUNTER — Ambulatory Visit: Payer: Managed Care, Other (non HMO) | Admitting: Pulmonary Disease

## 2022-06-10 ENCOUNTER — Ambulatory Visit (HOSPITAL_COMMUNITY)
Admission: RE | Admit: 2022-06-10 | Discharge: 2022-06-10 | Disposition: A | Discharge status: Home / Self Care | Payer: Managed Care, Other (non HMO) | Source: Ambulatory Visit | Attending: Orthopaedic Surgery | Admitting: Orthopaedic Surgery

## 2022-06-10 DIAGNOSIS — M4326 Fusion of spine, lumbar region: Secondary | ICD-10-CM | POA: Diagnosis present

## 2022-09-28 ENCOUNTER — Other Ambulatory Visit (HOSPITAL_COMMUNITY)
Admission: RE | Admit: 2022-09-28 | Discharge: 2022-09-28 | Disposition: A | Discharge status: Home / Self Care | Payer: Managed Care, Other (non HMO) | Source: Ambulatory Visit | Attending: Obstetrics and Gynecology | Admitting: Obstetrics and Gynecology

## 2022-09-28 ENCOUNTER — Encounter: Payer: Self-pay | Admitting: Obstetrics and Gynecology

## 2022-09-28 ENCOUNTER — Ambulatory Visit: Payer: Managed Care, Other (non HMO) | Admitting: Obstetrics and Gynecology

## 2022-09-28 VITALS — BP 118/79 | HR 105 | Ht 63.2 in | Wt 224.0 lb

## 2022-09-28 DIAGNOSIS — N841 Polyp of cervix uteri: Secondary | ICD-10-CM

## 2022-09-28 DIAGNOSIS — Z01419 Encounter for gynecological examination (general) (routine) without abnormal findings: Secondary | ICD-10-CM | POA: Insufficient documentation

## 2022-09-28 DIAGNOSIS — Z01411 Encounter for gynecological examination (general) (routine) with abnormal findings: Secondary | ICD-10-CM | POA: Insufficient documentation

## 2022-09-28 DIAGNOSIS — Z1151 Encounter for screening for human papillomavirus (HPV): Secondary | ICD-10-CM | POA: Insufficient documentation

## 2022-09-28 DIAGNOSIS — D259 Leiomyoma of uterus, unspecified: Secondary | ICD-10-CM | POA: Diagnosis not present

## 2022-09-28 NOTE — Progress Notes (Signed)
Bridget Little is a 62 y.o. G46P2002 female here for a routine annual gynecologic exam.  Current complaints: Occ vaginal spotting. H/O PMB, W/U with U/S and EMBX negative   Denies abnormal vaginal bleeding, discharge, pelvic pain, problems with intercourse or other gynecologic concerns.    Gynecologic History Patient's last menstrual period was 05/19/2012. Contraception: post menopausal status Last Pap: 2020. Results were: normal Last mammogram: 6/23. Results were: normal  Obstetric History OB History  Gravida Para Term Preterm AB Living  '2 2 2     2  '$ SAB IAB Ectopic Multiple Live Births               # Outcome Date GA Lbr Len/2nd Weight Sex Delivery Anes PTL Lv  2 Term           1 Term             Past Medical History:  Diagnosis Date   CHF (congestive heart failure) (HCC)    Chronic back pain    Diabetes mellitus, type II ([place])    Essential hypertension, benign    Hypercholesteremia    Neuromuscular disorder ([place])    right foot   Sleep apnea     Past Surgical History:  Procedure Laterality Date   BACK SURGERY     Lumbar fusion   COLONOSCOPY  06/02/2012   Incomplete colonoscopy due to redundant colon.   COLONOSCOPY WITH PROPOFOL N/A 12/14/2021   Procedure: COLONOSCOPY WITH PROPOFOL;  Surgeon: Eloise Harman, DO;  Location: [facility];  Service: Endoscopy;  Laterality: N/A;  2:00pm   LEFT AND RIGHT HEART CATHETERIZATION WITH CORONARY ANGIOGRAM N/A 10/24/2013   Procedure: LEFT AND RIGHT HEART CATHETERIZATION WITH CORONARY ANGIOGRAM;  Surgeon: Jettie Booze, MD;  Location: [facility] CATH LAB;  Service: Cardiovascular;  Laterality: N/A;   POLYPECTOMY  12/14/2021   Procedure: POLYPECTOMY;  Surgeon: Eloise Harman, DO;  Location: [facility];  Service: Endoscopy;;   TUBAL LIGATION      Current Outpatient Medications on File Prior to Visit  Medication Sig Dispense Refill   allopurinol (ZYLOPRIM) 100 MG tablet Take 100 mg by mouth 2 (two) times daily.      aspirin 81 MG tablet Take 81 mg by mouth daily.     atorvastatin (LIPITOR) 40 MG tablet Take 40 mg by mouth daily.     Calcium Citrate-Vitamin D (CALCIUM + D PO) Take 1 tablet by mouth daily.     carvedilol (COREG) 25 MG tablet Take 1 tablet (25 mg total) by mouth 2 (two) times daily. 180 tablet 3   colchicine 0.6 MG tablet Take 0.6-1.2 mg by mouth See admin instructions. Take 1.2 mg as needed at onset of gout flare may take an additional 0.6 mg an hour later as needed. Do not repeat for 3 days     empagliflozin (JARDIANCE) 25 MG TABS tablet Take 25 mg by mouth daily.     EPINEPHrine (EPI-PEN) 0.3 mg/0.3 mL SOAJ injection Inject 0.3 mg into the muscle as needed for anaphylaxis.     furosemide (LASIX) 40 MG tablet TAKE ONE AND ONE-HALF TABLETS TWICE A DAY (Patient taking differently: Take 40 mg by mouth daily.) 270 tablet 0   gabapentin (NEURONTIN) 300 MG capsule Take 300 mg by mouth 3 (three) times daily.      glipiZIDE (GLUCOTROL) 10 MG tablet Take 10 mg by mouth 2 (two) times daily.     Krill Oil 1000 MG CAPS Take 1,000 mg by mouth  daily.     lisinopril (ZESTRIL) 40 MG tablet Take 40 mg by mouth daily.     metFORMIN (GLUCOPHAGE) 1000 MG tablet Take 1 tablet by mouth 2 (two) times daily with a meal.     Semaglutide, 2 MG/DOSE, (OZEMPIC, 2 MG/DOSE,) 8 MG/3ML SOPN Inject 2 mg into the skin every Sunday.     silver sulfADIAZINE (SILVADENE) 1 % cream Apply 1 application. topically daily as needed (diabetic related sores).     spironolactone (ALDACTONE) 25 MG tablet Take 0.5 tablets (12.5 mg total) by mouth 2 (two) times daily. 30 tablet 5   No current facility-administered medications on file prior to visit.    Allergies  Allergen Reactions   Bee Venom Anaphylaxis and Hives    Social History   Socioeconomic History   Marital status: Married    Spouse name: Not on file   Number of children: Not on file   Years of education: Not on file   Highest education level: Not on file  Occupational  History   Not on file  Tobacco Use   Smoking status: Never   Smokeless tobacco: Never  Vaping Use   Vaping Use: Never used  Substance and Sexual Activity   Alcohol use: Yes    Comment: very seldom   Drug use: No   Sexual activity: Yes    Birth control/protection: Surgical    Comment: tubal  Other Topics Concern   Not on file  Social History Narrative   Not on file   Social Determinants of Health   Financial Resource Strain: Low Risk  (09/28/2022)   Overall Financial Resource Strain (CARDIA)    Difficulty of Paying Living Expenses: Not hard at all  Food Insecurity: No Food Insecurity (09/28/2022)   Hunger Vital Sign    Worried About Running Out of Food in the Last Year: Never true    [place] in the Last Year: Never true  Transportation Needs: No Transportation Needs (09/28/2022)   PRAPARE - Hydrologist (Medical): No    Lack of Transportation (Non-Medical): No  Physical Activity: Insufficiently Active (09/28/2022)   Exercise Vital Sign    Days of Exercise per Week: 2 days    Minutes of Exercise per Session: 10 min  Stress: No Stress Concern Present (09/28/2022)   [place]    Feeling of Stress : Not at all  Social Connections: [place] (09/28/2022)   Social Connection and Isolation Panel [NHANES]    Frequency of Communication with Friends and Family: More than three times a week    Frequency of Social Gatherings with Friends and Family: Once a week    Attends Religious Services: More than 4 times per year    Active Member of [employer] or Organizations: Yes    Attends Music therapist: More than 4 times per year    Marital Status: Married  Human resources officer Violence: Not At Risk (09/28/2022)   Humiliation, Afraid, Rape, and Kick questionnaire    Fear of Current or Ex-Partner: No    Emotionally Abused: No    Physically Abused: No    Sexually Abused: No     Family History  Problem Relation Age of Onset   Depression Mother    Prostate cancer Father    Schizophrenia Sister    Depression Sister    Colon cancer Neg Hx    Breast cancer Neg Hx    Colon polyps  Neg Hx     The following portions of the patient's history were reviewed and updated as appropriate: allergies, current medications, past family history, past medical history, past social history, past surgical history and problem list.  Review of Systems Pertinent items noted in HPI and remainder of comprehensive ROS otherwise negative.   Objective:  BP 118/79 (BP Location: Right Arm, Patient Position: Sitting, Cuff Size: Normal)   Pulse (!) 105   Ht 5' 3.2" (1.605 m)   Wt 224 lb (101.6 kg)   LMP 05/19/2012   BMI 39.43 kg/m  Chaperone present CONSTITUTIONAL: Well-developed, well-nourished female in no acute distress.  HENT:  Normocephalic, atraumatic, External right and left ear normal. Oropharynx is clear and moist EYES: Conjunctivae and EOM are normal. Pupils are equal, round, and reactive to light. No scleral icterus.  NECK: Normal range of motion, supple, no masses.  Normal thyroid.  SKIN: Skin is warm and dry. No rash noted. Not diaphoretic. No erythema. No pallor. [place]: Alert and oriented to person, place, and time. Normal reflexes, muscle tone coordination. No cranial nerve deficit noted. PSYCHIATRIC: Normal mood and affect. Normal behavior. Normal judgment and thought content. CARDIOVASCULAR: Normal heart rate noted, regular rhythm RESPIRATORY: Clear to auscultation bilaterally. Effort and breath sounds normal, no problems with respiration noted. BREASTS: Symmetric in size. No masses, skin changes, nipple drainage, or lymphadenopathy. ABDOMEN: Soft, normal bowel sounds, no distention noted.  No tenderness, rebound or guarding.  PELVIC: Normal appearing external genitalia; normal appearing vaginal mucosa and cervix.  No abnormal discharge noted.  Pap smear obtained.   Normal uterine size, no other palpable masses, no uterine or adnexal tenderness.  Cervical polyp noted. Removed easily with ring forceps. Sent to pathology Monsel's applied Pt tolerated well.  MUSCULOSKELETAL: Normal range of motion. No tenderness.  No cyanosis, clubbing, or edema.  2+ distal pulses.   Assessment:  Annual gynecologic examination with pap smear Cervical polyp   Plan:  Will follow up results of pap smear and manage accordingly. Mammogram scheduled Polyp to pathology Routine preventative health maintenance measures emphasized. Please refer to After Visit Summary for other counseling recommendations.    Chancy Milroy, MD, [place] Attending [place] for [facility], [place]

## 2022-09-29 LAB — SURGICAL PATHOLOGY

## 2022-10-01 LAB — CYTOLOGY - PAP
Comment: NEGATIVE
Diagnosis: NEGATIVE
High risk HPV: NEGATIVE

## 2022-12-09 ENCOUNTER — Other Ambulatory Visit: Payer: Self-pay | Admitting: Internal Medicine

## 2022-12-09 ENCOUNTER — Encounter: Payer: Self-pay | Admitting: Cardiology

## 2022-12-09 ENCOUNTER — Ambulatory Visit: Payer: Managed Care, Other (non HMO) | Attending: Cardiology | Admitting: Cardiology

## 2022-12-09 VITALS — BP 134/76 | HR 100 | Ht 63.0 in | Wt 227.0 lb

## 2022-12-09 DIAGNOSIS — I10 Essential (primary) hypertension: Secondary | ICD-10-CM

## 2022-12-09 DIAGNOSIS — I5032 Chronic diastolic (congestive) heart failure: Secondary | ICD-10-CM

## 2022-12-09 DIAGNOSIS — E782 Mixed hyperlipidemia: Secondary | ICD-10-CM

## 2022-12-09 DIAGNOSIS — Z1231 Encounter for screening mammogram for malignant neoplasm of breast: Secondary | ICD-10-CM

## 2022-12-09 NOTE — Progress Notes (Signed)
Clinical Summary Ms. Mihalovich is a 62 y.o.female seen today for follow up of the following medical problems.    1. NICM /HFimpEF - 10/2013 echo LVEF 25-30%, grade I diastolic dysfunction   - cath 10/5359 with patent coronaries - repeat echo 02/2014 after course of medical therapy showed normalization of LVEF at 60-65%.      - no SOB/DOE, LE edema is controlled - compliant with meds     2. HTN   -compliant with meds - home bp's 120s/60s-70s   3. OSA -followed by Dr Vassie Loll    4. Hyperlipidemia - remains compliant with statin -07/2020 TG 196 HDL 30 LDL 58   - 10/2021 TC 128 TG 182 HDL 43 LDL 49 Jan 2024 TC 443 TG 154 HDL 42 LDL 53   Upcoming cruise to [place]    Past Medical History:  Diagnosis Date   CHF (congestive heart failure) (HCC)    Chronic back pain    Diabetes mellitus, type II (HCC)    Essential hypertension, benign    Hypercholesteremia    Neuromuscular disorder (HCC)    right foot   Sleep apnea      Allergies  Allergen Reactions   Bee Venom Anaphylaxis and Hives     Current Outpatient Medications  Medication Sig Dispense Refill   allopurinol (ZYLOPRIM) 100 MG tablet Take 100 mg by mouth 2 (two) times daily.     aspirin 81 MG tablet Take 81 mg by mouth daily.     atorvastatin (LIPITOR) 40 MG tablet Take 40 mg by mouth daily.     Calcium Citrate-Vitamin D (CALCIUM + D PO) Take 1 tablet by mouth daily.     carvedilol (COREG) 25 MG tablet Take 1 tablet (25 mg total) by mouth 2 (two) times daily. 180 tablet 3   colchicine 0.6 MG tablet Take 0.6-1.2 mg by mouth See admin instructions. Take 1.2 mg as needed at onset of gout flare may take an additional 0.6 mg an hour later as needed. Do not repeat for 3 days     empagliflozin (JARDIANCE) 25 MG TABS tablet Take 25 mg by mouth daily.     EPINEPHrine (EPI-PEN) 0.3 mg/0.3 mL SOAJ injection Inject 0.3 mg into the muscle as needed for anaphylaxis.     furosemide (LASIX) 40 MG tablet TAKE ONE AND ONE-HALF  TABLETS TWICE A DAY (Patient taking differently: Take 40 mg by mouth daily.) 270 tablet 0   gabapentin (NEURONTIN) 300 MG capsule Take 300 mg by mouth 3 (three) times daily.      glipiZIDE (GLUCOTROL) 10 MG tablet Take 10 mg by mouth 2 (two) times daily.     Krill Oil 1000 MG CAPS Take 1,000 mg by mouth daily.     lisinopril (ZESTRIL) 40 MG tablet Take 40 mg by mouth daily.     metFORMIN (GLUCOPHAGE) 1000 MG tablet Take 1 tablet by mouth 2 (two) times daily with a meal.     Semaglutide, 2 MG/DOSE, (OZEMPIC, 2 MG/DOSE,) 8 MG/3ML SOPN Inject 2 mg into the skin every Sunday.     silver sulfADIAZINE (SILVADENE) 1 % cream Apply 1 application. topically daily as needed (diabetic related sores).     spironolactone (ALDACTONE) 25 MG tablet Take 0.5 tablets (12.5 mg total) by mouth 2 (two) times daily. 30 tablet 5   No current facility-administered medications for this visit.     Past Surgical History:  Procedure Laterality Date   BACK SURGERY  Lumbar fusion   COLONOSCOPY  06/02/2012   Incomplete colonoscopy due to redundant colon.   COLONOSCOPY WITH PROPOFOL N/A 12/14/2021   Procedure: COLONOSCOPY WITH PROPOFOL;  Surgeon: Lanelle Bal, DO;  Location: [facility];  Service: Endoscopy;  Laterality: N/A;  2:00pm   LEFT AND RIGHT HEART CATHETERIZATION WITH CORONARY ANGIOGRAM N/A 10/24/2013   Procedure: LEFT AND RIGHT HEART CATHETERIZATION WITH CORONARY ANGIOGRAM;  Surgeon: Corky Crafts, MD;  Location: [facility] CATH LAB;  Service: Cardiovascular;  Laterality: N/A;   POLYPECTOMY  12/14/2021   Procedure: POLYPECTOMY;  Surgeon: Lanelle Bal, DO;  Location: [facility];  Service: Endoscopy;;   TUBAL LIGATION       Allergies  Allergen Reactions   Bee Venom Anaphylaxis and Hives      Family History  Problem Relation Age of Onset   Depression Mother    Prostate cancer Father    Schizophrenia Sister    Depression Sister    Colon cancer Neg Hx    Breast cancer Neg Hx    Colon  polyps Neg Hx      Social History Ms. Carreker reports that she has never smoked. She has never used smokeless tobacco. Ms. Jocelyn reports current alcohol use.   Review of Systems CONSTITUTIONAL: No weight loss, fever, chills, weakness or fatigue.  HEENT: Eyes: No visual loss, blurred vision, double vision or yellow sclerae.No hearing loss, sneezing, congestion, runny nose or sore throat.  SKIN: No rash or itching.  CARDIOVASCULAR: per hpi RESPIRATORY: No shortness of breath, cough or sputum.  GASTROINTESTINAL: No anorexia, nausea, vomiting or diarrhea. No abdominal pain or blood.  GENITOURINARY: No burning on urination, no polyuria NEUROLOGICAL: No headache, dizziness, syncope, paralysis, ataxia, numbness or tingling in the extremities. No change in bowel or bladder control.  MUSCULOSKELETAL: No muscle, back pain, joint pain or stiffness.  LYMPHATICS: No enlarged nodes. No history of splenectomy.  PSYCHIATRIC: No history of depression or anxiety.  ENDOCRINOLOGIC: No reports of sweating, cold or heat intolerance. No polyuria or polydipsia.  Marland Kitchen   Physical Examination Today's Vitals   12/09/22 1124  BP: 134/76  Pulse: 100  SpO2: 94%  Weight: 227 lb (103 kg)  Height:  (1.6 m)   Body mass index is 40.21 kg/m.  Gen: resting comfortably, no acute distress HEENT: no scleral icterus, pupils equal round and reactive, no palptable cervical adenopathy,  CV: RRR, no mrg, no jvd Resp: Clear to auscultation bilaterally GI: abdomen is soft, non-tender, non-distended, normal bowel sounds, no hepatosplenomegaly MSK: extremities are warm, no edema.  Skin: warm, no rash Neuro:  no focal deficits Psych: appropriate affect   Diagnostic Studies  10/2013 Echo   Study Conclusions  - Left ventricle: The cavity size was normal. Wall thickness was increased in a pattern of mild LVH. Systolic function was severely reduced. The estimated ejection fraction was in the range of 25% to 30%.  Possible severe hypokinesis of the anteroseptal myocardium. Doppler parameters are consistent with abnormal left ventricular relaxation (grade 1 diastolic dysfunction). - Aortic valve: Poorly visualized. Mildly calcified annulus. Probably trileaflet. No significant regurgitation. - Right ventricle: Systolic function was reduced. - Right atrium: Central venous pressure: 3mm Hg (est). - Tricuspid valve: Physiologic regurgitation. - Pulmonary arteries: Systolic pressure could not be accurately estimated. - Pericardium, extracardiac: A prominent pericardial fat pad was present. Impressions:  - Extremely limited study. There is mild LVH, overall normal LV chamber size, LVEF roughly estimated at 25-30% based on available images. There appears  to be significant anteroseptal hypokinesis based on some views. Optison contrast may provide better images. There is grade 1 diastolic dysfunction. RV contraction is abnormal as well. No significant degree of mitral or tricuspid regurgitation noted, cannotassess PASP. CVP appears normal. Epicardial fat pad noted.  10/2013 Cath   Procedural Findings:   Hemodynamics:   AO: 172/87 mmHg   LV: 165/20 mmHg   LVEDP: 25 mmHg   Coronary angiography:   Coronary dominance: right   Left Main: normal   Left Anterior Descending (LAD): Normal in size with no significant disease.   1st diagonal (D1): Normal in size with no significant disease.   2nd diagonal (D2): Large in size with no significant disease.   3rd diagonal (D3): Normal in size with no significant disease.   Circumflex (LCx): Normal in size and nondominant the vessel is free of any significant disease.   1st obtuse marginal: Small in size with minor irregularities.   2nd obtuse marginal: Normal in size with no significant disease.   3rd obtuse marginal: Normal in size with no significant disease   Right Coronary Artery: normal in size and dominant the vessel is free of any significant disease.    Posterior descending artery: large in size with no significant disease   Posterior AV segment: normal in size with no significant disease.   Posterolateral branchs: 3 normal size branchs which are free of significant disease. Left ventriculography: Left ventricular systolic function is severely reduced , LVEF is estimated at 25-30 %, there is no significant mitral regurgitation   Final Conclusions:   1. Normal coronary arteries.   2. Severely reduced LV systolic function with an ejection fraction of 25-30% due to nonischemic cardiomyopathy.   3. Moderately elevated left ventricular end-diastolic pressure   Recommendations:   Continue diuresis. Treat medically for nonischemic cardiomyopathy. Blood pressure control is recommended. The patient should be screened for sleep apnea and treated as indicated.       02/2014 Echo Study Conclusions  - Procedure narrative: Transthoracic echocardiography. Image   quality was adequate. The study was technically difficult, as a   result of poor sound wave transmission and body habitus.   Intravenous contrast (Optison) was administered. - Left ventricle: The cavity size was normal. Wall thickness was   normal. Systolic function was normal. The estimated ejection   fraction was in the range of 60% to 65%. Wall motion was normal;   there were no regional wall motion abnormalities. Left   ventricular diastolic function parameters were normal. - Right ventricle: Not well visualized. Grossly appears normal in   size and function. RV TAPSE is 2.3 cm.   12/2013 sleep apnea IMPRESSION:  1. Moderate obstructive sleep apnea syndrome worse during REM sleep. A formal sleep titration CPAP recording is suggested. 2. Abnormal sleep architecture with poor sleep efficiency, fragmented sleep and absence of slow wave sleep.   Thanks for this referral.     Assessment and Plan  1. HFimpEF - initially LVEF 25-30% by echo 10/2013, with medical therapy normalization of  LVEF to 60-65%.  - no recent symptoms, edema overall controlled with current lasix dosing - continue current meds   2. HTN   -she is at goal, continue current meds     3. Hyperlipidemia LDL at goal, continue current meds. Discussed dietary changes to lower TGs   F/u 1 year   Antoine Poche, M.D.

## 2022-12-09 NOTE — Patient Instructions (Signed)
Medication Instructions:  Your physician recommends that you continue on your current medications as directed. Please refer to the Current Medication list given to you today.  *If you need a refill on your cardiac medications before your next appointment, please call your pharmacy*   Lab Work: None If you have labs (blood work) drawn today and your tests are completely normal, you will receive your results only by: MyChart Message (if you have MyChart) OR A paper copy in the mail If you have any lab test that is abnormal or we need to change your treatment, we will call you to review the results.   Testing/Procedures: None   Follow-Up: At [facility], you and your health needs are our priority.  As part of our continuing mission to provide you with exceptional heart care, we have created designated Provider Care Teams.  These Care Teams include your primary Cardiologist (physician) and Advanced Practice Providers (APPs -  Physician Assistants and Nurse Practitioners) who all work together to provide you with the care you need, when you need it.  We recommend signing up for the patient portal called "MyChart".  Sign up information is provided on this After Visit Summary.  MyChart is used to connect with patients for Virtual Visits (Telemedicine).  Patients are able to view lab/test results, encounter notes, upcoming appointments, etc.  Non-urgent messages can be sent to your provider as well.   To learn more about what you can do with MyChart, go to [email or web address].    Your next appointment:   1 year(s)  Provider:   Jonathan Branch, MD    Other Instructions    

## 2022-12-17 ENCOUNTER — Telehealth: Payer: Self-pay | Admitting: Cardiology

## 2022-12-17 NOTE — Telephone Encounter (Signed)
   Patient Name: Bridget Little  DOB: Sep 18, 1960 MRN: [id number]  Primary Cardiologist: Dina Rich, MD  Chart reviewed as part of pre-operative protocol coverage. Cataract extractions are recognized in guidelines as low risk surgeries that do not typically require specific preoperative testing or holding of blood thinner therapy. Therefore, given past medical history and time since last visit, based on ACC/AHA guidelines, Bridget Little would be at acceptable risk for the planned procedure without further cardiovascular testing.   I will route this recommendation to the requesting party via Epic fax function and remove from pre-op pool.  Please call with questions.  Joylene Grapes, NP 12/17/2022, 1:29 PM

## 2022-12-17 NOTE — Telephone Encounter (Signed)
   Pre-operative Risk Assessment    Patient Name: Bridget Little  DOB: 12-Aug-1961 MRN: [id number]      Request for Surgical Clearance    Procedure:   Cataract extraction w/Intraocular Lens implantation of the LEFT EYE, followed by the RIGHT EYE.  Date of Surgery:  Clearance 03/16/23                                 Surgeon:  Not indicated Surgeon's Group or Practice Name:  [facility] Phone number:  [phone] Fax number:  [phone]   Type of Clearance Requested:   - Medical    Type of Anesthesia:   Topical anesthesia w/IV medication.   Additional requests/questions:    Signed, Seymour Bars   12/17/2022, 12:03 PM

## 2022-12-31 ENCOUNTER — Ambulatory Visit
Admission: RE | Admit: 2022-12-31 | Discharge: 2022-12-31 | Disposition: A | Discharge status: Home / Self Care | Payer: Managed Care, Other (non HMO) | Source: Ambulatory Visit | Attending: Internal Medicine | Admitting: Internal Medicine

## 2022-12-31 DIAGNOSIS — Z1231 Encounter for screening mammogram for malignant neoplasm of breast: Secondary | ICD-10-CM | POA: Diagnosis present

## 2023-03-18 ENCOUNTER — Telehealth: Payer: Self-pay | Admitting: *Deleted

## 2023-03-18 DIAGNOSIS — G4733 Obstructive sleep apnea (adult) (pediatric): Secondary | ICD-10-CM

## 2023-03-18 NOTE — Telephone Encounter (Signed)
Patient called and states that her DME company [employer] said her CMN certification is now expired and needs a new certification.   Patient last seen on 04/2022 with Dr. Vassie Loll. Patient does not currently have an appointment.   Informed patient we do not have any openings for our [place] Office until after the schedule is released. I offered an appointment at another office, but patient declined at this time and would like to stay in [place] office. Patient is currently on wait list/recall list for [facility].  Please call and advise patient regarding supplies. [phone]

## 2023-03-18 NOTE — Telephone Encounter (Signed)
Pt not due for appt until mid Sept 2024  I placed order for renewal of her CPAP supplies  Spoke with the pt and notified this was done  Nothing further needed

## 2023-03-28 ENCOUNTER — Ambulatory Visit (HOSPITAL_COMMUNITY): Payer: Managed Care, Other (non HMO) | Admitting: Psychiatry

## 2023-04-16 ENCOUNTER — Other Ambulatory Visit: Payer: Self-pay

## 2023-04-16 ENCOUNTER — Emergency Department (HOSPITAL_COMMUNITY): Payer: Managed Care, Other (non HMO)

## 2023-04-16 ENCOUNTER — Observation Stay (HOSPITAL_COMMUNITY)
Admission: EM | Admit: 2023-04-16 | Discharge: 2023-04-18 | Disposition: A | Discharge status: Home / Self Care | Payer: Managed Care, Other (non HMO) | Attending: Internal Medicine | Admitting: Internal Medicine

## 2023-04-16 ENCOUNTER — Encounter (HOSPITAL_COMMUNITY): Payer: Self-pay | Admitting: Emergency Medicine

## 2023-04-16 DIAGNOSIS — M6281 Muscle weakness (generalized): Secondary | ICD-10-CM | POA: Insufficient documentation

## 2023-04-16 DIAGNOSIS — E162 Hypoglycemia, unspecified: Principal | ICD-10-CM | POA: Insufficient documentation

## 2023-04-16 DIAGNOSIS — R531 Weakness: Secondary | ICD-10-CM

## 2023-04-16 DIAGNOSIS — Z79899 Other long term (current) drug therapy: Secondary | ICD-10-CM | POA: Diagnosis not present

## 2023-04-16 DIAGNOSIS — I10 Essential (primary) hypertension: Secondary | ICD-10-CM | POA: Diagnosis present

## 2023-04-16 DIAGNOSIS — N179 Acute kidney failure, unspecified: Secondary | ICD-10-CM | POA: Diagnosis not present

## 2023-04-16 DIAGNOSIS — I5032 Chronic diastolic (congestive) heart failure: Secondary | ICD-10-CM | POA: Diagnosis not present

## 2023-04-16 DIAGNOSIS — E8809 Other disorders of plasma-protein metabolism, not elsewhere classified: Secondary | ICD-10-CM | POA: Diagnosis not present

## 2023-04-16 DIAGNOSIS — U071 COVID-19: Secondary | ICD-10-CM | POA: Diagnosis not present

## 2023-04-16 DIAGNOSIS — E46 Unspecified protein-calorie malnutrition: Secondary | ICD-10-CM | POA: Insufficient documentation

## 2023-04-16 DIAGNOSIS — Z7982 Long term (current) use of aspirin: Secondary | ICD-10-CM | POA: Insufficient documentation

## 2023-04-16 DIAGNOSIS — G4733 Obstructive sleep apnea (adult) (pediatric): Secondary | ICD-10-CM | POA: Diagnosis present

## 2023-04-16 DIAGNOSIS — Z6838 Body mass index (BMI) 38.0-38.9, adult: Secondary | ICD-10-CM | POA: Diagnosis not present

## 2023-04-16 DIAGNOSIS — I110 Hypertensive heart disease with heart failure: Secondary | ICD-10-CM | POA: Insufficient documentation

## 2023-04-16 DIAGNOSIS — R059 Cough, unspecified: Secondary | ICD-10-CM | POA: Diagnosis present

## 2023-04-16 DIAGNOSIS — R2681 Unsteadiness on feet: Secondary | ICD-10-CM | POA: Insufficient documentation

## 2023-04-16 DIAGNOSIS — R2689 Other abnormalities of gait and mobility: Secondary | ICD-10-CM | POA: Insufficient documentation

## 2023-04-16 DIAGNOSIS — E669 Obesity, unspecified: Secondary | ICD-10-CM | POA: Insufficient documentation

## 2023-04-16 DIAGNOSIS — E11649 Type 2 diabetes mellitus with hypoglycemia without coma: Secondary | ICD-10-CM | POA: Diagnosis not present

## 2023-04-16 DIAGNOSIS — E782 Mixed hyperlipidemia: Secondary | ICD-10-CM

## 2023-04-16 DIAGNOSIS — I428 Other cardiomyopathies: Secondary | ICD-10-CM

## 2023-04-16 LAB — CBG MONITORING, ED
Glucose-Capillary: 112 mg/dL — ABNORMAL HIGH (ref 70–99)
Glucose-Capillary: 112 mg/dL — ABNORMAL HIGH (ref 70–99)
Glucose-Capillary: 126 mg/dL — ABNORMAL HIGH (ref 70–99)
Glucose-Capillary: 144 mg/dL — ABNORMAL HIGH (ref 70–99)
Glucose-Capillary: 46 mg/dL — ABNORMAL LOW (ref 70–99)
Glucose-Capillary: 53 mg/dL — ABNORMAL LOW (ref 70–99)
Glucose-Capillary: 87 mg/dL (ref 70–99)

## 2023-04-16 LAB — URINALYSIS, W/ REFLEX TO CULTURE (INFECTION SUSPECTED)
Bilirubin Urine: NEGATIVE
Glucose, UA: 150 mg/dL — AB
Ketones, ur: NEGATIVE mg/dL
Nitrite: NEGATIVE
Protein, ur: NEGATIVE mg/dL
Specific Gravity, Urine: 1.006 (ref 1.005–1.030)
pH: 5 (ref 5.0–8.0)

## 2023-04-16 LAB — COMPREHENSIVE METABOLIC PANEL
ALT: 25 U/L (ref 0–44)
AST: 46 U/L — ABNORMAL HIGH (ref 15–41)
Albumin: 2.8 g/dL — ABNORMAL LOW (ref 3.5–5.0)
Alkaline Phosphatase: 55 U/L (ref 38–126)
Anion gap: 9 (ref 5–15)
BUN: 49 mg/dL — ABNORMAL HIGH (ref 8–23)
CO2: 21 mmol/L — ABNORMAL LOW (ref 22–32)
Calcium: 8.1 mg/dL — ABNORMAL LOW (ref 8.9–10.3)
Chloride: 103 mmol/L (ref 98–111)
Creatinine, Ser: 1.74 mg/dL — ABNORMAL HIGH (ref 0.44–1.00)
GFR, Estimated: 33 mL/min — ABNORMAL LOW (ref >60)
Glucose, Bld: 84 mg/dL (ref 70–99)
Potassium: 4.5 mmol/L (ref 3.5–5.1)
Sodium: 133 mmol/L — ABNORMAL LOW (ref 135–145)
Total Bilirubin: 0.5 mg/dL (ref 0.3–1.2)
Total Protein: 6 g/dL — ABNORMAL LOW (ref 6.5–8.1)

## 2023-04-16 LAB — CBC
HCT: 36.2 % (ref 36.0–46.0)
Hemoglobin: 11.4 g/dL — ABNORMAL LOW (ref 12.0–15.0)
MCH: 27.1 pg (ref 26.0–34.0)
MCHC: 31.5 g/dL (ref 30.0–36.0)
MCV: 86 fL (ref 80.0–100.0)
Platelets: 211 10*3/uL (ref 150–400)
RBC: 4.21 MIL/uL (ref 3.87–5.11)
RDW: 18.2 % — ABNORMAL HIGH (ref 11.5–15.5)
WBC: 7 10*3/uL (ref 4.0–10.5)
nRBC: 0 % (ref 0.0–0.2)

## 2023-04-16 LAB — LACTIC ACID, PLASMA: Lactic Acid, Venous: 1.6 mmol/L (ref 0.5–1.9)

## 2023-04-16 LAB — VITAMIN B12: Vitamin B-12: 954 pg/mL — ABNORMAL HIGH (ref 180–914)

## 2023-04-16 LAB — GLUCOSE, CAPILLARY
Glucose-Capillary: 65 mg/dL — ABNORMAL LOW (ref 70–99)
Glucose-Capillary: 72 mg/dL (ref 70–99)
Glucose-Capillary: 85 mg/dL (ref 70–99)
Glucose-Capillary: 98 mg/dL (ref 70–99)
Glucose-Capillary: 138 mg/dL — ABNORMAL HIGH (ref 70–99)

## 2023-04-16 LAB — BRAIN NATRIURETIC PEPTIDE: B Natriuretic Peptide: 18 pg/mL (ref 0.0–100.0)

## 2023-04-16 LAB — D-DIMER, QUANTITATIVE: D-Dimer, Quant: 1.06 ug{FEU}/mL — ABNORMAL HIGH (ref 0.00–0.50)

## 2023-04-16 LAB — FOLATE: Folate: 13 ng/mL (ref >5.9)

## 2023-04-16 LAB — TSH: TSH: 1.851 u[IU]/mL (ref 0.350–4.500)

## 2023-04-16 LAB — C-REACTIVE PROTEIN: CRP: 12.2 mg/dL — ABNORMAL HIGH (ref <1.0)

## 2023-04-16 LAB — T4, FREE: Free T4: 1.03 ng/dL (ref 0.61–1.12)

## 2023-04-16 LAB — SARS CORONAVIRUS 2 BY RT PCR: SARS Coronavirus 2 by RT PCR: POSITIVE — AB

## 2023-04-16 LAB — LACTATE DEHYDROGENASE: LDH: 225 U/L — ABNORMAL HIGH (ref 98–192)

## 2023-04-16 MED ORDER — ACETAMINOPHEN 325 MG PO TABS: 650.0000 mg | ORAL_TABLET | Freq: Four times a day (QID) | ORAL | Status: DC | PRN

## 2023-04-16 MED ORDER — ENOXAPARIN SODIUM 40 MG/0.4ML IJ SOSY
40.0000 mg | PREFILLED_SYRINGE | Freq: Every day | INTRAMUSCULAR | Status: DC
  Administered 2023-04-16 – 2023-04-18 (×3): 40 mg via SUBCUTANEOUS
  Filled 2023-04-16 (×3): qty 0.4

## 2023-04-16 MED ORDER — DEXTROSE 50 % IV SOLN
INTRAVENOUS | Status: AC
  Administered 2023-04-16: 50 mL
  Filled 2023-04-16: qty 50

## 2023-04-16 MED ORDER — ONDANSETRON HCL 4 MG PO TABS: 4.0000 mg | ORAL_TABLET | Freq: Four times a day (QID) | ORAL | Status: DC | PRN

## 2023-04-16 MED ORDER — ONDANSETRON HCL 4 MG/2ML IJ SOLN: 4.0000 mg | Freq: Four times a day (QID) | INTRAMUSCULAR | Status: DC | PRN

## 2023-04-16 MED ORDER — ATORVASTATIN CALCIUM 40 MG PO TABS
40.0000 mg | ORAL_TABLET | Freq: Every day | ORAL | Status: DC
  Administered 2023-04-16 – 2023-04-17 (×2): 40 mg via ORAL
  Filled 2023-04-16 (×2): qty 1

## 2023-04-16 MED ORDER — SODIUM CHLORIDE 0.9 % IV SOLN: INTRAVENOUS | Status: DC

## 2023-04-16 MED ORDER — DEXTROSE 5 % AND 0.45 % NACL IV BOLUS
250.0000 mL | Freq: Once | INTRAVENOUS | Status: AC
  Administered 2023-04-16: 250 mL via INTRAVENOUS

## 2023-04-16 MED ORDER — SODIUM CHLORIDE 0.9 % IV SOLN: Freq: Once | INTRAVENOUS | Status: DC

## 2023-04-16 MED ORDER — GLUCERNA SHAKE PO LIQD
237.0000 mL | Freq: Three times a day (TID) | ORAL | Status: DC
  Administered 2023-04-16 – 2023-04-18 (×6): 237 mL via ORAL

## 2023-04-16 MED ORDER — IPRATROPIUM-ALBUTEROL 0.5-2.5 (3) MG/3ML IN SOLN
3.0000 mL | Freq: Once | RESPIRATORY_TRACT | Status: AC
  Administered 2023-04-16: 3 mL via RESPIRATORY_TRACT
  Filled 2023-04-16: qty 3

## 2023-04-16 MED ORDER — ONDANSETRON HCL 4 MG/2ML IJ SOLN: 4.0000 mg | Freq: Once | INTRAMUSCULAR | Status: DC

## 2023-04-16 MED ORDER — ACETAMINOPHEN 650 MG RE SUPP: 650.0000 mg | Freq: Four times a day (QID) | RECTAL | Status: DC | PRN

## 2023-04-16 NOTE — Progress Notes (Signed)
PROGRESS NOTE  Bridget Little WJX:[id number] DOB: 1960/12/07 DOA: 04/16/2023 PCP: Claretta Fraise, DO  Brief History:  62 year old female with a history of diabetes mellitus type 2, hypertension, hyperlipidemia, OSA, HFimpEF presenting with 2 to 3-day history of generalized weakness, malaise, coughing.  She denied any fevers, chills, headache, neck pain, nausea, vomiting.  She did have some loose stools.  She denies any chest pain or abdominal pain.  There is no hematochezia or melena.  She denies any abdominal pain.  There is no hematuria.  There is no dysuria.  She had a nonproductive cough.  There is no hemoptysis.  She states that her appetite has been poor.  She continues take all her medications including her glipizide and Jardiance.  The patient spouse noted the patient to be a bit confused.  As result, EMS was activated.  EMS noted the patient to have glucose of 46.  The patient was given some dextrose with improvement of her mentation.  She was brought to the emergency department for further evaluation and treatment.  During her stay in the emergency department, the patient was given up to 4 A of D50.  She continued to become hypoglycemic.  As result she was admitted for further evaluation and treatment. In the ED, the patient was afebrile and hemodynamically stable albeit with soft blood pressures.  Oxygen saturation is 96% on room air.  WBC 7.0, hemoglobin 11.4, platelets 211,000.  Sodium 133, potassium 4.5, bicarbonate 21, serum creatinine 1.74.  AST 46, ALT 25, alkaline phosphatase 55, total bilirubin 0.5.  Chest x-ray showed increased interstitial markings.  There is poor inspiration.  COVID-19 PCR was positive.   Assessment/Plan: Generalized weakness/COVID-19 infection -Secondary to COVID-19 infection/pneumonia -Stable on room air -Check CRP -Check D-dimer -Check LDH -Serum B12 -TSH -Folic acid -UA -PT eval  Acute kidney injury -Secondary to volume  depletion -Baseline creatinine 0.8-1.0 -Patient presented with serum creatinine is 1.74 -Restart IV fluids  Hyponatremia -Secondary to volume depletion and poor solute intake -Restart IV fluids  Diabetes mellitus type 2, with hypoglycemia -03/21/2023 hemoglobin A1c 6.5 -Holding glipizide and Jardiance -Had to be given four doses of D50 -monitor CBGs  Class II obesity -BMI 38.98 -Less than modification  Chronic HFimpEF -10/23/2013 echo EF 25-30%, grade 1 DD -7-15 echo EF 60 to 65%, no WMA -clinically dry  Essential HTN -holding lisinopril due to AKI -holding spironolactone    Family Communication:   spouse updated at bedside 8/24  Consultants:  none  Code Status:  FULL   DVT Prophylaxis:   [place] Lovenox   Procedures: As Listed in Progress Note Above  Antibiotics: None    Total time spent 50 minutes.  Greater than 50% spent face to face counseling and coordinating care.    Subjective: Patient has nonproductive cough.  She feels weak.  She denies any nausea, vomiting.  She has loose stool.  There is no hematochezia or melena.  She denies any chest pain abdominal pain.  Objective: Vitals:   04/16/23 0445 04/16/23 0522 04/16/23 0600 04/16/23 0631  BP: 116/63   (!) 112/59  Pulse: 97   98  Resp: (!) 24   19  Temp:  98 F (36.7 C)  99.2 F (37.3 C)  TempSrc:    Oral  SpO2: 92%   96%  Weight:   103 kg   Height:   5\' 4"  (1.626 m)     Intake/Output Summary (Last 24 hours) at 04/16/2023  1116 Last data filed at 04/16/2023 0519 Gross per 24 hour  Intake 250 ml  Output --  Net 250 ml   Weight change:  Exam:  General:  Pt is alert, follows commands appropriately, not in acute distress HEENT: No icterus, No thrush, No neck mass, [place]/AT Cardiovascular: RRR, S1/S2, no rubs, no gallops Respiratory: Bilateral rales.  No wheezing.  Good air movement. Abdomen: Soft/+BS, non tender, non distended, no guarding Extremities: No edema, No lymphangitis, No petechiae, No  rashes, no synovitis   Data Reviewed: I have personally reviewed following labs and imaging studies Basic Metabolic Panel: Recent Labs  Lab 04/16/23 0045  NA 133*  K 4.5  CL 103  CO2 21*  GLUCOSE 84  BUN 49*  CREATININE 1.74*  CALCIUM 8.1*   Liver Function Tests: Recent Labs  Lab 04/16/23 0045  AST 46*  ALT 25  ALKPHOS 55  BILITOT 0.5  PROT 6.0*  ALBUMIN 2.8*   No results for input(s): "LIPASE", "AMYLASE" in the last 168 hours. No results for input(s): "AMMONIA" in the last 168 hours. Coagulation Profile: No results for input(s): "INR", "PROTIME" in the last 168 hours. CBC: Recent Labs  Lab 04/16/23 0045  WBC 7.0  HGB 11.4*  HCT 36.2  MCV 86.0  PLT 211   Cardiac Enzymes: No results for input(s): "CKTOTAL", "CKMB", "CKMBINDEX", "TROPONINI" in the last 168 hours. BNP: Invalid input(s): "POCBNP" CBG: Recent Labs  Lab 04/16/23 0441 04/16/23 0527 04/16/23 0633 04/16/23 0715 04/16/23 1109  GLUCAP 144* 112* 65* 72 85   HbA1C: No results for input(s): "HGBA1C" in the last 72 hours. Urine analysis: No results found for: "COLORURINE", "APPEARANCEUR", "LABSPEC", "PHURINE", "GLUCOSEU", "HGBUR", "BILIRUBINUR", "KETONESUR", "PROTEINUR", "UROBILINOGEN", "NITRITE", "LEUKOCYTESUR" Sepsis Labs: @LABRCNTIP (procalcitonin:4,lacticidven:4) ) Recent Results (from the past 240 hour(s))  SARS Coronavirus 2 by RT PCR (hospital order, performed in [facility] hospital lab) *cepheid single result test* Anterior Nasal Swab     Status: Abnormal   Collection Time: 04/16/23  3:50 AM   Specimen: Anterior Nasal Swab  Result Value Ref Range Status   SARS Coronavirus 2 by RT PCR POSITIVE (A) NEGATIVE Final    Comment: (NOTE) SARS-CoV-2 target nucleic acids are DETECTED  SARS-CoV-2 RNA is generally detectable in upper respiratory specimens  during the acute phase of infection.  Positive results are indicative  of the presence of the identified virus, but do not rule  out bacterial infection or co-infection with other pathogens not detected by the test.  Clinical correlation with patient history and  other diagnostic information is necessary to determine patient infection status.  The expected result is negative.  Fact Sheet for Patients:   RoadLapTop.co.za   Fact Sheet for Healthcare Providers:   http://kim-miller.com/    This test is not yet approved or cleared by the [place] FDA and  has been authorized for detection and/or diagnosis of SARS-CoV-2 by FDA under an Emergency Use Authorization (EUA).  This EUA will remain in effect (meaning this test can be used) for the duration of  the COVID-19 declaration under Section 564(b)(1)  of the Act, 21 U.S.C. section 360-bbb-3(b)(1), unless the authorization is terminated or revoked sooner.   Performed at [facility], [address], [place], [place] [address]      Scheduled Meds:  enoxaparin (LOVENOX) injection  40 mg Subcutaneous Daily   feeding supplement (GLUCERNA SHAKE)  237 mL Oral TID BM   Continuous Infusions:  Procedures/Studies: DG Chest Portable 1 View  Result Date: 04/16/2023 CLINICAL DATA:  Cough and COVID-19 positivity EXAM: PORTABLE CHEST 1 VIEW COMPARISON:  10/22/2013 FINDINGS: Cardiac shadow is mildly prominent but stable. Lungs are well aerated bilaterally. No focal infiltrate is seen. Mild central vascular congestion is noted. No bony abnormality is noted. IMPRESSION: Mild central vascular congestion without significant edema. Electronically Signed   By: Alcide Clever M.D.   On: 04/16/2023 01:41    Catarina Hartshorn, DO  [facility]  If 7PM-7AM, please contact night-coverage [email or web address] Password [facility] 04/16/2023, 11:16 AM   LOS: 0 days

## 2023-04-16 NOTE — Hospital Course (Signed)
62 year old female with a history of diabetes mellitus type 2, hypertension, hyperlipidemia, OSA, HFimpEF presenting with 2 to 3-day history of generalized weakness, malaise, coughing.  She denied any fevers, chills, headache, neck pain, nausea, vomiting.  She did have some loose stools.  She denies any chest pain or abdominal pain.  There is no hematochezia or melena.  She denies any abdominal pain.  There is no hematuria.  There is no dysuria.  She had a nonproductive cough.  There is no hemoptysis.  She states that her appetite has been poor.  She continues take all her medications including her glipizide and Jardiance.  The patient spouse noted the patient to be a bit confused.  As result, EMS was activated.  EMS noted the patient to have glucose of 46.  The patient was given some dextrose with improvement of her mentation.  She was brought to the emergency department for further evaluation and treatment.  During her stay in the emergency department, the patient was given up to 4 A of D50.  She continued to become hypoglycemic.  As result she was admitted for further evaluation and treatment. In the ED, the patient was afebrile and hemodynamically stable albeit with soft blood pressures.  Oxygen saturation is 96% on room air.  WBC 7.0, hemoglobin 11.4, platelets 211,000.  Sodium 133, potassium 4.5, bicarbonate 21, serum creatinine 1.74.  AST 46, ALT 25, alkaline phosphatase 55, total bilirubin 0.5.  Chest x-ray showed increased interstitial markings.  There is poor inspiration.  COVID-19 PCR was positive.

## 2023-04-16 NOTE — ED Triage Notes (Signed)
Pt to ED from home via EMS c/o AMS tonight.  Per EMS patient had CBG of 46, given D10 with rebound CBG 178 and improvement in mentation.  Pt also took home COVID test on 21st and was positive.  Also c/o diarrhea x2 days, >10 episodes in last 24 hours.  Hx of DBM and takes metformin and jardiance.  Pt arrives A&Ox4, denies pain, in NAD at this time.

## 2023-04-16 NOTE — Progress Notes (Signed)
PT refuses therapy at this time states that she is not ready.   Will see tomorrow.   Virgina Organ, PT CLT [phone]

## 2023-04-16 NOTE — ED Notes (Signed)
Repeat CBG 46 communicated with EDP [place]. Pt remains alert. Provided with oral soda. Was given IV dextrose by Seward Speck. Husband at bedside

## 2023-04-16 NOTE — H&P (Addendum)
History and Physical    Patient: Bridget Little DOB: 1960/09/26 DOA: 04/16/2023 DOS: the patient was seen and examined on 04/16/2023 PCP: Claretta Fraise, DO  Patient coming from: Home  Chief Complaint:  Chief Complaint  Patient presents with   COVID Positive   Hypoglycemia   Altered Mental Status   HPI: Bridget Little is a 62 y.o. female with medical history significant of T2DM, hyperlipidemia, hypertension, nonischemic cardiomyopathy, obstructive sleep apnea presents to the emergency department from home via EMS due to altered mental status and low level of blood glucose at 46.  Patient was diagnosed with COVID-19 virus about 2 days ago and she endorsed decreased appetite and lack of energy.  She presented with altered mental status at home tonight and EMS was activated.  On arrival of EMS team, blood glucose was noted to be at 46, D50 was given with improvement in patient's mentation.  Patient states that she continues to be compliant with diabetic medications including Jardiance and glipizide despite decreased appetite.  Patient was accompanied by her husband  ED Course:  In the emergency department, BP was 131/56, other vital signs were within normal range.  Workup in the ED showed normal CBC except for hemoglobin of 11.4.  Sodium 133, potassium 4.5, chloride 103, bicarb 21, blood glucose 84, BUN 49, creatinine 1.74, calcium 8.1, albumin 2.8, AST 46, ALT 25, GFR 33.  Lactic acid was normal, BNP was 18.  SARS coronavirus 2 was positive.  Chest x-ray showed mild central vascular congestion without significant edema.  Patient's BP dropped to 53 while in the ED and she was started on IV D5 1/2 NS. Hospitalist was asked to admit patient for further evaluation and management.  Review of Systems: Review of systems as noted in the HPI. All other systems reviewed and are negative.   Past Medical History:  Diagnosis Date   CHF (congestive heart failure) (HCC)     Chronic back pain    Diabetes mellitus, type II (HCC)    Essential hypertension, benign    Hypercholesteremia    Neuromuscular disorder (HCC)    right foot   Sleep apnea    Past Surgical History:  Procedure Laterality Date   BACK SURGERY     Lumbar fusion   COLONOSCOPY  06/02/2012   Incomplete colonoscopy due to redundant colon.   COLONOSCOPY WITH PROPOFOL N/A 12/14/2021   Procedure: COLONOSCOPY WITH PROPOFOL;  Surgeon: Lanelle Bal, DO;  Location: [facility];  Service: Endoscopy;  Laterality: N/A;  2:00pm   LEFT AND RIGHT HEART CATHETERIZATION WITH CORONARY ANGIOGRAM N/A 10/24/2013   Procedure: LEFT AND RIGHT HEART CATHETERIZATION WITH CORONARY ANGIOGRAM;  Surgeon: Corky Crafts, MD;  Location: [facility] CATH LAB;  Service: Cardiovascular;  Laterality: N/A;   POLYPECTOMY  12/14/2021   Procedure: POLYPECTOMY;  Surgeon: Lanelle Bal, DO;  Location: [facility];  Service: Endoscopy;;   TUBAL LIGATION      Social History:  reports that she has never smoked. She has never used smokeless tobacco. She reports current alcohol use. She reports that she does not use drugs.   Allergies  Allergen Reactions   Bee Venom Anaphylaxis and Hives    Family History  Problem Relation Age of Onset   Depression Mother    Prostate cancer Father    Schizophrenia Sister    Depression Sister    Colon cancer Neg Hx    Breast cancer Neg Hx    Colon polyps Neg Hx  Prior to Admission medications   Medication Sig Start Date End Date Taking? Authorizing Provider  allopurinol (ZYLOPRIM) 100 MG tablet Take 100 mg by mouth 2 (two) times daily. 09/12/20   [provider]  aspirin 81 MG tablet Take 81 mg by mouth daily.    [provider]  atorvastatin (LIPITOR) 40 MG tablet Take 40 mg by mouth daily.    [provider]  Calcium Citrate-Vitamin D (CALCIUM + D PO) Take 1 tablet by mouth daily.    [provider]  carvedilol (COREG) 25 MG tablet Take 1  tablet (25 mg total) by mouth 2 (two) times daily. 02/04/21   Antoine Poche, MD  colchicine 0.6 MG tablet Take 0.6-1.2 mg by mouth See admin instructions. Take 1.2 mg as needed at onset of gout flare may take an additional 0.6 mg an hour later as needed. Do not repeat for 3 days 04/30/15   [provider]  diclofenac Sodium (VOLTAREN) 1 % GEL Apply topically 2 (two) times daily as needed. 09/03/22 09/03/23  [provider]  empagliflozin (JARDIANCE) 25 MG TABS tablet Take 25 mg by mouth daily.    [provider]  EPINEPHrine (EPI-PEN) 0.3 mg/0.3 mL SOAJ injection Inject 0.3 mg into the muscle as needed for anaphylaxis.    [provider]  furosemide (LASIX) 40 MG tablet Take 40 mg by mouth daily. 06/27/20   [provider]  gabapentin (NEURONTIN) 300 MG capsule Take 300 mg by mouth 3 (three) times daily.  02/28/15   [provider]  glipiZIDE (GLUCOTROL) 10 MG tablet Take 10 mg by mouth 2 (two) times daily.    [provider]  Boris Lown Oil 1000 MG CAPS Take 1,000 mg by mouth daily.    [provider]  lisinopril (ZESTRIL) 40 MG tablet Take 40 mg by mouth daily. 09/12/20   [provider]  metFORMIN (GLUCOPHAGE) 1000 MG tablet Take 1 tablet by mouth 2 (two) times daily with a meal. 09/05/13   [provider]  Semaglutide, 2 MG/DOSE, (OZEMPIC, 2 MG/DOSE,) 8 MG/3ML SOPN Inject 2 mg into the skin every Sunday.    [provider]  silver sulfADIAZINE (SILVADENE) 1 % cream Apply 1 application. topically daily as needed (diabetic related sores).    [provider]  spironolactone (ALDACTONE) 25 MG tablet Take 0.5 tablets (12.5 mg total) by mouth 2 (two) times daily. 10/28/13   Dwana Melena, PA-C    Physical Exam: BP (!) 112/59 (BP Location: Right Arm)   Pulse 98   Temp 99.2 F (37.3 C) (Oral)   Resp 19   Ht 5\' 4"  (1.626 m)   Wt 103 kg   LMP 05/19/2012   SpO2 96%   BMI 38.98 kg/m   General: 62  y.o. year-old female well developed well nourished in no acute distress.  Alert and oriented x3. HEENT: NCAT, EOMI Neck: Supple, trachea medial Cardiovascular: Regular rate and rhythm with no rubs or gallops.  No thyromegaly or JVD noted.  No lower extremity edema. 2/4 pulses in all 4 extremities. Respiratory: Clear to auscultation with no wheezes or rales. Good inspiratory effort. Abdomen: Soft, nontender nondistended with normal bowel sounds x4 quadrants. Muskuloskeletal: No cyanosis, clubbing or edema noted bilaterally Neuro: CN II-XII intact, strength 5/5 x 4, sensation, reflexes intact Skin: No ulcerative lesions noted or rashes Psychiatry: Judgement and insight appear normal. Mood is appropriate for condition and setting          Labs on  Admission:  Basic Metabolic Panel: Recent Labs  Lab 04/16/23 0045  NA 133*  K 4.5  CL 103  CO2 21*  GLUCOSE 84  BUN 49*  CREATININE 1.74*  CALCIUM 8.1*   Liver Function Tests: Recent Labs  Lab 04/16/23 0045  AST 46*  ALT 25  ALKPHOS 55  BILITOT 0.5  PROT 6.0*  ALBUMIN 2.8*   No results for input(s): "LIPASE", "AMYLASE" in the last 168 hours. No results for input(s): "AMMONIA" in the last 168 hours. CBC: Recent Labs  Lab 04/16/23 0045  WBC 7.0  HGB 11.4*  HCT 36.2  MCV 86.0  PLT 211   Cardiac Enzymes: No results for input(s): "CKTOTAL", "CKMB", "CKMBINDEX", "TROPONINI" in the last 168 hours.  BNP (last 3 results) Recent Labs    04/16/23 0430  BNP 18.0    ProBNP (last 3 results) No results for input(s): "PROBNP" in the last 8760 hours.  CBG: Recent Labs  Lab 04/16/23 0245 04/16/23 0349 04/16/23 0441 04/16/23 0527 04/16/23 0633  GLUCAP 126* 53* 144* 112* 65*    Radiological Exams on Admission: DG Chest Portable 1 View  Result Date: 04/16/2023 CLINICAL DATA:  Cough and COVID-19 positivity EXAM: PORTABLE CHEST 1 VIEW COMPARISON:  10/22/2013 FINDINGS: Cardiac shadow is mildly prominent but stable. Lungs are  well aerated bilaterally. No focal infiltrate is seen. Mild central vascular congestion is noted. No bony abnormality is noted. IMPRESSION: Mild central vascular congestion without significant edema. Electronically Signed   By: Alcide Clever M.D.   On: 04/16/2023 01:41    EKG: I independently viewed the EKG done and my findings are as followed:-Normal sinus rhythm at rate of 95 bpm  Assessment/Plan Present on Admission:  Uncontrolled type 2 diabetes mellitus with hypoglycemia, without long-term current use of insulin (HCC)  OSA (obstructive sleep apnea)  HTN (hypertension), benign  Principal Problem:   Uncontrolled type 2 diabetes mellitus with hypoglycemia, without long-term current use of insulin (HCC) Active Problems:   HTN (hypertension), benign   OSA (obstructive sleep apnea)   COVID-19 virus infection   AKI (acute kidney injury) (HCC)   Hypoalbuminemia due to protein-calorie malnutrition (HCC)   Obesity (BMI 30-39.9)   Nonischemic cardiomyopathy (HCC)  Type 2 diabetes mellitus with hypoglycemia This may be due to glipizide considering that patient continues to take the medication despite poor oral intake. Continue IV D5 NS with plan to wean patient off base as CBG becomes more stable Continue CBG monitoring Hold home diabetic medications at this time  COVID-19 virus infection Patient has no fever, shortness of breath Continue symptomatic treatment  Hypoalbuminemia possibly secondary to moderate protein calorie malnutrition Albumin 2.8, protein supplement will be provided  Acute kidney injury Creatinine at 1.74 (baseline creatinine at 0.7-0.8) Continue IV hydration Renally adjust medications, avoid nephrotoxic agents/dehydration/hypotension  Obesity (BMI 30.90) Diet and lifestyle modification  Type 2 diabetes mellitus with hypoglycemia This may be due to glipizide considering that patient continues to take the medication despite poor oral intake. Continue IV D5 NS with  plan to wean patient off base as CBG becomes more stable Continue CBG monitoring Hold home diabetic medications at this time  Essential hypertension BP meds will be held at this time due to soft BP  Mixed hyperlipidemia Continue statin  Nonischemic cardiomyopathy Echocardiogram done in March 2015 showed EF of 25-30%, grade 1 diastolic dysfunction.  Catheterization done in 10/2013 showed patent coronaries.  Repeated echocardiogram in July 2015 showed improvement of LVEF at 60-65% per cardiology medical record Resume  home meds once BP improves  Obstructive sleep apnea Continue CPAP  DVT prophylaxis: Lovenox  Advance Care Planning: Full code  Consults: None  Family Communication: Husband at bedside (all questions answered to satisfaction)  Severity of Illness: The appropriate patient status for this patient is OBSERVATION. Observation status is judged to be reasonable and necessary in order to provide the required intensity of service to ensure the patient's safety. The patient's presenting symptoms, physical exam findings, and initial radiographic and laboratory data in the context of their medical condition is felt to place them at decreased risk for further clinical deterioration. Furthermore, it is anticipated that the patient will be medically stable for discharge from the hospital within 2 midnights of admission.   Author: Frankey Shown, DO 04/16/2023 7:04 AM  For on call review [email or web address].

## 2023-04-16 NOTE — Plan of Care (Signed)

## 2023-04-16 NOTE — Progress Notes (Signed)
   04/16/23 0920  TOC Brief Assessment  Insurance and Status Reviewed  Patient has primary care physician Yes  Home environment has been reviewed From home,spouse  Prior level of function: Independent  Prior/Current Home Services No current home services  Social Determinants of Health Reivew SDOH reviewed no interventions necessary  Readmission risk has been reviewed Yes  Transition of care needs no transition of care needs at this time

## 2023-04-16 NOTE — ED Notes (Signed)
XR at bedside

## 2023-04-16 NOTE — ED Provider Notes (Signed)
[place] EMERGENCY DEPARTMENT AT [facility] Provider Note   CSN: [id number] Arrival date & time: 04/16/23  0045     History  Chief Complaint  Patient presents with   COVID Positive   Hypoglycemia   Altered Mental Status    Bridget Little is a 62 y.o. female.  The history is provided by the patient and the spouse.  Patient history of diabetes, hypertension presents for multiple complaints.  Over 2 days ago the patient was diagnosed with COVID-19.  She has had cough and lack of appetite and lack of energy. Tonight the patient's fatigue seemed to worsen and EMS was called.  On EMS arrival patient had a glucose of around 46 that improved with dextrose.  Her mentation also improved. Patient does report increasing shortness of breath and feels like she is wheezing.  Denies history of chronic lung disease and is a non-smoker.  Patient is a diabetic and takes oral diabetic medications including glipizide and Jardiance    Past Medical History:  Diagnosis Date   CHF (congestive heart failure) (HCC)    Chronic back pain    Diabetes mellitus, type II (HCC)    Essential hypertension, benign    Hypercholesteremia    Neuromuscular disorder (HCC)    right foot   Sleep apnea     Home Medications Prior to Admission medications   Medication Sig Start Date End Date Taking? Authorizing Provider  allopurinol (ZYLOPRIM) 100 MG tablet Take 100 mg by mouth 2 (two) times daily. 09/12/20   [provider]  aspirin 81 MG tablet Take 81 mg by mouth daily.    [provider]  atorvastatin (LIPITOR) 40 MG tablet Take 40 mg by mouth daily.    [provider]  Calcium Citrate-Vitamin D (CALCIUM + D PO) Take 1 tablet by mouth daily.    [provider]  carvedilol (COREG) 25 MG tablet Take 1 tablet (25 mg total) by mouth 2 (two) times daily. 02/04/21   Antoine Poche, MD  colchicine 0.6 MG tablet Take 0.6-1.2 mg by mouth See admin instructions. Take  1.2 mg as needed at onset of gout flare may take an additional 0.6 mg an hour later as needed. Do not repeat for 3 days 04/30/15   [provider]  diclofenac Sodium (VOLTAREN) 1 % GEL Apply topically 2 (two) times daily as needed. 09/03/22 09/03/23  [provider]  empagliflozin (JARDIANCE) 25 MG TABS tablet Take 25 mg by mouth daily.    [provider]  EPINEPHrine (EPI-PEN) 0.3 mg/0.3 mL SOAJ injection Inject 0.3 mg into the muscle as needed for anaphylaxis.    [provider]  furosemide (LASIX) 40 MG tablet Take 40 mg by mouth daily. 06/27/20   [provider]  gabapentin (NEURONTIN) 300 MG capsule Take 300 mg by mouth 3 (three) times daily.  02/28/15   [provider]  glipiZIDE (GLUCOTROL) 10 MG tablet Take 10 mg by mouth 2 (two) times daily.    [provider]  Boris Lown Oil 1000 MG CAPS Take 1,000 mg by mouth daily.    [provider]  lisinopril (ZESTRIL) 40 MG tablet Take 40 mg by mouth daily. 09/12/20   [provider]  metFORMIN (GLUCOPHAGE) 1000 MG tablet Take 1 tablet by mouth 2 (two) times daily with a meal. 09/05/13   [provider]  Semaglutide, 2 MG/DOSE, (OZEMPIC, 2 MG/DOSE,) 8 MG/3ML SOPN Inject 2 mg into the skin every Sunday.  [provider]  silver sulfADIAZINE (SILVADENE) 1 % cream Apply 1 application. topically daily as needed (diabetic related sores).    [provider]  spironolactone (ALDACTONE) 25 MG tablet Take 0.5 tablets (12.5 mg total) by mouth 2 (two) times daily. 10/28/13   Dwana Melena, PA-C      Allergies    Bee venom    Review of Systems   Review of Systems  Constitutional:  Positive for fatigue.  Respiratory:  Positive for cough.   Gastrointestinal:  Negative for vomiting.    Physical Exam Updated Vital Signs BP (!) 119/59   Pulse 93   Temp 98.1 F (36.7 C) (Oral)   Resp (!) 22   Ht 1.626 m (5\' 4" )   Wt 102.1 kg   LMP 05/19/2012   SpO2 97%    BMI 38.62 kg/m  Physical Exam CONSTITUTIONAL: Ill-appearing HEAD: Normocephalic/atraumatic EYES: EOMI/PERRL ENMT: Mucous membranes moist, no angioedema or stridor NECK: supple no meningeal signs SPINE/BACK:entire spine nontender CV: S1/S2 noted LUNGS coarse breath sounds bilaterally, tachypnea ABDOMEN: soft, nontender, obese, abdominal hernia noted without tenderness NEURO: Pt is awake/alert/appropriate, moves all extremitiesx4.  No facial droop.  No arm or leg drift EXTREMITIES: Legs are warm to touch, discoloration is noted Pulses found by Doppler Chronic skin changes found SKIN: warm, color normal   ED Results / Procedures / Treatments   Labs (all labs ordered are listed, but only abnormal results are displayed) Labs Reviewed  SARS CORONAVIRUS 2 BY RT PCR - Abnormal; Notable for the following components:      Result Value   SARS Coronavirus 2 by RT PCR POSITIVE (*)    All other components within normal limits  CBC - Abnormal; Notable for the following components:   Hemoglobin 11.4 (*)    RDW 18.2 (*)    All other components within normal limits  COMPREHENSIVE METABOLIC PANEL - Abnormal; Notable for the following components:   Sodium 133 (*)    CO2 21 (*)    BUN 49 (*)    Creatinine, Ser 1.74 (*)    Calcium 8.1 (*)    Total Protein 6.0 (*)    Albumin 2.8 (*)    AST 46 (*)    GFR, Estimated 33 (*)    All other components within normal limits  CBG MONITORING, ED - Abnormal; Notable for the following components:   Glucose-Capillary 46 (*)    All other components within normal limits  CBG MONITORING, ED - Abnormal; Notable for the following components:   Glucose-Capillary 112 (*)    All other components within normal limits  CBG MONITORING, ED - Abnormal; Notable for the following components:   Glucose-Capillary 126 (*)    All other components within normal limits  CBG MONITORING, ED - Abnormal; Notable for the following components:   Glucose-Capillary 53 (*)    All  other components within normal limits  CBG MONITORING, ED - Abnormal; Notable for the following components:   Glucose-Capillary 144 (*)    All other components within normal limits  BRAIN NATRIURETIC PEPTIDE  LACTIC ACID, PLASMA  CBG MONITORING, ED    EKG EKG Interpretation Date/Time:  Saturday April 16 2023 01:22:26 EDT Ventricular Rate:  95 PR Interval:  185 QRS Duration:  104 QT Interval:  349 QTC Calculation: 439 R Axis:   2  Text Interpretation: Sinus rhythm Borderline low voltage, extremity leads Consider anterior infarct Confirmed by Zadie Rhine ([id number]) on 04/16/2023 1:37:06 AM  Radiology DG Chest Portable 1  View  Result Date: 04/16/2023 CLINICAL DATA:  Cough and COVID-19 positivity EXAM: PORTABLE CHEST 1 VIEW COMPARISON:  10/22/2013 FINDINGS: Cardiac shadow is mildly prominent but stable. Lungs are well aerated bilaterally. No focal infiltrate is seen. Mild central vascular congestion is noted. No bony abnormality is noted. IMPRESSION: Mild central vascular congestion without significant edema. Electronically Signed   By: Alcide Clever M.D.   On: 04/16/2023 01:41    Procedures .Critical Care  Performed by: Zadie Rhine, MD Authorized by: Zadie Rhine, MD   Critical care provider statement:    Critical care time (minutes):  60   Critical care start time:  04/16/2023 4:05 AM   Critical care end time:  04/16/2023 5:05 AM   Critical care time was exclusive of:  Separately billable procedures and treating other patients   Critical care was necessary to treat or prevent imminent or life-threatening deterioration of the following conditions:  Metabolic crisis, dehydration, CNS failure or compromise, endocrine crisis and respiratory failure   Critical care was time spent personally by me on the following activities:  Obtaining history from patient or surrogate, examination of patient, development of treatment plan with patient or surrogate, ordering and review of  radiographic studies, ordering and review of laboratory studies, review of old charts, re-evaluation of patient's condition, pulse oximetry, ordering and performing treatments and interventions and evaluation of patient's response to treatment   I assumed direction of critical care for this patient from another provider in my specialty: no     Care discussed with: admitting provider       Medications Ordered in ED Medications  dextrose 50 % solution (50 mLs  Given 04/16/23 0142)  ipratropium-albuterol (DUONEB) 0.5-2.5 (3) MG/3ML nebulizer solution 3 mL (3 mLs Nebulization Given 04/16/23 0422)  dextrose 50 % solution (50 mLs  Given 04/16/23 0354)  dextrose 5 % and 0.45% NaCl 5-0.45 % bolus 250 mL (250 mLs Intravenous New Bag/Given 04/16/23 0407)    ED Course/ Medical Decision Making/ A&P Clinical Course as of 04/16/23 1610  Sat Apr 16, 2023  0505 Patient with history of diabetes, hypertension presents with recent diagnosis of COVID which has led to increasing cough, fatigue and shortness of breath.  She also reports decreased intake.  However she is continue to take her oral hypoglycemics and now having frequent episodes of hypoglycemia.  Patient also noted to have acute kidney injury which is likely contributing to her difficulty clearing her diabetic meds [DW]  0506 Patient will be admitted for glucose control and monitoring.  Patient has any other complaints except cough and mild shortness of breath.  At this time she is on room air but is mildly tachypneic.  She was given nebulized treatments with some improvement. [DW]  9604 Lactic acid is negative, therefore sepsis less likely.  No obvious signs of CHF. [DW]  0507 Discussed with Dr. Thomes Dinning for admission [DW]    Clinical Course User Index [DW] Zadie Rhine, MD                                 Medical Decision Making Amount and/or Complexity of Data Reviewed Labs: ordered. Radiology: ordered. ECG/medicine tests:  ordered.  Risk Prescription drug management. Decision regarding hospitalization.   This patient presents to the ED for concern of altered mental status, this involves an extensive number of treatment options, and is a complaint that carries with it a high risk of complications and morbidity.  The differential diagnosis includes but is not limited to CVA, intracranial hemorrhage, acute coronary syndrome, renal failure, urinary tract infection, electrolyte disturbance, pneumonia, hypoglycemia    Comorbidities that complicate the patient evaluation: Patient's presentation is complicated by their history of obesity, diabetes  Additional history obtained: Additional history obtained from spouse Records reviewed Care Everywhere/External Records  Lab Tests: I Ordered, and personally interpreted labs.  The pertinent results include: Acute kidney injury, hyperglycemia  Imaging Studies ordered: I ordered imaging studies including X-ray chest   I independently visualized and interpreted imaging which showed cardiomegaly I agree with the radiologist interpretation  Cardiac Monitoring: The patient was maintained on a cardiac monitor.  I personally viewed and interpreted the cardiac monitor which showed an underlying rhythm of:  sinus rhythm  Medicines ordered and prescription drug management: I ordered medication including albuterol nebulizer for cough Reevaluation of the patient after these medicines showed that the patient    improved   Critical Interventions:   dextrose for frequent hypoglycemia  Consultations Obtained: I requested consultation with the admitting physician [facility] , and discussed  findings as well as pertinent plan - they recommend: admit  Reevaluation: After the interventions noted above, I reevaluated the patient and found that they have :improved  Complexity of problems addressed: Patient's presentation is most consistent with  acute presentation with potential  threat to life or bodily function  Disposition: After consideration of the diagnostic results and the patient's response to treatment,  I feel that the patent would benefit from admission   .           Final Clinical Impression(s) / ED Diagnoses Final diagnoses:  Hypoglycemia  AKI (acute kidney injury) (HCC)  COVID-19    Rx / DC Orders ED Discharge Orders     None         Zadie Rhine, MD 04/16/23 [phone]

## 2023-04-17 ENCOUNTER — Observation Stay (HOSPITAL_COMMUNITY): Payer: Managed Care, Other (non HMO)

## 2023-04-17 DIAGNOSIS — E11649 Type 2 diabetes mellitus with hypoglycemia without coma: Secondary | ICD-10-CM | POA: Diagnosis not present

## 2023-04-17 DIAGNOSIS — U071 COVID-19: Secondary | ICD-10-CM | POA: Diagnosis not present

## 2023-04-17 DIAGNOSIS — N179 Acute kidney failure, unspecified: Secondary | ICD-10-CM | POA: Diagnosis not present

## 2023-04-17 LAB — COMPREHENSIVE METABOLIC PANEL
ALT: 27 U/L (ref 0–44)
AST: 39 U/L (ref 15–41)
Albumin: 3.1 g/dL — ABNORMAL LOW (ref 3.5–5.0)
Alkaline Phosphatase: 62 U/L (ref 38–126)
Anion gap: 9 (ref 5–15)
BUN: 43 mg/dL — ABNORMAL HIGH (ref 8–23)
CO2: 19 mmol/L — ABNORMAL LOW (ref 22–32)
Calcium: 8.7 mg/dL — ABNORMAL LOW (ref 8.9–10.3)
Chloride: 106 mmol/L (ref 98–111)
Creatinine, Ser: 1.29 mg/dL — ABNORMAL HIGH (ref 0.44–1.00)
GFR, Estimated: 47 mL/min — ABNORMAL LOW (ref >60)
Glucose, Bld: 133 mg/dL — ABNORMAL HIGH (ref 70–99)
Potassium: 4.6 mmol/L (ref 3.5–5.1)
Sodium: 134 mmol/L — ABNORMAL LOW (ref 135–145)
Total Bilirubin: 0.9 mg/dL (ref 0.3–1.2)
Total Protein: 6.9 g/dL (ref 6.5–8.1)

## 2023-04-17 LAB — C-REACTIVE PROTEIN: CRP: 16.4 mg/dL — ABNORMAL HIGH (ref <1.0)

## 2023-04-17 LAB — CBC
HCT: 39.6 % (ref 36.0–46.0)
Hemoglobin: 12.7 g/dL (ref 12.0–15.0)
MCH: 27.3 pg (ref 26.0–34.0)
MCHC: 32.1 g/dL (ref 30.0–36.0)
MCV: 85.2 fL (ref 80.0–100.0)
Platelets: 239 10*3/uL (ref 150–400)
RBC: 4.65 MIL/uL (ref 3.87–5.11)
RDW: 17.9 % — ABNORMAL HIGH (ref 11.5–15.5)
WBC: 6.5 10*3/uL (ref 4.0–10.5)
nRBC: 0 % (ref 0.0–0.2)

## 2023-04-17 LAB — MAGNESIUM: Magnesium: 2.4 mg/dL (ref 1.7–2.4)

## 2023-04-17 LAB — D-DIMER, QUANTITATIVE: D-Dimer, Quant: 1.38 ug{FEU}/mL — ABNORMAL HIGH (ref 0.00–0.50)

## 2023-04-17 LAB — PHOSPHORUS: Phosphorus: 3.2 mg/dL (ref 2.5–4.6)

## 2023-04-17 LAB — GLUCOSE, CAPILLARY
Glucose-Capillary: 126 mg/dL — ABNORMAL HIGH (ref 70–99)
Glucose-Capillary: 127 mg/dL — ABNORMAL HIGH (ref 70–99)
Glucose-Capillary: 149 mg/dL — ABNORMAL HIGH (ref 70–99)
Glucose-Capillary: 144 mg/dL — ABNORMAL HIGH (ref 70–99)
Glucose-Capillary: 149 mg/dL — ABNORMAL HIGH (ref 70–99)

## 2023-04-17 LAB — HIV ANTIBODY (ROUTINE TESTING W REFLEX): HIV Screen 4th Generation wRfx: NONREACTIVE

## 2023-04-17 MED ORDER — SODIUM CHLORIDE 0.9 % IV SOLN: INTRAVENOUS | Status: AC

## 2023-04-17 MED ORDER — IOHEXOL 350 MG/ML SOLN
75.0000 mL | Freq: Once | INTRAVENOUS | Status: AC | PRN
  Administered 2023-04-17: 75 mL via INTRAVENOUS

## 2023-04-17 MED ORDER — CARVEDILOL 3.125 MG PO TABS
3.1250 mg | ORAL_TABLET | Freq: Two times a day (BID) | ORAL | Status: DC
  Administered 2023-04-17 – 2023-04-18 (×3): 3.125 mg via ORAL
  Filled 2023-04-17 (×4): qty 1

## 2023-04-17 MED ORDER — PANTOPRAZOLE SODIUM 40 MG IV SOLR
40.0000 mg | Freq: Two times a day (BID) | INTRAVENOUS | Status: DC
  Administered 2023-04-17 – 2023-04-18 (×2): 40 mg via INTRAVENOUS
  Filled 2023-04-17 (×2): qty 10

## 2023-04-17 MED ORDER — ORAL CARE MOUTH RINSE: 15.0000 mL | OROMUCOSAL | Status: DC | PRN

## 2023-04-17 MED ORDER — NYSTATIN 100000 UNIT/GM EX POWD
Freq: Two times a day (BID) | CUTANEOUS | Status: DC
  Filled 2023-04-17: qty 15

## 2023-04-17 NOTE — Progress Notes (Signed)
I went to the patient room to check her blood glucose. Patient was sitting on the side of the bed with the CPAP mask around her neck, stated, "I don't know how I got all tangled up in this". Assisted patient with removing the mask, stated, " I don't want that back on". Requested to stay seated on the side of the bed. Plan of care ongoing.

## 2023-04-17 NOTE — Progress Notes (Addendum)
PROGRESS NOTE  Bridget Little UEA:[id number] DOB: Dec 28, 1960 DOA: 04/16/2023 PCP: Claretta Fraise, DO  Brief History:  62 year old female with a history of diabetes mellitus type 2, hypertension, hyperlipidemia, OSA, HFimpEF presenting with 2 to 3-day history of generalized weakness, malaise, coughing.  She denied any fevers, chills, headache, neck pain, nausea, vomiting.  She did have some loose stools.  She denies any chest pain or abdominal pain.  There is no hematochezia or melena.  She denies any abdominal pain.  There is no hematuria.  There is no dysuria.  She had a nonproductive cough.  There is no hemoptysis.  She states that her appetite has been poor.  She continues take all her medications including her glipizide and Jardiance.  The patient spouse noted the patient to be a bit confused.  As result, EMS was activated.  EMS noted the patient to have glucose of 46.  The patient was given some dextrose with improvement of her mentation.  She was brought to the emergency department for further evaluation and treatment.  During her stay in the emergency department, the patient was given up to 4 A of D50.  She continued to become hypoglycemic.  As result she was admitted for further evaluation and treatment. In the ED, the patient was afebrile and hemodynamically stable albeit with soft blood pressures.  Oxygen saturation is 96% on room air.  WBC 7.0, hemoglobin 11.4, platelets 211,000.  Sodium 133, potassium 4.5, bicarbonate 21, serum creatinine 1.74.  AST 46, ALT 25, alkaline phosphatase 55, total bilirubin 0.5.  Chest x-ray showed increased interstitial markings.  There is poor inspiration.  COVID-19 PCR was positive.   Assessment/Plan: Generalized weakness/COVID-19 pneumonia -Secondary to COVID-19 infection/pneumonia -Stable on room air -Check CRP 12.3>>16.4 -Check D-dimer-1.06>>1.25>>1.38 -Check LDH--225 -Serum B12--954 -TSH--1.851 -Folic acid--13.0 -UA--neg for pyuria -PT  eval--pt refused 04/16/23 -CTA chest--negative PE;    Acute kidney injury -Secondary to volume depletion -Baseline creatinine 0.8-1.0 -Patient presented with serum creatinine is 1.74 -Restarted IV fluidsMultifocal patchy areas of airspace disease throughout both lungs  -serum creatinine improved to 1.29    Hyponatremia -Secondary to volume depletion and poor solute intake -Restart IV fluids>>improving   Diabetes mellitus type 2, with hypoglycemia -03/21/2023 hemoglobin A1c 6.5 -Holding glipizide and Jardiance -Had to be given four doses of D50 -monitor CBGs -hypoglycemia improved without any further episodes x 24 hours -restart home ozempic at time of dc -d/c glipizide and follow up with PCP   Class II obesity -BMI 38.98 -Lifestsyle modification   Chronic HFimpEF -10/23/2013 echo EF 25-30%, grade 1 DD -02/21/14 echo EF 60 to 65%, no WMA -clinically dry   Essential HTN -holding lisinopril due to AKI--continue to hold  -hold coreg due to soft BPs>>restart lower dose -would recheck BMP and BPs in one week after d/c and if continues to improve>>plan to restart -holding spironolactone>>restart at d/c    Family Communication:   son updated at bedside 8/25  Consultants:  none  Code Status:  FULL   DVT Prophylaxis:  [place] Lovenox   Procedures: As Listed in Progress Note Above  Antibiotics: None        Subjective: Pt states appetite remains poor.  Denies vomiting.  Had one loose stool without blood.  Denies f/c, cp, abd pain  Objective: Vitals:   04/17/23 0513 04/17/23 0521 04/17/23 1235 04/17/23 1410  BP: (!) 108/59  (!) 103/59 131/69  Pulse: (!) 110 98 (!) 102 99  Resp:  19  18 20   Temp: 99.7 F (37.6 C)  98.7 F (37.1 C) 98.6 F (37 C)  TempSrc: Oral  Oral Axillary  SpO2: 91%  95% 95%  Weight:      Height:        Intake/Output Summary (Last 24 hours) at 04/17/2023 1643 Last data filed at 04/17/2023 0356 Gross per 24 hour  Intake 360 ml  Output --  Net  360 ml   Weight change:  Exam:  General:  Pt is alert, follows commands appropriately, not in acute distress HEENT: No icterus, No thrush, No neck mass, [place]/AT Cardiovascular: RRR, S1/S2, no rubs, no gallops Respiratory: bibasilar rales.  No wheeze Abdomen: Soft/+BS, non tender, non distended, no guarding Extremities: trace nonpitting edema, No lymphangitis, No petechiae, No rashes, no synovitis   Data Reviewed: I have personally reviewed following labs and imaging studies Basic Metabolic Panel: Recent Labs  Lab 04/16/23 0045 04/17/23 0437  NA 133* 134*  K 4.5 4.6  CL 103 106  CO2 21* 19*  GLUCOSE 84 133*  BUN 49* 43*  CREATININE 1.74* 1.29*  CALCIUM 8.1* 8.7*  MG  --  2.4  PHOS  --  3.2   Liver Function Tests: Recent Labs  Lab 04/16/23 0045 04/17/23 0437  AST 46* 39  ALT 25 27  ALKPHOS 55 62  BILITOT 0.5 0.9  PROT 6.0* 6.9  ALBUMIN 2.8* 3.1*   No results for input(s): "LIPASE", "AMYLASE" in the last 168 hours. No results for input(s): "AMMONIA" in the last 168 hours. Coagulation Profile: No results for input(s): "INR", "PROTIME" in the last 168 hours. CBC: Recent Labs  Lab 04/16/23 0045 04/17/23 0437  WBC 7.0 6.5  HGB 11.4* 12.7  HCT 36.2 39.6  MCV 86.0 85.2  PLT 211 239   Cardiac Enzymes: No results for input(s): "CKTOTAL", "CKMB", "CKMBINDEX", "TROPONINI" in the last 168 hours. BNP: Invalid input(s): "POCBNP" CBG: Recent Labs  Lab 04/16/23 1948 04/17/23 0144 04/17/23 0526 04/17/23 0757 04/17/23 1523  GLUCAP 138* 126* 127* 149* 144*   HbA1C: No results for input(s): "HGBA1C" in the last 72 hours. Urine analysis:    Component Value Date/Time   COLORURINE YELLOW 04/16/2023 1256   APPEARANCEUR CLEAR 04/16/2023 1256   LABSPEC 1.006 04/16/2023 1256   PHURINE 5.0 04/16/2023 1256   GLUCOSEU 150 (A) 04/16/2023 1256   HGBUR MODERATE (A) 04/16/2023 1256   BILIRUBINUR NEGATIVE 04/16/2023 1256   KETONESUR NEGATIVE 04/16/2023 1256   PROTEINUR  NEGATIVE 04/16/2023 1256   NITRITE NEGATIVE 04/16/2023 1256   LEUKOCYTESUR TRACE (A) 04/16/2023 1256   Sepsis Labs: @LABRCNTIP (procalcitonin:4,lacticidven:4) ) Recent Results (from the past 240 hour(s))  SARS Coronavirus 2 by RT PCR (hospital order, performed in [facility] hospital lab) *cepheid single result test* Anterior Nasal Swab     Status: Abnormal   Collection Time: 04/16/23  3:50 AM   Specimen: Anterior Nasal Swab  Result Value Ref Range Status   SARS Coronavirus 2 by RT PCR POSITIVE (A) NEGATIVE Final    Comment: (NOTE) SARS-CoV-2 target nucleic acids are DETECTED  SARS-CoV-2 RNA is generally detectable in upper respiratory specimens  during the acute phase of infection.  Positive results are indicative  of the presence of the identified virus, but do not rule out bacterial infection or co-infection with other pathogens not detected by the test.  Clinical correlation with patient history and  other diagnostic information is necessary to determine patient infection status.  The expected result is negative.  Fact Sheet for  Patients:   RoadLapTop.co.za   Fact Sheet for Healthcare Providers:   http://kim-miller.com/    This test is not yet approved or cleared by the [place] FDA and  has been authorized for detection and/or diagnosis of SARS-CoV-2 by FDA under an Emergency Use Authorization (EUA).  This EUA will remain in effect (meaning this test can be used) for the duration of  the COVID-19 declaration under Section 564(b)(1)  of the Act, 21 U.S.C. section 360-bbb-3(b)(1), unless the authorization is terminated or revoked sooner.   Performed at [facility], [address] [place], [place] [address]      Scheduled Meds:  atorvastatin  40 mg Oral QHS   carvedilol  3.125 mg Oral BID WC   enoxaparin (LOVENOX) injection  40 mg Subcutaneous Daily   feeding supplement (GLUCERNA SHAKE)  237 mL Oral TID BM   nystatin    Topical BID   Continuous Infusions:  Procedures/Studies: CT Angio Chest Pulmonary Embolism (PE) W or WO Contrast  Result Date: 04/17/2023 CLINICAL DATA:  Dyspnea. Cough. Covid-19 infection. Positive D-dimer. EXAM: CT ANGIOGRAPHY CHEST WITH CONTRAST TECHNIQUE: Multidetector CT imaging of the chest was performed using the standard protocol during bolus administration of intravenous contrast. Multiplanar CT image reconstructions and MIPs were obtained to evaluate the vascular anatomy. RADIATION DOSE REDUCTION: This exam was performed according to the departmental dose-optimization program which includes automated exposure control, adjustment of the mA and/or kV according to patient size and/or use of iterative reconstruction technique. CONTRAST:  75mL OMNIPAQUE IOHEXOL 350 MG/ML SOLN COMPARISON:  10/22/2013 FINDINGS: Cardiovascular: Satisfactory opacification of pulmonary arteries noted, and no pulmonary emboli identified. No evidence of thoracic aortic dissection or aneurysm. Mediastinum/Nodes: Prominent goiter with substernal extension again noted. No pathologically enlarged lymph nodes identified. Lungs/Pleura: Multifocal patchy areas of airspace disease again seen throughout both lungs, with mild improvement since prior study. Consistent with inflammatory or infectious etiology. Masses identified. No No evidence of pleural effusion. Upper abdomen: No acute findings. Musculoskeletal: No suspicious bone lesions identified. Review of the MIP images confirms the above findings. IMPRESSION: No evidence of pulmonary embolism. Multifocal patchy areas of airspace disease throughout both lungs, with mild improvement since prior study. Differential diagnosis includes inflammatory or infectious etiologies, pulmonary edema, or hemorrhage. Stable goiter with substernal extension. Electronically Signed   By: Danae Orleans M.D.   On: 04/17/2023 16:01   DG Chest Portable 1 View  Result Date: 04/16/2023 CLINICAL DATA:   Cough and COVID-19 positivity EXAM: PORTABLE CHEST 1 VIEW COMPARISON:  10/22/2013 FINDINGS: Cardiac shadow is mildly prominent but stable. Lungs are well aerated bilaterally. No focal infiltrate is seen. Mild central vascular congestion is noted. No bony abnormality is noted. IMPRESSION: Mild central vascular congestion without significant edema. Electronically Signed   By: Alcide Clever M.D.   On: 04/16/2023 01:41    Catarina Hartshorn, DO  [facility]  If 7PM-7AM, please contact night-coverage [email or web address] Password [facility] 04/17/2023, 4:43 PM   LOS: 0 days

## 2023-04-17 NOTE — Progress Notes (Signed)
Called to patients room, upon entering the room. Patient was holding her IV catheter in her hand, stated, " this came out, I don't know how it came out". Site clean, catheter intact. RN in ICU called for ultrasound IV access to be restarted. Plan of care ongoing.

## 2023-04-17 NOTE — Plan of Care (Signed)

## 2023-04-17 NOTE — Progress Notes (Signed)
Nurse tech went to the patient's room because she called the nurses station for help. Patient was on the window side of the bed attempting to move the recliner, unlocked the bed and was trying to push the recliner while it was locked. Patient was dyspneic. Assisted back with sitting in the recliner.

## 2023-04-17 NOTE — Discharge Summary (Incomplete)
Physician Discharge Summary   Patient: Bridget Little MRN: [id number] DOB: Aug 30, 1960  Admit date:     04/16/2023  Discharge date: 04/17/23  Discharge Physician: Onalee Hua Scotty Pinder   PCP: Claretta Fraise, DO   Recommendations at discharge:   Please follow up with primary care provider within 1-2 weeks  Please repeat BMP and CBC in one week      Hospital Course: 62 year old female with a history of diabetes mellitus type 2, hypertension, hyperlipidemia, OSA, HFimpEF presenting with 2 to 3-day history of generalized weakness, malaise, coughing.  She denied any fevers, chills, headache, neck pain, nausea, vomiting.  She did have some loose stools.  She denies any chest pain or abdominal pain.  There is no hematochezia or melena.  She denies any abdominal pain.  There is no hematuria.  There is no dysuria.  She had a nonproductive cough.  There is no hemoptysis.  She states that her appetite has been poor.  She continues take all her medications including her glipizide and Jardiance.  The patient spouse noted the patient to be a bit confused.  As result, EMS was activated.  EMS noted the patient to have glucose of 46.  The patient was given some dextrose with improvement of her mentation.  She was brought to the emergency department for further evaluation and treatment.  During her stay in the emergency department, the patient was given up to 4 A of D50.  She continued to become hypoglycemic.  As result she was admitted for further evaluation and treatment. In the ED, the patient was afebrile and hemodynamically stable albeit with soft blood pressures.  Oxygen saturation is 96% on room air.  WBC 7.0, hemoglobin 11.4, platelets 211,000.  Sodium 133, potassium 4.5, bicarbonate 21, serum creatinine 1.74.  AST 46, ALT 25, alkaline phosphatase 55, total bilirubin 0.5.  Chest x-ray showed increased interstitial markings.  There is poor inspiration.  COVID-19 PCR was positive.  Assessment and Plan: Generalized  weakness/COVID-19 infection -Secondary to COVID-19 infection/pneumonia -Stable on room air -Check CRP 12.3 -Check D-dimer-1.06>>1.25 -Check LDH--225 -Serum B12--954 -TSH--1.851 -Folic acid--13.0 -UA--neg for pyuria -PT eval--pt refused   Acute kidney injury -Secondary to volume depletion -Baseline creatinine 0.8-1.0 -Patient presented with serum creatinine is 1.74 -Restarted IV fluids -serum creatinine improved to 1.29 on day of dc   Hyponatremia -Secondary to volume depletion and poor solute intake -Restart IV fluids>>improving   Diabetes mellitus type 2, with hypoglycemia -03/21/2023 hemoglobin A1c 6.5 -Holding glipizide and Jardiance -Had to be given four doses of D50 -monitor CBGs -hypoglycemia improved without any further episodes x 24 hours -restart home ozempic -d/c glipizide and follow up with PCP   Class II obesity -BMI 38.98 -Lifestsyle modification   Chronic HFimpEF -10/23/2013 echo EF 25-30%, grade 1 DD -7-15 echo EF 60 to 65%, no WMA -clinically dry   Essential HTN -holding lisinopril due to AKI--continue to hold  -would recheck BMP and BPs in one week after d/c and if continues to improve>>plan to restart -holding spironolactone>>restart at d/c     {Tip this will not be part of the note when signed Body mass index is 38.98 kg/m. , ,  (Optional):26781}   Consultants: none Procedures performed: none  Disposition: Home Diet recommendation:  Carb modified diet DISCHARGE MEDICATION: Allergies as of 04/17/2023       Reactions   Bee Venom Anaphylaxis, Hives     Med Rec must be completed prior to using this [facility]***       Discharge  Exam: Filed Weights   04/16/23 0103 04/16/23 0600  Weight: 102.1 kg 103 kg   HEENT:  [place]/AT, No thrush, no icterus CV:  RRR, no rub, no S3, no S4 Lung:  bibasilar crackles.  No wheeze Abd:  soft/+BS, NT Ext:  No edema, no lymphangitis, no synovitis, no rash   Condition at discharge: stable  The  results of significant diagnostics from this hospitalization (including imaging, microbiology, ancillary and laboratory) are listed below for reference.   Imaging Studies: DG Chest Portable 1 View  Result Date: 04/16/2023 CLINICAL DATA:  Cough and COVID-19 positivity EXAM: PORTABLE CHEST 1 VIEW COMPARISON:  10/22/2013 FINDINGS: Cardiac shadow is mildly prominent but stable. Lungs are well aerated bilaterally. No focal infiltrate is seen. Mild central vascular congestion is noted. No bony abnormality is noted. IMPRESSION: Mild central vascular congestion without significant edema. Electronically Signed   By: Alcide Clever M.D.   On: 04/16/2023 01:41    Microbiology: Results for orders placed or performed during the hospital encounter of 04/16/23  SARS Coronavirus 2 by RT PCR (hospital order, performed in [facility] hospital lab) *cepheid single result test* Anterior Nasal Swab     Status: Abnormal   Collection Time: 04/16/23  3:50 AM   Specimen: Anterior Nasal Swab  Result Value Ref Range Status   SARS Coronavirus 2 by RT PCR POSITIVE (A) NEGATIVE Final    Comment: (NOTE) SARS-CoV-2 target nucleic acids are DETECTED  SARS-CoV-2 RNA is generally detectable in upper respiratory specimens  during the acute phase of infection.  Positive results are indicative  of the presence of the identified virus, but do not rule out bacterial infection or co-infection with other pathogens not detected by the test.  Clinical correlation with patient history and  other diagnostic information is necessary to determine patient infection status.  The expected result is negative.  Fact Sheet for Patients:   RoadLapTop.co.za   Fact Sheet for Healthcare Providers:   http://kim-miller.com/    This test is not yet approved or cleared by the [place] FDA and  has been authorized for detection and/or diagnosis of SARS-CoV-2 by FDA under an Emergency Use  Authorization (EUA).  This EUA will remain in effect (meaning this test can be used) for the duration of  the COVID-19 declaration under Section 564(b)(1)  of the Act, 21 U.S.C. section 360-bbb-3(b)(1), unless the authorization is terminated or revoked sooner.   Performed at [facility], [address], [place], [place] [address]     Labs: CBC: Recent Labs  Lab 04/16/23 0045 04/17/23 0437  WBC 7.0 6.5  HGB 11.4* 12.7  HCT 36.2 39.6  MCV 86.0 85.2  PLT 211 239   Basic Metabolic Panel: Recent Labs  Lab 04/16/23 0045 04/17/23 0437  NA 133* 134*  K 4.5 4.6  CL 103 106  CO2 21* 19*  GLUCOSE 84 133*  BUN 49* 43*  CREATININE 1.74* 1.29*  CALCIUM 8.1* 8.7*  MG  --  2.4  PHOS  --  3.2   Liver Function Tests: Recent Labs  Lab 04/16/23 0045 04/17/23 0437  AST 46* 39  ALT 25 27  ALKPHOS 55 62  BILITOT 0.5 0.9  PROT 6.0* 6.9  ALBUMIN 2.8* 3.1*   CBG: Recent Labs  Lab 04/16/23 1637 04/16/23 1948 04/17/23 0144 04/17/23 0526 04/17/23 0757  GLUCAP 98 138* 126* 127* 149*    Discharge time spent: greater than 30 minutes.  Signed: Catarina Hartshorn, MD [facility] 04/17/2023

## 2023-04-18 DIAGNOSIS — U071 COVID-19: Secondary | ICD-10-CM | POA: Diagnosis not present

## 2023-04-18 DIAGNOSIS — N179 Acute kidney failure, unspecified: Secondary | ICD-10-CM | POA: Diagnosis not present

## 2023-04-18 DIAGNOSIS — R531 Weakness: Secondary | ICD-10-CM

## 2023-04-18 DIAGNOSIS — E11649 Type 2 diabetes mellitus with hypoglycemia without coma: Secondary | ICD-10-CM | POA: Diagnosis not present

## 2023-04-18 LAB — PROCALCITONIN: Procalcitonin: 0.28 ng/mL

## 2023-04-18 LAB — BASIC METABOLIC PANEL
Anion gap: 10 (ref 5–15)
BUN: 37 mg/dL — ABNORMAL HIGH (ref 8–23)
CO2: 16 mmol/L — ABNORMAL LOW (ref 22–32)
Calcium: 8.5 mg/dL — ABNORMAL LOW (ref 8.9–10.3)
Chloride: 111 mmol/L (ref 98–111)
Creatinine, Ser: 1.12 mg/dL — ABNORMAL HIGH (ref 0.44–1.00)
GFR, Estimated: 55 mL/min — ABNORMAL LOW (ref >60)
Glucose, Bld: 118 mg/dL — ABNORMAL HIGH (ref 70–99)
Potassium: 4.7 mmol/L (ref 3.5–5.1)
Sodium: 137 mmol/L (ref 135–145)

## 2023-04-18 LAB — GLUCOSE, CAPILLARY
Glucose-Capillary: 123 mg/dL — ABNORMAL HIGH (ref 70–99)
Glucose-Capillary: 127 mg/dL — ABNORMAL HIGH (ref 70–99)
Glucose-Capillary: 135 mg/dL — ABNORMAL HIGH (ref 70–99)
Glucose-Capillary: 136 mg/dL — ABNORMAL HIGH (ref 70–99)

## 2023-04-18 LAB — BRAIN NATRIURETIC PEPTIDE: B Natriuretic Peptide: 19 pg/mL (ref 0.0–100.0)

## 2023-04-18 LAB — C-REACTIVE PROTEIN: CRP: 15.9 mg/dL — ABNORMAL HIGH (ref <1.0)

## 2023-04-18 LAB — D-DIMER, QUANTITATIVE: D-Dimer, Quant: 1.31 ug{FEU}/mL — ABNORMAL HIGH (ref 0.00–0.50)

## 2023-04-18 MED ORDER — GUAIFENESIN-DM 100-10 MG/5ML PO SYRP
5.0000 mL | ORAL_SOLUTION | ORAL | Status: DC | PRN
  Administered 2023-04-18: 5 mL via ORAL
  Filled 2023-04-18: qty 5

## 2023-04-18 MED ORDER — CARVEDILOL 3.125 MG PO TABS: 3.1250 mg | ORAL_TABLET | Freq: Two times a day (BID) | ORAL | 1 refills | Status: DC

## 2023-04-18 NOTE — Plan of Care (Signed)

## 2023-04-18 NOTE — Progress Notes (Signed)
Patient could not wear CPAP as she had a Panic attack.removed soon after being placed on CPAP

## 2023-04-18 NOTE — Evaluation (Signed)
Physical Therapy Evaluation Patient Details Name: Bridget Little MRN: [id number] DOB: 06-05-1961 Today's Date: 04/18/2023  History of Present Illness  Bridget Little is a 62 y.o. female with medical history significant of T2DM, hyperlipidemia, hypertension, nonischemic cardiomyopathy, obstructive sleep apnea presents to the emergency department from home via EMS due to altered mental status and low level of blood glucose at 46.  Patient was diagnosed with COVID-19 virus about 2 days ago and she endorsed decreased appetite and lack of energy.  She presented with altered mental status at home tonight and EMS was activated.  On arrival of EMS team, blood glucose was noted to be at 46, D50 was given with improvement in patient's mentation.  Patient states that she continues to be compliant with diabetic medications including Jardiance and glipizide despite decreased appetite.  Patient was accompanied by her husband   Clinical Impression  Patient demonstrates good return for getting into/out of bed, slow labored movement during transfers and taking steps without out requiring frequent leaning on nearby objects for support, safer using RW with good return for use demonstrated  and tolerated sitting up in chair after therapy.  PLAN:  Patient to be discharged home today and discharged from acute physical therapy to care of nursing for ambulation as tolerated for length of stay with recommendations stated below       If plan is discharge home, recommend the following: A little help with walking and/or transfers;A little help with bathing/dressing/bathroom;Assistance with cooking/housework;Help with stairs or ramp for entrance   Can travel by private vehicle        Equipment Recommendations None recommended by PT  Recommendations for Other Services       Functional Status Assessment Patient has had a recent decline in their functional status and demonstrates the ability to make significant  improvements in function in a reasonable and predictable amount of time.     Precautions / Restrictions Precautions Precautions: Fall Restrictions Weight Bearing Restrictions: No      Mobility  Bed Mobility Overal bed mobility: Modified Independent                  Transfers Overall transfer level: Needs assistance   Transfers: Sit to/from Stand, Bed to chair/wheelchair/BSC Sit to Stand: Supervision, Contact guard assist   Step pivot transfers: Supervision, Contact guard assist       General transfer comment: labored movement with frequent leaning on nearby objects for support, safer using RW    Ambulation/Gait Ambulation/Gait assistance: Supervision, Contact guard assist Gait Distance (Feet): 18 Feet Assistive device: Rolling walker (2 wheels) Gait Pattern/deviations: Decreased step length - right, Decreased step length - left, Decreased stride length, Wide base of support Gait velocity: decreased     General Gait Details: slow labored unsteady cadence with occasional leaning on nearby objects for support, safer using RW with good return for use demonstrated  Stairs            Wheelchair Mobility     Tilt Bed    Modified Rankin (Stroke Patients Only)       Balance Overall balance assessment: Needs assistance Sitting-balance support: Feet supported, No upper extremity supported Sitting balance-Leahy Scale: Good Sitting balance - Comments: seated at EOB   Standing balance support: During functional activity, No upper extremity supported Standing balance-Leahy Scale: Poor Standing balance comment: fair/poor without AD, fair/good using RW  Pertinent Vitals/Pain Pain Assessment Pain Assessment: Faces Faces Pain Scale: Hurts a little bit Pain Location: low back Pain Descriptors / Indicators: Aching, Sore Pain Intervention(s): Limited activity within patient's tolerance, Monitored during session,  Repositioned    Home Living Family/patient expects to be discharged to:: Private residence Living Arrangements: Spouse/significant other Available Help at Discharge: Family;Available PRN/intermittently Type of Home: Mobile home Home Access: Level entry       Home Layout: One level Home Equipment: Agricultural consultant (2 wheels);Cane - single point;BSC/3in1;Grab bars - tub/shower      Prior Function Prior Level of Function : Independent/Modified Independent             Mobility Comments: household and short distanced community ambulator using [facility] for longer distances, has occasionally lean on furniture in home, drives ADLs Comments: Independent for most household AD's, assisted by family     Extremity/Trunk Assessment   Upper Extremity Assessment Upper Extremity Assessment: Overall WFL for tasks assessed    Lower Extremity Assessment Lower Extremity Assessment: Generalized weakness    Cervical / Trunk Assessment Cervical / Trunk Assessment: Normal  Communication   Communication Communication: No apparent difficulties  Cognition Arousal: Alert Behavior During Therapy: WFL for tasks assessed/performed Overall Cognitive Status: Within Functional Limits for tasks assessed                                          General Comments      Exercises     Assessment/Plan    PT Assessment All further PT needs can be met in the next venue of care  PT Problem List Decreased strength;Decreased activity tolerance;Decreased balance;Decreased mobility       PT Treatment Interventions      PT Goals (Current goals can be found in the Care Plan section)  Acute Rehab PT Goals Patient Stated Goal: return home with family to assist and increase strength in OPPT clinic PT Goal Formulation: With patient Time For Goal Achievement: 04/18/23 Potential to Achieve Goals: Good    Frequency       Co-evaluation               AM-PAC PT "6 Clicks" Mobility   Outcome Measure Help needed turning from your back to your side while in a flat bed without using bedrails?: None Help needed moving from lying on your back to sitting on the side of a flat bed without using bedrails?: None Help needed moving to and from a bed to a chair (including a wheelchair)?: A Little Help needed standing up from a chair using your arms (e.g., wheelchair or bedside chair)?: A Little Help needed to walk in hospital room?: A Little Help needed climbing 3-5 steps with a railing? : A Lot 6 Click Score: 19    End of Session   Activity Tolerance: Patient tolerated treatment well;Patient limited by fatigue Patient left: in chair;with call bell/phone within reach Nurse Communication: Mobility status PT Visit Diagnosis: Unsteadiness on feet (R26.81);Other abnormalities of gait and mobility (R26.89);Muscle weakness (generalized) (M62.81)    Time: 4401-0272 PT Time Calculation (min) (ACUTE ONLY): 21 min   Charges:   PT Evaluation $PT Eval Moderate Complexity: 1 Mod PT Treatments $Therapeutic Activity: 8-22 mins PT General Charges $$ ACUTE PT VISIT: 1 Visit         1:46 PM, 04/18/23 Ocie Bob, MPT Physical Therapist with [facility] 336 [phone]  office [phone] mobile phone

## 2023-04-18 NOTE — Discharge Summary (Signed)
Physician Discharge Summary   Patient: Bridget Little MRN: [id number] DOB: 1961-07-21  Admit date:     04/16/2023  Discharge date: 04/18/23  Discharge Physician: Onalee Hua Lasharn Bufkin   PCP: Claretta Fraise, DO   Recommendations at discharge:   Please follow up with primary care provider within 1-2 weeks  Please repeat BMP and CBC in one week     Hospital Course: 62 year old female with a history of diabetes mellitus type 2, hypertension, hyperlipidemia, OSA, HFimpEF presenting with 2 to 3-day history of generalized weakness, malaise, coughing.  She denied any fevers, chills, headache, neck pain, nausea, vomiting.  She did have some loose stools.  She denies any chest pain or abdominal pain.  There is no hematochezia or melena.  She denies any abdominal pain.  There is no hematuria.  There is no dysuria.  She had a nonproductive cough.  There is no hemoptysis.  She states that her appetite has been poor.  She continues take all her medications including her glipizide and Jardiance.  The patient spouse noted the patient to be a bit confused.  As result, EMS was activated.  EMS noted the patient to have glucose of 46.  The patient was given some dextrose with improvement of her mentation.  She was brought to the emergency department for further evaluation and treatment.  During her stay in the emergency department, the patient was given up to 4 A of D50.  She continued to become hypoglycemic.  As result she was admitted for further evaluation and treatment. In the ED, the patient was afebrile and hemodynamically stable albeit with soft blood pressures.  Oxygen saturation is 96% on room air.  WBC 7.0, hemoglobin 11.4, platelets 211,000.  Sodium 133, potassium 4.5, bicarbonate 21, serum creatinine 1.74.  AST 46, ALT 25, alkaline phosphatase 55, total bilirubin 0.5.  Chest x-ray showed increased interstitial markings.  There is poor inspiration.  COVID-19 PCR was positive.  Assessment and Plan: Generalized  weakness/COVID-19 pneumonia -Secondary to COVID-19 infection/pneumonia -Stable on room air -Check CRP 12.3>>16.4 -Check D-dimer-1.06>>1.25>>1.38 -Check LDH--225 -Serum B12--954 -TSH--1.851 -Folic acid--13.0 -UA--neg for pyuria -PT eval--pt refused 04/16/23 -CTA chest--negative PE; Multifocal patchy areas of airspace disease throughout both lungs  -PCT 0.28   Acute kidney injury -Secondary to volume depletion -Baseline creatinine 0.8-1.0 -Patient presented with serum creatinine is 1.74 -Restarted IV fluids -serum creatinine improved to 1.12    Hyponatremia -Secondary to volume depletion and poor solute intake -Restart IV fluids>>improving   Diabetes mellitus type 2, with hypoglycemia -03/21/2023 hemoglobin A1c 6.5 -Holding glipizide and Jardiance -Had to be given four doses of D50 -monitor CBGs -hypoglycemia improved without any further episodes x 24 hours -restart home ozempic at time of dc -d/c glipizide and follow up with PCP   Class II obesity -BMI 38.98 -Lifestsyle modification   Chronic HFimpEF -10/23/2013 echo EF 25-30%, grade 1 DD -02/21/14 echo EF 60 to 65%, no WMA -clinically dry   Essential HTN -holding lisinopril due to AKI--continue to hold  -hold coreg due to soft BPs>>restart at lower dose 6.25 mg bid -would recheck BMP and BPs in one week after d/c and if continues to improve>>plan to restart -holding spironolactone>>restart at d/c        Consultants: none Procedures performed: none  Disposition: Home Diet recommendation:  Carb modified diet DISCHARGE MEDICATION: Allergies as of 04/18/2023       Reactions   Bee Venom Anaphylaxis, Hives        Medication List  STOP taking these medications    glipiZIDE 10 MG tablet Commonly known as: GLUCOTROL   lisinopril 40 MG tablet Commonly known as: ZESTRIL       TAKE these medications    allopurinol 100 MG tablet Commonly known as: ZYLOPRIM Take 100 mg by mouth 2 (two) times daily.    aspirin 81 MG tablet Take 81 mg by mouth daily.   atorvastatin 40 MG tablet Commonly known as: LIPITOR Take 40 mg by mouth daily.   Calcium High Potency/Vitamin D 600-5 MG-MCG Tabs Generic drug: Calcium Carb-Cholecalciferol Take 600 mg by mouth daily.   carvedilol 3.125 MG tablet Commonly known as: COREG Take 1 tablet (3.125 mg total) by mouth 2 (two) times daily with a meal. What changed:  medication strength how much to take when to take this   clobetasol 0.05 % external solution Commonly known as: TEMOVATE Apply 1 Application topically 2 (two) times daily. Apply to scalp two times daily for 2 weeks then stop for 2 weeks, repeat cycle.   colchicine 0.6 MG tablet Take 0.6-1.2 mg by mouth See admin instructions. Take 1.2 mg as needed at onset of gout flare may take an additional 0.6 mg an hour later as needed. Do not repeat for 3 days   diclofenac Sodium 1 % Gel Commonly known as: VOLTAREN Apply topically 2 (two) times daily as needed.   EPINEPHrine 0.3 mg/0.3 mL Soaj injection Commonly known as: EPI-PEN Inject 0.3 mg into the muscle as needed for anaphylaxis.   furosemide 40 MG tablet Commonly known as: LASIX Take 40 mg by mouth daily.   gabapentin 300 MG capsule Commonly known as: NEURONTIN Take 300 mg by mouth 3 (three) times daily.   Jardiance 25 MG Tabs tablet Generic drug: empagliflozin Take 25 mg by mouth daily.   Krill Oil 1000 MG Caps Take 1,000 mg by mouth daily.   metFORMIN 1000 MG tablet Commonly known as: GLUCOPHAGE Take 1 tablet by mouth 2 (two) times daily with a meal.   moxifloxacin 0.5 % ophthalmic solution Commonly known as: VIGAMOX Place 1 drop into the left eye in the morning, at noon, in the evening, and at bedtime.   Ozempic (2 MG/DOSE) 8 MG/3ML Sopn Generic drug: Semaglutide (2 MG/DOSE) Inject 2 mg into the skin every Sunday.   prednisoLONE acetate 1 % ophthalmic suspension Commonly known as: PRED FORTE Place 1 drop into the left  eye 4 (four) times daily.   PreviDent 5000 Dry Mouth 1.1 % Gel dental gel Generic drug: sodium fluoride Place 1 Application onto teeth at bedtime.   silver sulfADIAZINE 1 % cream Commonly known as: SILVADENE Apply 1 application. topically daily as needed (diabetic related sores).   spironolactone 25 MG tablet Commonly known as: ALDACTONE Take 0.5 tablets (12.5 mg total) by mouth 2 (two) times daily.   topiramate 25 MG tablet Commonly known as: TOPAMAX Take 25 mg by mouth at bedtime.   traZODone 50 MG tablet Commonly known as: DESYREL Take 25-50 mg by mouth at bedtime as needed for sleep (insomnia).   tretinoin 0.025 % cream Commonly known as: RETIN-A Apply 1 Application topically at bedtime. Pea-sized amount to entire face at bedtime.        Discharge Exam: Filed Weights   04/16/23 0103 04/16/23 0600  Weight: 102.1 kg 103 kg   HEENT:  [place]/AT, No thrush, no icterus CV:  RRR, no rub, no S3, no S4 Lung:  bibasilar rales.  No wheeze Abd:  soft/+BS, NT Ext:  trace  nonpitting LE edema, no lymphangitis, no synovitis, no rash   Condition at discharge: stable  The results of significant diagnostics from this hospitalization (including imaging, microbiology, ancillary and laboratory) are listed below for reference.   Imaging Studies: CT Angio Chest Pulmonary Embolism (PE) W or WO Contrast  Result Date: 04/17/2023 CLINICAL DATA:  Dyspnea. Cough. Covid-19 infection. Positive D-dimer. EXAM: CT ANGIOGRAPHY CHEST WITH CONTRAST TECHNIQUE: Multidetector CT imaging of the chest was performed using the standard protocol during bolus administration of intravenous contrast. Multiplanar CT image reconstructions and MIPs were obtained to evaluate the vascular anatomy. RADIATION DOSE REDUCTION: This exam was performed according to the departmental dose-optimization program which includes automated exposure control, adjustment of the mA and/or kV according to patient size and/or use of  iterative reconstruction technique. CONTRAST:  75mL OMNIPAQUE IOHEXOL 350 MG/ML SOLN COMPARISON:  10/22/2013 FINDINGS: Cardiovascular: Satisfactory opacification of pulmonary arteries noted, and no pulmonary emboli identified. No evidence of thoracic aortic dissection or aneurysm. Mediastinum/Nodes: Prominent goiter with substernal extension again noted. No pathologically enlarged lymph nodes identified. Lungs/Pleura: Multifocal patchy areas of airspace disease again seen throughout both lungs, with mild improvement since prior study. Consistent with inflammatory or infectious etiology. Masses identified. No No evidence of pleural effusion. Upper abdomen: No acute findings. Musculoskeletal: No suspicious bone lesions identified. Review of the MIP images confirms the above findings. IMPRESSION: No evidence of pulmonary embolism. Multifocal patchy areas of airspace disease throughout both lungs, with mild improvement since prior study. Differential diagnosis includes inflammatory or infectious etiologies, pulmonary edema, or hemorrhage. Stable goiter with substernal extension. Electronically Signed   By: Danae Orleans M.D.   On: 04/17/2023 16:01   DG Chest Portable 1 View  Result Date: 04/16/2023 CLINICAL DATA:  Cough and COVID-19 positivity EXAM: PORTABLE CHEST 1 VIEW COMPARISON:  10/22/2013 FINDINGS: Cardiac shadow is mildly prominent but stable. Lungs are well aerated bilaterally. No focal infiltrate is seen. Mild central vascular congestion is noted. No bony abnormality is noted. IMPRESSION: Mild central vascular congestion without significant edema. Electronically Signed   By: Alcide Clever M.D.   On: 04/16/2023 01:41    Microbiology: Results for orders placed or performed during the hospital encounter of 04/16/23  SARS Coronavirus 2 by RT PCR (hospital order, performed in [facility] hospital lab) *cepheid single result test* Anterior Nasal Swab     Status: Abnormal   Collection Time: 04/16/23  3:50 AM    Specimen: Anterior Nasal Swab  Result Value Ref Range Status   SARS Coronavirus 2 by RT PCR POSITIVE (A) NEGATIVE Final    Comment: (NOTE) SARS-CoV-2 target nucleic acids are DETECTED  SARS-CoV-2 RNA is generally detectable in upper respiratory specimens  during the acute phase of infection.  Positive results are indicative  of the presence of the identified virus, but do not rule out bacterial infection or co-infection with other pathogens not detected by the test.  Clinical correlation with patient history and  other diagnostic information is necessary to determine patient infection status.  The expected result is negative.  Fact Sheet for Patients:   RoadLapTop.co.za   Fact Sheet for Healthcare Providers:   http://kim-miller.com/    This test is not yet approved or cleared by the [place] FDA and  has been authorized for detection and/or diagnosis of SARS-CoV-2 by FDA under an Emergency Use Authorization (EUA).  This EUA will remain in effect (meaning this test can be used) for the duration of  the COVID-19 declaration under Section 564(b)(1)  of the  Act, 21 U.S.C. section 360-bbb-3(b)(1), unless the authorization is terminated or revoked sooner.   Performed at [facility], [address] [place], [place] [address]     Labs: CBC: Recent Labs  Lab 04/16/23 0045 04/17/23 0437  WBC 7.0 6.5  HGB 11.4* 12.7  HCT 36.2 39.6  MCV 86.0 85.2  PLT 211 239   Basic Metabolic Panel: Recent Labs  Lab 04/16/23 0045 04/17/23 0437 04/18/23 0351  NA 133* 134* 137  K 4.5 4.6 4.7  CL 103 106 111  CO2 21* 19* 16*  GLUCOSE 84 133* 118*  BUN 49* 43* 37*  CREATININE 1.74* 1.29* 1.12*  CALCIUM 8.1* 8.7* 8.5*  MG  --  2.4  --   PHOS  --  3.2  --    Liver Function Tests: Recent Labs  Lab 04/16/23 0045 04/17/23 0437  AST 46* 39  ALT 25 27  ALKPHOS 55 62  BILITOT 0.5 0.9  PROT 6.0* 6.9  ALBUMIN 2.8* 3.1*   CBG: Recent  Labs  Lab 04/17/23 1523 04/17/23 2033 04/18/23 0008 04/18/23 0439 04/18/23 0735  GLUCAP 144* 149* 136* 127* 123*    Discharge time spent: greater than 30 minutes.  Signed: Catarina Hartshorn, MD [facility] 04/18/2023

## 2023-05-04 ENCOUNTER — Telehealth: Payer: Self-pay | Admitting: Cardiology

## 2023-05-04 NOTE — Telephone Encounter (Signed)
Yes that's fine for now, can she get a PA appt within 3 weeks to see if can start adding back her prior regimen over time  Dominga Ferry MD

## 2023-05-04 NOTE — Telephone Encounter (Signed)
Pt is requesting a callback since she was last seen in the hospital she stated a doctor reduced her prescribed medication and she has some concerns about the changes that have been made. Please advise

## 2023-05-04 NOTE — Telephone Encounter (Signed)
Per hospital note:  Essential HTN -holding lisinopril due to AKI--continue to hold  -hold coreg due to soft BPs>>restart at lower dose 6.25 mg bid -would recheck BMP and BPs in one week after d/c and if continues to improve>>plan to restart -holding spironolactone>>restart at d/c   Pt stated that Coreg was reduced to 3.125 mg BID (reflected on medication list) and she is questioning if this is correct from Cardiology standpoint.  Please advise.

## 2023-05-04 NOTE — Telephone Encounter (Signed)
Patient notified and verbalized understanding. Pt has appt with B. Strader, PA-C on 10/1 for hosp f/u

## 2023-05-24 ENCOUNTER — Encounter: Payer: Self-pay | Admitting: Student

## 2023-05-24 ENCOUNTER — Ambulatory Visit: Payer: Managed Care, Other (non HMO) | Attending: Student | Admitting: Student

## 2023-05-24 VITALS — BP 106/62 | HR 94 | Ht 63.0 in | Wt 212.2 lb

## 2023-05-24 DIAGNOSIS — E782 Mixed hyperlipidemia: Secondary | ICD-10-CM | POA: Diagnosis not present

## 2023-05-24 DIAGNOSIS — I428 Other cardiomyopathies: Secondary | ICD-10-CM | POA: Diagnosis not present

## 2023-05-24 DIAGNOSIS — N179 Acute kidney failure, unspecified: Secondary | ICD-10-CM

## 2023-05-24 DIAGNOSIS — I5032 Chronic diastolic (congestive) heart failure: Secondary | ICD-10-CM | POA: Diagnosis not present

## 2023-05-24 DIAGNOSIS — I10 Essential (primary) hypertension: Secondary | ICD-10-CM

## 2023-05-24 NOTE — Patient Instructions (Signed)
Medication Instructions:  Your physician recommends that you continue on your current medications as directed. Please refer to the Current Medication list given to you today.  *If you need a refill on your cardiac medications before your next appointment, please call your pharmacy*   Lab Work: NONE   If you have labs (blood work) drawn today and your tests are completely normal, you will receive your results only by: MyChart Message (if you have MyChart) OR A paper copy in the mail If you have any lab test that is abnormal or we need to change your treatment, we will call you to review the results.   Testing/Procedures: Your physician has requested that you have an echocardiogram. Echocardiography is a painless test that uses sound waves to create images of your heart. It provides your doctor with information about the size and shape of your heart and how well your heart's chambers and valves are working. This procedure takes approximately one hour. There are no restrictions for this procedure. Please do NOT wear cologne, perfume, aftershave, or lotions (deodorant is allowed). Please arrive 15 minutes prior to your appointment time.    Follow-Up: At [facility], you and your health needs are our priority.  As part of our continuing mission to provide you with exceptional heart care, we have created designated Provider Care Teams.  These Care Teams include your primary Cardiologist (physician) and Advanced Practice Providers (APPs -  Physician Assistants and Nurse Practitioners) who all work together to provide you with the care you need, when you need it.  We recommend signing up for the patient portal called "MyChart".  Sign up information is provided on this After Visit Summary.  MyChart is used to connect with patients for Virtual Visits (Telemedicine).  Patients are able to view lab/test results, encounter notes, upcoming appointments, etc.  Non-urgent messages can be sent to  your provider as well.   To learn more about what you can do with MyChart, go to [email or web address].    Your next appointment:   3 month(s)  Provider:   Dina Rich, MD or Randall An, PA-C    Other Instructions Thank you for choosing [facility]!

## 2023-05-24 NOTE — Progress Notes (Signed)
Cardiology Office Note    Date:  05/24/2023  ID:  Bridget Little, DOB 05/20/1961, MRN [id number] Cardiologist: Dina Rich, MD    History of Present Illness:    Bridget Little is a 62 y.o. female with past medical history of chronic HFimpEF/NICM (EF 25-30% in 2015 with cath showing patent cors, EF normalized to 60-65% by repeat imaging), HTN, HLD and OSA who presents to the office today for hospital follow-up.  She was examined by Dr. Wyline Mood in 11/2022 and denied any recent respiratory issues at that time.  She was continued on her current medical therapy with ASA 81 mg daily, Atorvastatin 40 mg daily, Coreg 25 mg twice daily, Jardiance 25 mg daily, Lisinopril 40 mg daily and Spironolactone 12.5 mg twice daily.  In the interim, she was admitted to [facility] from 8/24 - 04/18/2023 for evaluation of progressive weakness and a poor appetite and was found to have COVID-19 pneumonia. CTA Chest showed no evidence of a PE but was noted to have multifocal patchy areas of airspace disease. She was also found to a AKI and hyponatremia on admission but this improved with IV fluids throughout. Creatinine peaked at 1.74 and had improved to 1.12 at the time of discharge. Lisinopril was discontinued at discharge and Coreg was reduced to 3.125 mg twice daily given soft BP. A follow-up visit was recommended to see if medications could gradually be adjusted.  She did have follow-up labs on 05/03/2023 which showed Na+ was normal at 143 and creatinine had improved to 0.86.  In talking with the patient today, she reports that her respiratory status has significantly improved since her hospitalization. She denies any specific dyspnea on exertion, orthopnea, palpitations or chest pain. Reports occasional lower extremity edema and takes Lasix 40 mg daily and has only had a take an extra tablet approximately 2 days over the past few months. Her BP is soft at 102/64 during today's visit with a similar value of  106/62 on recheck. She denies any associated symptoms with this. Says her SBP has been higher than this when checked at home. In reviewing her weights, she has lost over 15 pounds since her last office visit and this has been intentional given diet changes and also while being on Ozempic.  Studies Reviewed:   EKG: EKG is not ordered today. EKG from 04/16/2023 is reviewed and demonstrates NSR, HR 95 with anterior infarct pattern. No acute changes when compared to prior tracings.    Echocardiogram: 02/2014 Study Conclusions   - Procedure narrative: Transthoracic echocardiography. Image    quality was adequate. The study was technically difficult, as a    result of poor sound wave transmission and body habitus.    Intravenous contrast (Optison) was administered.  - Left ventricle: The cavity size was normal. Wall thickness was    normal. Systolic function was normal. The estimated ejection    fraction was in the range of 60% to 65%. Wall motion was normal;    there were no regional wall motion abnormalities. Left    ventricular diastolic function parameters were normal.  - Right ventricle: Not well visualized. Grossly appears normal in    size and function. RV TAPSE is 2.3 cm.   Physical Exam:   VS:  BP 106/62   Pulse 94   Ht 5\' 3"  (1.6 m)   Wt 212 lb 3.2 oz (96.3 kg)   LMP 05/19/2012   SpO2 98%   BMI 37.59 kg/m    Wt Readings  from Last 3 Encounters:  05/24/23 212 lb 3.2 oz (96.3 kg)  04/16/23 227 lb 1.2 oz (103 kg)  12/09/22 227 lb (103 kg)     GEN: Well nourished, well developed female appearing in no acute distress NECK: No JVD; No carotid bruits CARDIAC: RRR, no murmurs, rubs, gallops RESPIRATORY:  Clear to auscultation without rales, wheezing or rhonchi  ABDOMEN: Appears non-distended. No obvious abdominal masses. EXTREMITIES: No clubbing or cyanosis. No pitting edema.  Distal pedal pulses are 2+ bilaterally.   Assessment and Plan:   1. Chronic HFimpEF/NICM  - Her EF  was at 25-30% in 2015 with cath showing patent cors and had normalized by repeat imaging later that year. As discussed above, her medications were adjusted during her recent admission given her AKI and hypotension. Given her BP at 102/64 and at 106/62 by recheck, I am hesitant to restart Lisinopril at this time. She is currently taking Coreg 3.125 mg twice daily, Jardiance 25 mg daily, Lasix 40 mg daily and Spironolactone 12.5 mg twice daily. She was provided with a BP log and pending her trend, can further titrate Coreg to 6.25 mg twice daily. Would hold off on restarting Lisinopril given her current readings. We reviewed that her weight loss could also be playing a role as she has lost over 15 pounds within the past 6 months given dietary changes and also while being on Ozempic. Will obtain a follow-up echocardiogram for reassessment of her EF given the timeframe since last evaluation.  2. HTN - Her BP remains soft at 106/62 on recheck during today's visit but she denies any associated symptoms with this. Continue Coreg 3.125 mg twice daily and Spironolactone 12.5 mg twice daily.  She was provided with a BP log to return in several weeks for reassessment.  3. HLD - LDL was 86 in 08/2022. Continue current medical therapy with Atorvastatin 40 mg daily.  4. Recent AKI - Creatinine had peaked at 1.74 during her recent admission and follow-up labs on 05/03/2023 showed this had normalized to 0.86. Na+ was also normal at 143. She remains on Jardiance 25 mg daily (on this dose for Type 2 DM), Lasix 40mg  daily and Spironolactone 12.5mg  BID. Would not restart Lisinopril as outlined above in the setting of soft BP.   Signed, Ellsworth Lennox, PA-C

## 2023-06-16 ENCOUNTER — Encounter: Payer: Self-pay | Admitting: Cardiology

## 2023-06-22 ENCOUNTER — Other Ambulatory Visit (HOSPITAL_COMMUNITY)
Admission: RE | Admit: 2023-06-22 | Discharge: 2023-06-22 | Disposition: A | Discharge status: Home / Self Care | Payer: Managed Care, Other (non HMO) | Source: Ambulatory Visit | Attending: Obstetrics & Gynecology | Admitting: Obstetrics & Gynecology

## 2023-06-22 ENCOUNTER — Encounter: Payer: Self-pay | Admitting: Obstetrics & Gynecology

## 2023-06-22 ENCOUNTER — Ambulatory Visit (INDEPENDENT_AMBULATORY_CARE_PROVIDER_SITE_OTHER): Payer: Managed Care, Other (non HMO) | Admitting: Obstetrics & Gynecology

## 2023-06-22 VITALS — BP 126/83 | HR 48 | Ht 64.0 in | Wt 213.0 lb

## 2023-06-22 DIAGNOSIS — R9389 Abnormal findings on diagnostic imaging of other specified body structures: Secondary | ICD-10-CM | POA: Insufficient documentation

## 2023-06-22 DIAGNOSIS — N950 Postmenopausal bleeding: Secondary | ICD-10-CM

## 2023-06-22 DIAGNOSIS — D252 Subserosal leiomyoma of uterus: Secondary | ICD-10-CM | POA: Diagnosis not present

## 2023-06-22 DIAGNOSIS — E669 Obesity, unspecified: Secondary | ICD-10-CM

## 2023-06-22 DIAGNOSIS — N83201 Unspecified ovarian cyst, right side: Secondary | ICD-10-CM

## 2023-06-22 NOTE — Progress Notes (Signed)
GYN VISIT Patient name: Bridget Little MRN [id number]  Date of birth: [birth year] Chief Complaint:   Follow-up (Ultrasound done at [facility] for fibroids showed some "thickening")  History of Present Illness:   Bridget Little is a 62 y.o. [id number] PM female being seen today for referral from PCP- [facility] Dr. Claretta Fraise due to results of recent US.  Per pt she was noted to have uterine fibroids on ultrasound.  Upon further questioning,   She reports some pink spotting- last happened a few months ago.  Lasted a few days then resolved on its own. Only when she wiped, very light.  No further bleeding since that time.  Reports no other acute GYN concerns   Denies discharge, itching or irritation. No pelvic or abdominal.  No nausea/vomiting.  She does note some constipation, but that has been going on since COVID.  Patient's last menstrual period was 05/19/2012.  [facility] records reviewed: Pap 09/2022: Normal [place] 05/2023: Heterogenous, lobulated uterus with multiple partially calcified fibroids. The uterus measures 11.2 x 7.2 x 10.0 cm. Representative fibroids as follows: -- Left posterior subserosal/intramural calcified fibroid measures 3.4 x 2.9 x 3.4 cm. --Right posterior subserosal calcified fibroid measures 2.6 x 2.5 x 2.5 cm -- Left anterior subserosal/intramural fibroid measures 1.6 x 1.3 x 1.6 cm  The endometrium was thickened with some internal vascularity, measuring 2.59 cm. Multiple nabothian cysts. Right ovary with simple appearing 7.8cm cyst.  Left ovary not well seen.    Review of Systems:   Pertinent items are noted in HPI Denies fever/chills, dizziness, headaches, visual disturbances, fatigue, shortness of breath, chest pain, abdominal pain, vomiting. Pertinent History Reviewed:   Past Surgical History:  Procedure Laterality Date   BACK SURGERY     Lumbar fusion   COLONOSCOPY  06/02/2012   Incomplete colonoscopy due to redundant colon.   COLONOSCOPY WITH PROPOFOL N/A  12/14/2021   Procedure: COLONOSCOPY WITH PROPOFOL;  Surgeon: Lanelle Bal, DO;  Location: [facility];  Service: Endoscopy;  Laterality: N/A;  2:00pm   LEFT AND RIGHT HEART CATHETERIZATION WITH CORONARY ANGIOGRAM N/A 10/24/2013   Procedure: LEFT AND RIGHT HEART CATHETERIZATION WITH CORONARY ANGIOGRAM;  Surgeon: Corky Crafts, MD;  Location: [facility] CATH LAB;  Service: Cardiovascular;  Laterality: N/A;   POLYPECTOMY  12/14/2021   Procedure: POLYPECTOMY;  Surgeon: Lanelle Bal, DO;  Location: [facility];  Service: Endoscopy;;   TUBAL LIGATION      Past Medical History:  Diagnosis Date   CHF (congestive heart failure) (HCC)    Chronic back pain    Diabetes mellitus, type II (HCC)    Essential hypertension, benign    Hypercholesteremia    Neuromuscular disorder (HCC)    right foot   Sleep apnea    Reviewed problem list, medications and allergies. Physical Assessment:   Vitals:   06/22/23 1155 06/22/23 1200  BP: (!) 148/86 126/83  Pulse: (!) 104 (!) 48  Weight: 213 lb (96.6 kg)   Height: 5\' 4"  (1.626 m)   Body mass index is 36.56 kg/m.       Physical Examination:   General appearance: alert, well appearing, and in no distress  Psych: mood appropriate, normal affect  Skin: warm & dry   Cardiovascular: normal heart rate noted  Respiratory: normal respiratory effort, no distress  Abdomen: obese, soft, non-tender, no rebound, no guarding  Pelvic: VULVA: normal appearing vulva with no masses, tenderness or lesions, VAGINA: normal appearing vagina with normal color and discharge, no  lesions, CERVIX: normal appearing cervix without discharge or lesions, UTERUS: uterus is normal size, shape, consistency and nontender  Extremities: no edema   Chaperone:  Freddie Apley     Endometrial Biopsy Procedure Note  Pre-operative Diagnosis: Postmenopausal bleeding, thickened endometrium  Post-operative Diagnosis: same  Procedure Details  The risks (including infection,  bleeding, pain, and uterine perforation) and benefits of the procedure were explained to the patient and Written informed consent was obtained.  Antibiotic prophylaxis against endocarditis was not indicated.   The patient was placed in the dorsal lithotomy position.  Bimanual exam showed the uterus to be in the neutral position.  A speculum inserted in the vagina, and the cervix prepped with betadine.     A single tooth tenaculum was applied to the anterior lip of the cervix for stabilization.  Os finder was used.  A Pipelle endometrial aspirator was used to sample the endometrium.  Sample was sent for pathologic examination.  Condition: Stable  Complications: None    Assessment & Plan:  1) postmenopausal bleeding, thickened endometrium -Discussed potential causes and reviewed recent ultrasound report -Discussed fibroids, which at this point are a benign finding -Next step pending results of pathology.   The patient was advised to call for any fever or for prolonged or severe pain or bleeding. She was advised to use OTC analgesics as needed for mild to moderate pain. She was advised to avoid vaginal intercourse for 48 hours or until the bleeding has completely stopped.  2) Ovarian cyst -ORAD- Cat 2, benign findings -Patient asymptomatic, reviewed plan for repeat ultrasound in 12 months   Return in about 1 year (around 06/21/2024) for Annual and pelvic US (with our office).   Myna Hidalgo, DO Attending Obstetrician & Gynecologist, [facility] for [employer], [facility]

## 2023-06-24 LAB — SURGICAL PATHOLOGY

## 2023-06-28 ENCOUNTER — Encounter: Payer: Self-pay | Admitting: Obstetrics & Gynecology

## 2023-06-30 ENCOUNTER — Ambulatory Visit (HOSPITAL_COMMUNITY)
Admission: RE | Admit: 2023-06-30 | Discharge: 2023-06-30 | Disposition: A | Discharge status: Home / Self Care | Payer: Managed Care, Other (non HMO) | Source: Ambulatory Visit | Attending: Student | Admitting: Student

## 2023-06-30 DIAGNOSIS — I428 Other cardiomyopathies: Secondary | ICD-10-CM | POA: Insufficient documentation

## 2023-06-30 LAB — ECHOCARDIOGRAM COMPLETE
AR max vel: 2.68 cm2
AV Area VTI: 3.1 cm2
AV Area mean vel: 3.35 cm2
AV Mean grad: 3.4 mm[Hg]
AV Peak grad: 8 mm[Hg]
Ao pk vel: 1.42 m/s
Area-P 1/2: 3.02 cm2
Calc EF: 48 %
Est EF: 45
S' Lateral: 3.8 cm
Single Plane A2C EF: 43 %
Single Plane A4C EF: 51.4 %

## 2023-06-30 NOTE — Progress Notes (Signed)
  Echocardiogram 2D Echocardiogram has been performed.  Bridget Little 09/08/2022, 11:15 AM

## 2023-06-30 NOTE — Patient Instructions (Signed)
Bridget Little  06/30/2023     @PREFPERIOPPHARMACY @   Your procedure is scheduled on  07/05/2023.   Report to [facility] at  [phone] A.M.   Call this number if you have problems the morning of surgery:  [phone]  If you experience any cold or flu symptoms such as cough, fever, chills, shortness of breath, etc. between now and your scheduled surgery, please notify us at the above number.   Remember:      Your last dose of semaglutide soul have been on 06/27/2023.       Your last dose of jardiance should be on 07/01/2023.        DO NOT take any medications for diabetes the morning of your procedure.    Do not eat after midnight.    You may drink clear liquids until 0600 am on 07/05/2023.     Clear liquids allowed are:                    Water, Juice (No red color; non-citric and without pulp; diabetics please choose diet or no sugar options), Carbonated beverages (diabetics please choose diet or no sugar options), Clear Tea (No creamer, milk, or cream, including half & half and powdered creamer), Black Coffee Only (No creamer, milk or cream, including half & half and powdered creamer), and Clear Sports drink (No red color; diabetics please choose diet or no sugar options)     Take these medicines the morning of surgery with A SIP OF WATER                        allopurinol, carvedilol, gabapentin.     Do not wear jewelry, make-up or nail polish, including gel polish,  artificial nails, or any other type of covering on natural nails (fingers and  toes).  Do not wear lotions, powders, or perfumes, or deodorant.  Do not shave 48 hours prior to surgery.  Men may shave face and neck.  Do not bring valuables to the hospital.  [facility] is not responsible for any belongings or valuables.  Contacts, dentures or bridgework may not be worn into surgery.  Leave your suitcase in the car.  After surgery it may be brought to your room.  For patients admitted to the  hospital, discharge time will be determined by your treatment team.  Patients discharged the day of surgery will not be allowed to drive home and must have someone with them for 24 hours.    Special instructions:   DO NOT smoke tobacco or vape for 24 hours before your procedure.  Please read over the following fact sheets that you were given. Coughing and Deep Breathing, Surgical Site Infection Prevention, Anesthesia Post-op Instructions, and Care and Recovery After Surgery       Dilation and Curettage or Vacuum Curettage, Care After The following information offers guidance on how to care for yourself after your procedure. Your doctor may also give you more specific instructions. If you have problems or questions, contact your doctor. What can I expect after the procedure? After the procedure, it is common to have: Mild pain or cramps. Some bleeding or spotting from the vagina. These may last for up to 2 weeks. Follow these instructions at home: Medicines Take over-the-counter and prescription medicines only as told by your doctor. If told, take steps to prevent problems with pooping (constipation). You may need to: Drink enough fluid  to keep your pee (urine) pale yellow. Take medicines. You will be told what medicines to take. Eat foods that are high in fiber. These include beans, whole grains, and fresh fruits and vegetables. Limit foods that are high in fat and sugar. These include fried or sweet foods. Ask your doctor if you should avoid driving or using machines while you are taking your medicine. Activity  If you were given a medicine to help you relax (sedative) during your procedure, it can affect you for many hours. Do not drive or use machinery until your doctor says that it is safe. Rest as told by your doctor. Get up to take short walks every 1-2 hours. Ask for help if you feel weak or unsteady. Do not lift anything that is heavier than 10 lb (4.5 kg), or the limit that  you are told. Return to your normal activities when your doctor says that it is safe. Lifestyle For at least 2 weeks, or as long as told by your doctor: Do not douche. Do not use tampons. Do not have sex. General instructions Do not take baths, swim, or use a hot tub. Ask your doctor if you may take showers. Do not smoke or use any products that contain nicotine or tobacco. These can delay healing. If you need help quitting, ask your doctor. Wear compression stockings as told by your doctor. It is up to you to get the results of your procedure. Ask how to get your results when they are ready. Keep all follow-up visits. Contact a doctor if: You have very bad cramps that get worse or do not get better with medicine. You have very bad pain in your belly (abdomen). You cannot drink fluids without vomiting. You have pain in the area just above your thighs. You have fluid from your vagina that smells bad. You have a rash. Get help right away if: You are bleeding a lot from your vagina. This means soaking more than one sanitary pad in 1 hour, and this happens for 2 hours in a row. You have a fever that is above 100.64F (38C). Your belly feels very tender or hard. You have chest pain. You have trouble breathing. You feel dizzy or light-headed. You faint. You have pain in your neck or shoulder area. These symptoms may be an emergency. Get help right away. Call your local emergency services (911 in the [place] Do not wait to see if the symptoms will go away. Do not drive yourself to the hospital. Summary After your procedure, it is common to have pain or cramping. It is also common to have bleeding or spotting from your vagina. Rest as told. Get up to take short walks every 1-2 hours. Do not lift anything that is heavier than 10 lb (4.5 kg), or the limit that you are told. Get help right away if you have problems from the procedure. Ask your doctor what problems to watch for. This  information is not intended to replace advice given to you by your health care provider. Make sure you discuss any questions you have with your health care provider. Document Revised: 07/30/2020 Document Reviewed: 07/30/2020 Elsevier Patient Education  2024 [employer] General Anesthesia, Adult, Care After The following information offers guidance on how to care for yourself after your procedure. Your health care provider may also give you more specific instructions. If you have problems or questions, contact your health care provider. What can I expect after the procedure? After the procedure, it is  common for people to: Have pain or discomfort at the IV site. Have nausea or vomiting. Have a sore throat or hoarseness. Have trouble concentrating. Feel cold or chills. Feel weak, sleepy, or tired (fatigue). Have soreness and body aches. These can affect parts of the body that were not involved in surgery. Follow these instructions at home: For the time period you were told by your health care provider:  Rest. Do not participate in activities where you could fall or become injured. Do not drive or use machinery. Do not drink alcohol. Do not take sleeping pills or medicines that cause drowsiness. Do not make important decisions or sign legal documents. Do not take care of children on your own. General instructions Drink enough fluid to keep your urine pale yellow. If you have sleep apnea, surgery and certain medicines can increase your risk for breathing problems. Follow instructions from your health care provider about wearing your sleep device: Anytime you are sleeping, including during daytime naps. While taking prescription pain medicines, sleeping medicines, or medicines that make you drowsy. Return to your normal activities as told by your health care provider. Ask your health care provider what activities are safe for you. Take over-the-counter and prescription medicines only as  told by your health care provider. Do not use any products that contain nicotine or tobacco. These products include cigarettes, chewing tobacco, and vaping devices, such as e-cigarettes. These can delay incision healing after surgery. If you need help quitting, ask your health care provider. Contact a health care provider if: You have nausea or vomiting that does not get better with medicine. You vomit every time you eat or drink. You have pain that does not get better with medicine. You cannot urinate or have bloody urine. You develop a skin rash. You have a fever. Get help right away if: You have trouble breathing. You have chest pain. You vomit blood. These symptoms may be an emergency. Get help right away. Call 911. Do not wait to see if the symptoms will go away. Do not drive yourself to the hospital. Summary After the procedure, it is common to have a sore throat, hoarseness, nausea, vomiting, or to feel weak, sleepy, or fatigue. For the time period you were told by your health care provider, do not drive or use machinery. Get help right away if you have difficulty breathing, have chest pain, or vomit blood. These symptoms may be an emergency. This information is not intended to replace advice given to you by your health care provider. Make sure you discuss any questions you have with your health care provider. Document Revised: 11/06/2021 Document Reviewed: 11/06/2021 Elsevier Patient Education  2024 [employer] How to Use Chlorhexidine Before Surgery Chlorhexidine gluconate (CHG) is a germ-killing (antiseptic) solution that is used to clean the skin. It can get rid of the bacteria that normally live on the skin and can keep them away for about 24 hours. To clean your skin with CHG, you may be given: A CHG solution to use in the shower or as part of a sponge bath. A prepackaged cloth that contains CHG. Cleaning your skin with CHG may help lower the risk for infection: While you  are staying in the intensive care unit of the hospital. If you have a vascular access, such as a central line, to provide short-term or long-term access to your veins. If you have a catheter to drain urine from your bladder. If you are on a ventilator. A ventilator is a machine  that helps you breathe by moving air in and out of your lungs. After surgery. What are the risks? Risks of using CHG include: A skin reaction. Hearing loss, if CHG gets in your ears and you have a perforated eardrum. Eye injury, if CHG gets in your eyes and is not rinsed out. The CHG product catching fire. Make sure that you avoid smoking and flames after applying CHG to your skin. Do not use CHG: If you have a chlorhexidine allergy or have previously reacted to chlorhexidine. On babies younger than 60 months of age. How to use CHG solution Use CHG only as told by your health care provider, and follow the instructions on the label. Use the full amount of CHG as directed. Usually, this is one bottle. During a shower Follow these steps when using CHG solution during a shower (unless your health care provider gives you different instructions): Start the shower. Use your normal soap and shampoo to wash your face and hair. Turn off the shower or move out of the shower stream. Pour the CHG onto a clean washcloth. Do not use any type of brush or rough-edged sponge. Starting at your neck, lather your body down to your toes. Make sure you follow these instructions: If you will be having surgery, pay special attention to the part of your body where you will be having surgery. Scrub this area for at least 1 minute. Do not use CHG on your head or face. If the solution gets into your ears or eyes, rinse them well with water. Avoid your genital area. Avoid any areas of skin that have broken skin, cuts, or scrapes. Scrub your back and under your arms. Make sure to wash skin folds. Let the lather sit on your skin for 1-2 minutes  or as long as told by your health care provider. Thoroughly rinse your entire body in the shower. Make sure that all body creases and crevices are rinsed well. Dry off with a clean towel. Do not put any substances on your body afterward--such as powder, lotion, or perfume--unless you are told to do so by your health care provider. Only use lotions that are recommended by the manufacturer. Put on clean clothes or pajamas. If it is the night before your surgery, sleep in clean sheets.  During a sponge bath Follow these steps when using CHG solution during a sponge bath (unless your health care provider gives you different instructions): Use your normal soap and shampoo to wash your face and hair. Pour the CHG onto a clean washcloth. Starting at your neck, lather your body down to your toes. Make sure you follow these instructions: If you will be having surgery, pay special attention to the part of your body where you will be having surgery. Scrub this area for at least 1 minute. Do not use CHG on your head or face. If the solution gets into your ears or eyes, rinse them well with water. Avoid your genital area. Avoid any areas of skin that have broken skin, cuts, or scrapes. Scrub your back and under your arms. Make sure to wash skin folds. Let the lather sit on your skin for 1-2 minutes or as long as told by your health care provider. Using a different clean, wet washcloth, thoroughly rinse your entire body. Make sure that all body creases and crevices are rinsed well. Dry off with a clean towel. Do not put any substances on your body afterward--such as powder, lotion, or perfume--unless you are told  to do so by your health care provider. Only use lotions that are recommended by the manufacturer. Put on clean clothes or pajamas. If it is the night before your surgery, sleep in clean sheets. How to use CHG prepackaged cloths Only use CHG cloths as told by your health care provider, and follow the  instructions on the label. Use the CHG cloth on clean, dry skin. Do not use the CHG cloth on your head or face unless your health care provider tells you to. When washing with the CHG cloth: Avoid your genital area. Avoid any areas of skin that have broken skin, cuts, or scrapes. Before surgery Follow these steps when using a CHG cloth to clean before surgery (unless your health care provider gives you different instructions): Using the CHG cloth, vigorously scrub the part of your body where you will be having surgery. Scrub using a back-and-forth motion for 3 minutes. The area on your body should be completely wet with CHG when you are done scrubbing. Do not rinse. Discard the cloth and let the area air-dry. Do not put any substances on the area afterward, such as powder, lotion, or perfume. Put on clean clothes or pajamas. If it is the night before your surgery, sleep in clean sheets.  For general bathing Follow these steps when using CHG cloths for general bathing (unless your health care provider gives you different instructions). Use a separate CHG cloth for each area of your body. Make sure you wash between any folds of skin and between your fingers and toes. Wash your body in the following order, switching to a new cloth after each step: The front of your neck, shoulders, and chest. Both of your arms, under your arms, and your hands. Your stomach and groin area, avoiding the genitals. Your right leg and foot. Your left leg and foot. The back of your neck, your back, and your buttocks. Do not rinse. Discard the cloth and let the area air-dry. Do not put any substances on your body afterward--such as powder, lotion, or perfume--unless you are told to do so by your health care provider. Only use lotions that are recommended by the manufacturer. Put on clean clothes or pajamas. Contact a health care provider if: Your skin gets irritated after scrubbing. You have questions about using  your solution or cloth. You swallow any chlorhexidine. Call your local poison control center ([phone] in the [place] Get help right away if: Your eyes itch badly, or they become very red or swollen. Your skin itches badly and is red or swollen. Your hearing changes. You have trouble seeing. You have swelling or tingling in your mouth or throat. You have trouble breathing. These symptoms may represent a serious problem that is an emergency. Do not wait to see if the symptoms will go away. Get medical help right away. Call your local emergency services (911 in the [place] Do not drive yourself to the hospital. Summary Chlorhexidine gluconate (CHG) is a germ-killing (antiseptic) solution that is used to clean the skin. Cleaning your skin with CHG may help to lower your risk for infection. You may be given CHG to use for bathing. It may be in a bottle or in a prepackaged cloth to use on your skin. Carefully follow your health care provider's instructions and the instructions on the product label. Do not use CHG if you have a chlorhexidine allergy. Contact your health care provider if your skin gets irritated after scrubbing. This information is not intended to replace  advice given to you by your health care provider. Make sure you discuss any questions you have with your health care provider. Document Revised: 12/07/2021 Document Reviewed: 10/20/2020 Elsevier Patient Education  2023 [employer].

## 2023-07-01 ENCOUNTER — Encounter (HOSPITAL_COMMUNITY): Payer: Self-pay

## 2023-07-01 ENCOUNTER — Encounter (HOSPITAL_COMMUNITY)
Admission: RE | Admit: 2023-07-01 | Discharge: 2023-07-01 | Disposition: A | Discharge status: Home / Self Care | Payer: Managed Care, Other (non HMO) | Source: Ambulatory Visit | Attending: Obstetrics & Gynecology

## 2023-07-01 ENCOUNTER — Other Ambulatory Visit: Payer: Self-pay

## 2023-07-04 NOTE — H&P (Signed)
[facility] Obstetrics and Gynecology Attending History and Physical  Bridget Little is a 63 y.o. [id number] postmenopausal female who presents for scheduled hysteroscopy, D&C, Mirena insertion  In review, patient noted vaginal pink spotting a few months ago.  She has not had any further bleeding  Workup was completed including a pelvic ultrasound which showed a thickened endometrium.  Endometrial office was performed that showed endometrial hyperplasia.    She continues to note vaginal spotting.  Denies any abnormal vaginal discharge, fevers, chills, sweats, dysuria, nausea, vomiting, other GI or GU symptoms or other general symptoms.  Past Medical History:  Diagnosis Date   CHF (congestive heart failure) (HCC)    Chronic back pain    Diabetes mellitus, type II (HCC)    Essential hypertension, benign    Hypercholesteremia    Neuromuscular disorder (HCC)    right foot   Sleep apnea    Past Surgical History:  Procedure Laterality Date   BACK SURGERY     Lumbar fusion   COLONOSCOPY  06/02/2012   Incomplete colonoscopy due to redundant colon.   COLONOSCOPY WITH PROPOFOL N/A 12/14/2021   Procedure: COLONOSCOPY WITH PROPOFOL;  Surgeon: Lanelle Bal, DO;  [facility];  Service: Endoscopy;  Laterality: N/A;  2:00pm   LEFT AND RIGHT HEART CATHETERIZATION WITH CORONARY ANGIOGRAM N/A 10/24/2013   Procedure: LEFT AND RIGHT HEART CATHETERIZATION WITH CORONARY ANGIOGRAM;  Surgeon: Corky Crafts, MD;  Location: [facility] CATH LAB;  Service: Cardiovascular;  Laterality: N/A;   POLYPECTOMY  12/14/2021   Procedure: POLYPECTOMY;  Surgeon: Lanelle Bal, DO;  [facility];  Service: Endoscopy;;   TUBAL LIGATION     OB History  Gravida Para Term Preterm AB Living  2 2 2     2   SAB IAB Ectopic Multiple Live Births               # Outcome Date GA Lbr Len/2nd Weight Sex Type Anes PTL Lv  2 Term           1 Term           Patient denies any other pertinent  gynecologic issues.  No current facility-administered medications on file prior to encounter.   Current Outpatient Medications on File Prior to Encounter  Medication Sig Dispense Refill   allopurinol (ZYLOPRIM) 100 MG tablet Take 100 mg by mouth 2 (two) times daily.     aspirin 81 MG tablet Take 81 mg by mouth daily.     atorvastatin (LIPITOR) 40 MG tablet Take 40 mg by mouth daily.     Calcium Carb-Cholecalciferol (CALCIUM HIGH POTENCY/VITAMIN D) 600-5 MG-MCG TABS Take 1 tablet by mouth daily.     carvedilol (COREG) 6.25 MG tablet Take 6.25 mg by mouth 2 (two) times daily with a meal.     colchicine 0.6 MG tablet Take 0.6-1.2 mg by mouth See admin instructions. Take 1.2 mg as needed at onset of gout flare may take an additional 0.6 mg an hour later as needed. Do not repeat for 3 days     diclofenac Sodium (VOLTAREN) 1 % GEL Apply 2 g topically 2 (two) times daily as needed (pain).     empagliflozin (JARDIANCE) 25 MG TABS tablet Take 25 mg by mouth daily.     EPINEPHrine (EPI-PEN) 0.3 mg/0.3 mL SOAJ injection Inject 0.3 mg into the muscle as needed for anaphylaxis.     furosemide (LASIX) 40 MG tablet Take 40 mg by mouth daily.  gabapentin (NEURONTIN) 300 MG capsule Take 300 mg by mouth 3 (three) times daily.      glipiZIDE (GLUCOTROL) 10 MG tablet Take 10 mg by mouth 2 (two) times daily.     Krill Oil 1000 MG CAPS Take 1,000 mg by mouth daily.     metFORMIN (GLUCOPHAGE) 1000 MG tablet Take 1 tablet by mouth 2 (two) times daily with a meal.     NON FORMULARY Pt uses a c-pap nightly     PREVIDENT 5000 DRY MOUTH 1.1 % GEL dental gel Place 1 Application onto teeth at bedtime.     Semaglutide, 2 MG/DOSE, (OZEMPIC, 2 MG/DOSE,) 8 MG/3ML SOPN Inject 2 mg into the skin every Sunday.     silver sulfADIAZINE (SILVADENE) 1 % cream Apply 1 application. topically daily as needed (diabetic related sores).     spironolactone (ALDACTONE) 25 MG tablet Take 0.5 tablets (12.5 mg total) by mouth 2 (two) times  daily. 30 tablet 5   topiramate (TOPAMAX) 25 MG tablet Take 25 mg by mouth at bedtime.     traZODone (DESYREL) 50 MG tablet Take 25-50 mg by mouth at bedtime as needed for sleep (insomnia).     tretinoin (RETIN-A) 0.025 % cream Apply 1 Application topically at bedtime as needed (acne). Pea-sized amount to entire face at bedtime.     carvedilol (COREG) 3.125 MG tablet Take 1 tablet (3.125 mg total) by mouth 2 (two) times daily with a meal. (Patient not taking: Reported on 06/29/2023) 30 tablet 1   Allergies  Allergen Reactions   Bee Venom Anaphylaxis and Hives    Social History:   reports that she has never smoked. She has never used smokeless tobacco. She reports that she does not currently use alcohol. She reports that she does not use drugs. Family History  Problem Relation Age of Onset   Depression Mother    Prostate cancer Father    Schizophrenia Sister    Depression Sister    Colon cancer Neg Hx    Breast cancer Neg Hx    Colon polyps Neg Hx     Review of Systems: Pertinent items noted in HPI and remainder of comprehensive ROS otherwise negative.  PHYSICAL EXAM: Last menstrual period 05/19/2012. BP 133/83   Pulse 85   Temp 97.7 F (36.5 C) (Oral)   Resp 18   LMP 05/19/2012   SpO2 100%   CONSTITUTIONAL: Well-developed, well-nourished female in no acute distress.  SKIN: Skin is warm and dry. No rash noted. Not diaphoretic. No erythema. No pallor. NEUROLOGIC: Alert and oriented to person, place, and time. Normal reflexes, muscle tone coordination. No cranial nerve deficit noted. PSYCHIATRIC: Normal mood and affect. Normal behavior. Normal judgment and thought content. CARDIOVASCULAR: Normal heart rate noted, regular rhythm RESPIRATORY: Effort and breath sounds normal, no problems with respiration noted ABDOMEN: obese, soft, nontender, nondistended. PELVIC: deferred MUSCULOSKELETAL: no calf tenderness bilaterally EXT: no edema bilaterally, normal pulses  Labs: Results  for orders placed or performed during the hospital encounter of 06/30/23 (from the past 336 hour(s))  ECHOCARDIOGRAM COMPLETE   Collection Time: 06/30/23 11:10 AM  Result Value Ref Range   Single Plane A2C EF 43.0 %   Single Plane A4C EF 51.4 %   Calc EF 48.0 %   Area-P 1/2 3.02 cm2   S' Lateral 3.80 cm   AR max vel 2.68 cm2   AV Area mean vel 3.35 cm2   AV Area VTI 3.10 cm2   Est EF 45  AV Peak grad 8.0 mmHg   Ao pk vel 1.42 m/s   AV Mean grad 3.4 mmHg  Results for orders placed or performed in visit on 06/22/23 (from the past 336 hour(s))  Surgical pathology   Collection Time: 06/22/23 12:32 PM  Result Value Ref Range   SURGICAL PATHOLOGY      SURGICAL PATHOLOGY CASE: [id number] PATIENT: Adryanna Sobotka Surgical Pathology Report     Clinical History: PMB, thickened endometrium (cm)     FINAL MICROSCOPIC DIAGNOSIS:  A. ENDOMETRIUM, BIOPSY:  Endometrial hyperplasia without atypia (simple hyperplasia). Focal stromal changes suggestive of endometrial polyp. Negative for endometrial intraepithelial neoplasia (EIN) or malignancy.  GROSS DESCRIPTION: A.  Endometrial biopsy", received in formalin is a 2.5 x 2.0 x 0.2 cm gregate of tan-brown tissue fragments and hemorrhagic, gelatinous material.  The specimen is filtered and submitted in toto in 1 block (A1).  [facility] 10.31.2024  Final Diagnosis performed by Jimmy Picket, MD.   Electronically signed 06/24/2023 Technical and / or Professional components performed at [facility]. [facility], [address] [place], [place] [address].  Immunohistochemistry Technical component (if applicable) was performed at [facility]. [address], [place], [place] [address].   IMMUNOHISTOCHEMISTRY DISCLAIMER (if applicable): Some of these immunohistochemical stains may have been developed and the performance characteristics determine by [facility]. Some may not have been  cleared or approved by the U.S. Food and Drug Administration. The FDA has determined that such clearance or approval is not necessary. This test is used for clinical purposes. It should not be regarded as investigational or for research. This laboratory is certified under the Clinical Laboratory Improvement Amendments of 1988 (CLIA-88) as qualified to perform high complexity clinical laboratory testing.  The controls stained appropriately.   IHC stains are performed on formalin fixed, paraffin embedded tissue using a 3,3"diaminobenzidine (DAB) chromogen and Leica Bond Autostainer System. The staining intensity of the nucleus is score manually and is reported as the percentage of tumor cell nuclei demonstrating speci fic nuclear staining. The specimens are fixed in 10% Neutral Formalin for at least 6 hours and up to 72hrs. These tests are validated on decalcified tissue. Results should be interpreted with caution given the possibility of false negative results on decalcified specimens. Antibody Clones are as follows ER-clone 95F, PR-clone 16, Ki67- clone MM1. Some of these immunohistochemical stains may have been developed and the performance characteristics determined by [facility].     Imaging Studies: [place] 05/2023: Heterogenous, lobulated uterus with multiple partially calcified fibroids. The uterus measures 11.2 x 7.2 x 10.0 cm. Representative fibroids as follows: -- Left posterior subserosal/intramural calcified fibroid measures 3.4 x 2.9 x 3.4 cm. --Right posterior subserosal calcified fibroid measures 2.6 x 2.5 x 2.5 cm -- Left anterior subserosal/intramural fibroid measures 1.6 x 1.3 x 1.6 cm   Assessment: Thickened endometrium Endometrial hyperplasia Postmenopausal bleeding  Plan: Hysteroscopy, D&C, Mirena IUD insertion -NPO -LR @ 125cc/hr -SCDs to OR -IV Toradol -Risk/benefits and alternatives reviewed with the patient including but not limited to risk of  bleeding, infection and injury such as uterine perforation which could lead to injury of surrounding organs requiring further surgical intervention.  Questions and concerns were addressed and pt desires to proceed  Myna Hidalgo, DO Attending Obstetrician & Gynecologist, [facility] for [facility], [facility]

## 2023-07-05 ENCOUNTER — Other Ambulatory Visit: Payer: Self-pay

## 2023-07-05 ENCOUNTER — Ambulatory Visit (HOSPITAL_COMMUNITY): Payer: Managed Care, Other (non HMO) | Admitting: Anesthesiology

## 2023-07-05 ENCOUNTER — Ambulatory Visit (HOSPITAL_COMMUNITY)
Admission: RE | Admit: 2023-07-05 | Discharge: 2023-07-05 | Disposition: A | Discharge status: Home / Self Care | Payer: Managed Care, Other (non HMO) | Attending: Obstetrics & Gynecology | Admitting: Obstetrics & Gynecology

## 2023-07-05 ENCOUNTER — Encounter (HOSPITAL_COMMUNITY)
Admission: RE | Disposition: A | Discharge status: Home / Self Care | Payer: Self-pay | Source: Home / Self Care | Attending: Obstetrics & Gynecology

## 2023-07-05 DIAGNOSIS — N83201 Unspecified ovarian cyst, right side: Secondary | ICD-10-CM

## 2023-07-05 DIAGNOSIS — Z7984 Long term (current) use of oral hypoglycemic drugs: Secondary | ICD-10-CM | POA: Diagnosis not present

## 2023-07-05 DIAGNOSIS — N8500 Endometrial hyperplasia, unspecified: Secondary | ICD-10-CM | POA: Diagnosis not present

## 2023-07-05 DIAGNOSIS — I509 Heart failure, unspecified: Secondary | ICD-10-CM | POA: Diagnosis not present

## 2023-07-05 DIAGNOSIS — E119 Type 2 diabetes mellitus without complications: Secondary | ICD-10-CM

## 2023-07-05 DIAGNOSIS — R9389 Abnormal findings on diagnostic imaging of other specified body structures: Secondary | ICD-10-CM | POA: Diagnosis not present

## 2023-07-05 DIAGNOSIS — N950 Postmenopausal bleeding: Secondary | ICD-10-CM | POA: Diagnosis present

## 2023-07-05 DIAGNOSIS — I110 Hypertensive heart disease with heart failure: Secondary | ICD-10-CM | POA: Insufficient documentation

## 2023-07-05 DIAGNOSIS — G4730 Sleep apnea, unspecified: Secondary | ICD-10-CM | POA: Insufficient documentation

## 2023-07-05 HISTORY — PX: HYSTEROSCOPY WITH D & C: SHX1775

## 2023-07-05 LAB — GLUCOSE, CAPILLARY
Glucose-Capillary: 122 mg/dL — ABNORMAL HIGH (ref 70–99)
Glucose-Capillary: 144 mg/dL — ABNORMAL HIGH (ref 70–99)

## 2023-07-05 SURGERY — DILATATION AND CURETTAGE /HYSTEROSCOPY
Anesthesia: General | Site: Uterus

## 2023-07-05 MED ORDER — FENTANYL CITRATE (PF) 100 MCG/2ML IJ SOLN
INTRAMUSCULAR | Status: DC | PRN
  Administered 2023-07-05: 25 ug via INTRAVENOUS

## 2023-07-05 MED ORDER — ORAL CARE MOUTH RINSE
15.0000 mL | Freq: Once | OROMUCOSAL | Status: AC
  Administered 2023-07-05: 15 mL via OROMUCOSAL

## 2023-07-05 MED ORDER — GLYCOPYRROLATE PF 0.2 MG/ML IJ SOSY
PREFILLED_SYRINGE | INTRAMUSCULAR | Status: DC | PRN
  Administered 2023-07-05: .2 mg via INTRAVENOUS

## 2023-07-05 MED ORDER — OXYCODONE HCL 5 MG/5ML PO SOLN: 5.0000 mg | Freq: Once | ORAL | Status: DC | PRN

## 2023-07-05 MED ORDER — ONDANSETRON HCL 4 MG/2ML IJ SOLN
INTRAMUSCULAR | Status: AC
  Filled 2023-07-05: qty 4

## 2023-07-05 MED ORDER — PHENYLEPHRINE 80 MCG/ML (10ML) SYRINGE FOR IV PUSH (FOR BLOOD PRESSURE SUPPORT)
PREFILLED_SYRINGE | INTRAVENOUS | Status: AC
  Filled 2023-07-05: qty 30

## 2023-07-05 MED ORDER — FENTANYL CITRATE (PF) 100 MCG/2ML IJ SOLN
INTRAMUSCULAR | Status: AC
  Filled 2023-07-05: qty 2

## 2023-07-05 MED ORDER — ONDANSETRON HCL 4 MG/2ML IJ SOLN
INTRAMUSCULAR | Status: DC | PRN
  Administered 2023-07-05: 4 mg via INTRAVENOUS

## 2023-07-05 MED ORDER — MIDAZOLAM HCL 5 MG/5ML IJ SOLN
INTRAMUSCULAR | Status: DC | PRN
  Administered 2023-07-05 (×2): 1 mg via INTRAVENOUS

## 2023-07-05 MED ORDER — LEVONORGESTREL 20 MCG/DAY IU IUD
1.0000 | INTRAUTERINE_SYSTEM | INTRAUTERINE | Status: AC
Start: 2023-07-05 — End: 2023-07-05
  Administered 2023-07-05: 1 via INTRAUTERINE
  Filled 2023-07-05: qty 1

## 2023-07-05 MED ORDER — MIDAZOLAM HCL 2 MG/2ML IJ SOLN
INTRAMUSCULAR | Status: AC
  Filled 2023-07-05: qty 2

## 2023-07-05 MED ORDER — SODIUM CHLORIDE 0.9 % IR SOLN
Status: DC | PRN
  Administered 2023-07-05: 1000 mL

## 2023-07-05 MED ORDER — PHENYLEPHRINE 80 MCG/ML (10ML) SYRINGE FOR IV PUSH (FOR BLOOD PRESSURE SUPPORT)
PREFILLED_SYRINGE | INTRAVENOUS | Status: DC | PRN
  Administered 2023-07-05 (×4): 160 ug via INTRAVENOUS

## 2023-07-05 MED ORDER — OXYCODONE HCL 5 MG PO TABS: 5.0000 mg | ORAL_TABLET | Freq: Once | ORAL | Status: DC | PRN

## 2023-07-05 MED ORDER — LIDOCAINE-EPINEPHRINE 0.5 %-1:200000 IJ SOLN
INTRAMUSCULAR | Status: DC | PRN
  Administered 2023-07-05: 20 mL

## 2023-07-05 MED ORDER — PROPOFOL 10 MG/ML IV BOLUS
INTRAVENOUS | Status: AC
  Filled 2023-07-05: qty 20

## 2023-07-05 MED ORDER — DEXAMETHASONE SODIUM PHOSPHATE 10 MG/ML IJ SOLN
INTRAMUSCULAR | Status: AC
  Filled 2023-07-05: qty 1

## 2023-07-05 MED ORDER — LIDOCAINE HCL (PF) 2 % IJ SOLN
INTRAMUSCULAR | Status: AC
  Filled 2023-07-05: qty 10

## 2023-07-05 MED ORDER — PROPOFOL 10 MG/ML IV BOLUS
INTRAVENOUS | Status: DC | PRN
  Administered 2023-07-05: 50 mg via INTRAVENOUS
  Administered 2023-07-05: 180 ug/kg/min via INTRAVENOUS

## 2023-07-05 MED ORDER — CHLORHEXIDINE GLUCONATE 0.12 % MT SOLN: 15.0000 mL | Freq: Once | OROMUCOSAL | Status: AC

## 2023-07-05 MED ORDER — FENTANYL CITRATE PF 50 MCG/ML IJ SOSY: 25.0000 ug | PREFILLED_SYRINGE | INTRAMUSCULAR | Status: DC | PRN

## 2023-07-05 MED ORDER — KETOROLAC TROMETHAMINE 30 MG/ML IJ SOLN
30.0000 mg | INTRAMUSCULAR | Status: AC
  Administered 2023-07-05: 30 mg via INTRAVENOUS
  Filled 2023-07-05: qty 1

## 2023-07-05 MED ORDER — LIDOCAINE HCL (CARDIAC) PF 100 MG/5ML IV SOSY
PREFILLED_SYRINGE | INTRAVENOUS | Status: DC | PRN
  Administered 2023-07-05: 40 mg via INTRATRACHEAL

## 2023-07-05 MED ORDER — ONDANSETRON HCL 4 MG/2ML IJ SOLN: 4.0000 mg | Freq: Once | INTRAMUSCULAR | Status: DC | PRN

## 2023-07-05 MED ORDER — GLYCOPYRROLATE PF 0.2 MG/ML IJ SOSY
PREFILLED_SYRINGE | INTRAMUSCULAR | Status: AC
  Filled 2023-07-05: qty 5

## 2023-07-05 MED ORDER — LIDOCAINE-EPINEPHRINE 0.5 %-1:200000 IJ SOLN
INTRAMUSCULAR | Status: AC
Start: 2023-07-05 — End: ?
  Filled 2023-07-05: qty 50

## 2023-07-05 MED ORDER — LACTATED RINGERS IV SOLN: INTRAVENOUS | Status: DC | PRN

## 2023-07-05 SURGICAL SUPPLY — 31 items
CLOTH BEACON ORANGE TIMEOUT ST (SAFETY) ×1 IMPLANT
COVER LIGHT HANDLE STERIS (MISCELLANEOUS) ×3 IMPLANT
DEVICE MYOSURE LITE (MISCELLANEOUS) IMPLANT
DEVICE MYOSURE REACH (MISCELLANEOUS) IMPLANT
DILATOR CANAL MILEX (MISCELLANEOUS) IMPLANT
GAUZE 4X4 16PLY ~~LOC~~+RFID DBL (SPONGE) ×2 IMPLANT
GLOVE BIO SURGEON STRL SZ 6.5 (GLOVE) ×1 IMPLANT
GLOVE BIOGEL PI IND STRL 7.0 (GLOVE) ×3 IMPLANT
GOWN STRL REUS W/ TWL LRG LVL3 (GOWN DISPOSABLE) ×1 IMPLANT
GOWN STRL REUS W/TWL LRG LVL3 (GOWN DISPOSABLE) ×2 IMPLANT
IV NS IRRIG 3000ML ARTHROMATIC (IV SOLUTION) ×1 IMPLANT
KIT IUD INSERTION AND REMOVAL (MISCELLANEOUS) IMPLANT
KIT PROCEDURE FLUENT (KITS) ×1 IMPLANT
KIT TURNOVER CYSTO (KITS) ×1 IMPLANT
KIT TURNOVER KIT A (KITS) ×1 IMPLANT
MYOSURE XL FIBROID (MISCELLANEOUS)
NDL HYPO 18GX1.5 BLUNT FILL (NEEDLE) ×1 IMPLANT
NEEDLE HYPO 18GX1.5 BLUNT FILL (NEEDLE) ×1
NS IRRIG 1000ML POUR BTL (IV SOLUTION) ×1 IMPLANT
PACK PERI GYN (CUSTOM PROCEDURE TRAY) ×1 IMPLANT
PAD ARMBOARD 7.5X6 YLW CONV (MISCELLANEOUS) ×1 IMPLANT
PAD TELFA 3X4 1S STER (GAUZE/BANDAGES/DRESSINGS) ×1 IMPLANT
POSITIONER HEAD 8X9X4 ADT (SOFTGOODS) ×1 IMPLANT
SEAL ROD LENS SCOPE MYOSURE (ABLATOR) ×1 IMPLANT
SET BASIN LINEN APH (SET/KITS/TRAYS/PACK) ×1 IMPLANT
SOL PREP POV-IOD 4OZ 10% (MISCELLANEOUS) ×1 IMPLANT
SYR 30ML LL (SYRINGE) ×1 IMPLANT
SYR CONTROL 10ML LL (SYRINGE) ×1 IMPLANT
SYSTEM TISS REMOVAL MYOSURE XL (MISCELLANEOUS) IMPLANT
TOWEL OR 17X26 4PK STRL BLUE (TOWEL DISPOSABLE) ×1 IMPLANT
UNDERPAD 30X36 HEAVY ABSORB (UNDERPADS AND DIAPERS) ×1 IMPLANT

## 2023-07-05 NOTE — Anesthesia Preprocedure Evaluation (Addendum)
Anesthesia Evaluation  Patient identified by MRN, date of birth, ID band Patient awake    Reviewed: Allergy & Precautions, NPO status , Patient's Chart, lab work & pertinent test results, reviewed documented beta blocker date and time   Airway Mallampati: II  TM Distance: >3 FB Neck ROM: Full    Dental no notable dental hx. (+) Dental Advisory Given, Teeth Intact   Pulmonary sleep apnea    Pulmonary exam normal breath sounds clear to auscultation       Cardiovascular Exercise Tolerance: Poor hypertension, Pt. on medications and Pt. on home beta blockers +CHF  Normal cardiovascular exam Rhythm:Regular Rate:Normal     Neuro/Psych  Neuromuscular disease  negative psych ROS   GI/Hepatic negative GI ROS, Neg liver ROS,,,  Endo/Other  diabetes, Well Controlled, Type 2, Oral Hypoglycemic Agents    Renal/GU negative Renal ROS  negative genitourinary   Musculoskeletal negative musculoskeletal ROS (+)    Abdominal Normal abdominal exam  (+)   Peds negative pediatric ROS (+)  Hematology negative hematology ROS (+)   Anesthesia Other Findings   Reproductive/Obstetrics negative OB ROS                             Anesthesia Physical Anesthesia Plan  ASA: 3  Anesthesia Plan: General   Post-op Pain Management:    Induction: Intravenous  PONV Risk Score and Plan: Propofol infusion  Airway Management Planned: Nasal Cannula and Natural Airway  Additional Equipment: None  Intra-op Plan:   Post-operative Plan:   Informed Consent: I have reviewed the patients History and Physical, chart, labs and discussed the procedure including the risks, benefits and alternatives for the proposed anesthesia with the patient or authorized representative who has indicated his/her understanding and acceptance.     Dental advisory given  Plan Discussed with: CRNA  Anesthesia Plan Comments:          Anesthesia Quick Evaluation

## 2023-07-05 NOTE — Transfer of Care (Signed)
Immediate Anesthesia Transfer of Care Note  Patient: Bridget Little  Procedure(s) Performed: DILATATION AND CURETTAGE /HYSTEROSCOPY; MIRENA IUD INSERTION (Uterus)  Patient Location: PACU  Anesthesia Type:General  Level of Consciousness: drowsy  Airway & Oxygen Therapy: Patient Spontanous Breathing and Patient connected to face mask oxygen  Post-op Assessment: Report given to RN  Post vital signs: Reviewed  Last Vitals:  Vitals Value Taken Time  BP 98/60 07/05/23 1011  Temp    Pulse 93 07/05/23 1014  Resp 15 07/05/23 1014  SpO2 99 % 07/05/23 1014  Vitals shown include unfiled device data.  Last Pain:  Vitals:   07/05/23 0812  TempSrc: Oral  PainSc: 0-No pain      Patients Stated Pain Goal: 5 (07/05/23 4782)  Complications: No notable events documented.

## 2023-07-05 NOTE — Discharge Instructions (Addendum)
HOME INSTRUCTIONS  Please note any unusual or excessive bleeding, pain, swelling. Mild dizziness or drowsiness are normal for about 24 hours after surgery.   Shower when comfortable  Restrictions: No driving for 24 hours or while taking pain medications.  Activity:  No heavy lifting (> 10 lbs), nothing in vagina (no tampons, douching, or intercourse) x 2 weeks; no tub baths for 2 weeks Vaginal spotting is expected but if your bleeding is heavy, period like,  please call the office   Diet:  You may return to your regular diet.  Do not eat large meals.  Eat small frequent meals throughout the day.  Continue to drink a good amount of water at least 6-8 glasses of water per day, hydration is very important for the healing process.  Pain Management: Take over the counter tylenol or ibuprofen as needed for pain.  You can either take one or alternate between the two medications for pain management.  You may also use a heating pack as needed.    Alcohol -- Avoid for 24 hours and while taking pain medications.  Nausea: Take sips of ginger ale or soda  Fever -- Call physician if temperature over 101 degrees  Follow up:  If you do not already have a follow up appointment scheduled, please call the office at [phone].  If you experience fever (a temperature greater than 100.4), pain unrelieved by pain medication, shortness of breath, swelling of a single leg, or any other symptoms which are concerning to you please the office immediately.

## 2023-07-05 NOTE — Anesthesia Postprocedure Evaluation (Signed)
Anesthesia Post Note  Patient: Bridget Little  Procedure(s) Performed: DILATATION AND CURETTAGE /HYSTEROSCOPY; MIRENA IUD INSERTION (Uterus)  Patient location during evaluation: PACU Anesthesia Type: General Level of consciousness: awake and alert Pain management: pain level controlled Vital Signs Assessment: post-procedure vital signs reviewed and stable Respiratory status: spontaneous breathing, nonlabored ventilation, respiratory function stable and patient connected to nasal cannula oxygen Cardiovascular status: blood pressure returned to baseline and stable Postop Assessment: no apparent nausea or vomiting Anesthetic complications: no   There were no known notable events for this encounter.   Last Vitals:  Vitals:   07/05/23 1037 07/05/23 1051  BP: 116/75 110/76  Pulse:  86  Resp:  18  Temp:  (!) 36.4 C  SpO2:  100%    Last Pain:  Vitals:   07/05/23 1051  TempSrc: Oral  PainSc: 0-No pain                 Bridget Little

## 2023-07-05 NOTE — Op Note (Addendum)
Operative Report  PreOp: 1) postmenopausal bleeding 2) endometrial hyperplasia PostOp: same Procedure:  Hysteroscopy, Dilation and Curettage, IUD insertion Surgeon: Dr. Myna Hidalgo Anesthesia: General Complications:none EBL: Minimal IVF:600cc Discrepancy: 5cc  Findings: 11cm uterus with markedly proliferative endometrium.  Both ostia visualized.  Specimens: endometrial curettings  Procedure: The patient was taken to the operating room where she underwent general anesthesia without difficulty. The patient was placed in a low lithotomy position using Allen stirrups. She was then prepped and draped in the normal sterile fashion. Sterile speculum was placed.  A single tooth tenaculum was placed on the anterior lip of the cervix. Cervical block was completed using 0.5% lidocaine with epinephrine-20cc  The uterus was then sounded to 11cm. The endocervical canal was then serially dilated to 14French using Hank dilators to accommodate the hysteroscopic apparatus.  The hysteroscope was inserted and findings were visualized as noted above.  The hysteroscope was removed and sharp curettage was performed. The tissue was sent to pathology.   The uterus was sounded to 11 cm.  Mirena  IUD placed per manufacturer's recommendations. The strings were trimmed to approximately 3 cm.    All instrument were then removed. Hemostasis was observed at the cervical site. The patient was repositioned to the supine position. The patient tolerated the procedure without any complications and taken to recovery in stable condition.   Myna Hidalgo, DO Attending Obstetrician & Gynecologist, [facility] for [employer], [facility]

## 2023-07-07 LAB — SURGICAL PATHOLOGY

## 2023-07-11 ENCOUNTER — Encounter (HOSPITAL_COMMUNITY): Payer: Self-pay | Admitting: Obstetrics & Gynecology

## 2023-09-07 ENCOUNTER — Encounter: Payer: Self-pay | Admitting: Obstetrics & Gynecology

## 2023-09-07 ENCOUNTER — Ambulatory Visit: Payer: Managed Care, Other (non HMO) | Admitting: Obstetrics & Gynecology

## 2023-09-07 VITALS — BP 118/69 | HR 116

## 2023-09-07 DIAGNOSIS — N8500 Endometrial hyperplasia, unspecified: Secondary | ICD-10-CM

## 2023-09-07 DIAGNOSIS — Z30431 Encounter for routine checking of intrauterine contraceptive device: Secondary | ICD-10-CM

## 2023-09-07 DIAGNOSIS — E669 Obesity, unspecified: Secondary | ICD-10-CM | POA: Diagnosis not present

## 2023-09-07 NOTE — Progress Notes (Signed)
   GYN VISIT Patient name: Bridget Little MRN [id number]  Date of birth: 1961-03-21 Chief Complaint:   Routine Post Op  History of Present Illness:   Bridget Little is a 63 y.o. [id number]  female being seen today for follow up regarding:  -Endometrial hyperplasia: S/p hysterscopy,D&C, Mirena  insertion 07/05/2023.  Pathology negative for hyperplasia.  Denies vaginal bleeding or spotting.  Denies discharge, itching or irritation.  Denies pelvic or abdominal pain.  No acute complaints.  Husband has noted strings- no issue.  Patient's last menstrual period was 05/19/2012.    Review of Systems:   Pertinent items are noted in HPI Denies fever/chills, dizziness, headaches, visual disturbances, fatigue, shortness of breath, chest pain, abdominal pain, vomiting, no problem with bowel movements, urination, or intercourse unless otherwise stated above.  Pertinent History Reviewed:   Past Surgical History:  Procedure Laterality Date   BACK SURGERY     Lumbar fusion   COLONOSCOPY  06/02/2012   Incomplete colonoscopy due to redundant colon.   COLONOSCOPY WITH PROPOFOL  N/A 12/14/2021   Procedure: COLONOSCOPY WITH PROPOFOL ;  Surgeon: Vinetta Greening, DO;  [facility];  Service: Endoscopy;  Laterality: N/A;  2:00pm   HYSTEROSCOPY WITH D & C N/A 07/05/2023   Procedure: DILATATION AND CURETTAGE /HYSTEROSCOPY; MIRENA  IUD INSERTION;  Surgeon: Keene Pastures, DO;  Location: [facility] ORS;  Service: Gynecology;  Laterality: N/A;   LEFT AND RIGHT HEART CATHETERIZATION WITH CORONARY ANGIOGRAM N/A 10/24/2013   Procedure: LEFT AND RIGHT HEART CATHETERIZATION WITH CORONARY ANGIOGRAM;  Surgeon: Lucendia Rusk, MD;  Location: [facility] CATH LAB;  Service: Cardiovascular;  Laterality: N/A;   POLYPECTOMY  12/14/2021   Procedure: POLYPECTOMY;  Surgeon: Vinetta Greening, DO;  [facility];  Service: Endoscopy;;   TUBAL LIGATION      Past Medical History:  Diagnosis Date   CHF  (congestive heart failure) (HCC)    Chronic back pain    Diabetes mellitus, type II (HCC)    Essential hypertension, benign    Hypercholesteremia    Neuromuscular disorder (HCC)    right foot   Sleep apnea    Reviewed problem list, medications and allergies. Physical Assessment:   Vitals:   09/07/23 1134  BP: 118/69  Pulse: (!) 116  There is no height or weight on file to calculate BMI.       Physical Examination:   General appearance: alert, well appearing, and in no distress  Psych: mood appropriate, normal affect  Skin: warm & dry   Cardiovascular: normal heart rate noted  Respiratory: normal respiratory effort, no distress  Abdomen: obese, soft, non-tender   Pelvic: VULVA: normal appearing vulva with no masses, tenderness or lesions, VAGINA: normal appearing vagina with normal color and discharge, no lesions, CERVIX: normal appearing cervix without discharge or lesions, strings visualized  Extremities: no edema   Chaperone: Lorean Rodes    Assessment & Plan:  1) Endometrial hyperplasia, Mirena  -Most recent pathology negative for hyperplasia -IUD in place, confirmed proper location -Will consider EMB as clinically indicated or in 1 to 2 years for surveillance as a more substantial biopsy did not show hyperplasia -PMB now resolved    No orders of the defined types were placed in this encounter.   Return in about 1 year (around 09/06/2024) for Annual.   Joey Lierman, DO Attending Obstetrician & Gynecologist, [facility] for [facility], [facility]  '

## 2023-09-26 ENCOUNTER — Ambulatory Visit: Payer: Managed Care, Other (non HMO) | Admitting: Cardiology

## 2023-09-28 ENCOUNTER — Encounter: Payer: Self-pay | Admitting: Cardiology

## 2023-09-28 ENCOUNTER — Ambulatory Visit: Payer: Managed Care, Other (non HMO) | Attending: Cardiology | Admitting: Cardiology

## 2023-09-28 VITALS — BP 130/62 | HR 107 | Ht 63.0 in | Wt 224.2 lb

## 2023-09-28 DIAGNOSIS — I5032 Chronic diastolic (congestive) heart failure: Secondary | ICD-10-CM | POA: Diagnosis not present

## 2023-09-28 DIAGNOSIS — I10 Essential (primary) hypertension: Secondary | ICD-10-CM

## 2023-09-28 DIAGNOSIS — E782 Mixed hyperlipidemia: Secondary | ICD-10-CM

## 2023-09-28 MED ORDER — LOSARTAN POTASSIUM 25 MG PO TABS: 12.5000 mg | ORAL_TABLET | Freq: Every day | ORAL | 3 refills | Status: DC

## 2023-09-28 NOTE — Patient Instructions (Signed)
 Medication Instructions:   START Losartan  12.5 mg daily   Labwork: Bmet in 2 weeks   Testing/Procedures: None today  Follow-Up: 6 weeks with Brittany Strader, PA-C  Any Other Special Instructions Will Be Listed Below (If Applicable).  If you need a refill on your cardiac medications before your next appointment, please call your pharmacy.

## 2023-09-28 NOTE — Progress Notes (Signed)
 Clinical Summary Ms. Verhagen is a 63 y.o.female seen today for follow up of the following medical problems.    1. NICM /HFimpEF - 10/2013 echo LVEF 25-30%, grade I diastolic dysfunction   - cath 01/7983 with patent coronaries - repeat echo 02/2014 after course of medical therapy showed normalization of LVEF at 60-65%.    06/2023 LVEF 45%, no WMAs, grade I dd  - no SOB/DOE, LE edema is controlled - compliant with meds    -03/2023  admission with AKI and hypontesion,HF meds held in setting of COVID pneumonia - remains off lisinopril  - no recent SOB/DOE, occasonal LE edema. Home weights stable around 226-228 lbs - compliant with meds.    2. HTN    - home bp's 130s-140s/60s-80s     3. OSA -followed by Dr Jude - using cpap   4. Hyperlipidemia - remains compliant with statin -07/2020 TG 196 HDL 30 LDL 58   - 10/2021 TC 128 TG 182 HDL 43 LDL 49 Jan 2024 TC 871 TG 834 HDL 42 LDL 53     Upcoming cruise to [place]   Past Medical History:  Diagnosis Date   CHF (congestive heart failure) (HCC)    Chronic back pain    Diabetes mellitus, type II (HCC)    Essential hypertension, benign    Hypercholesteremia    Neuromuscular disorder (HCC)    right foot   Sleep apnea      Allergies  Allergen Reactions   Bee Venom Anaphylaxis and Hives     Current Outpatient Medications  Medication Sig Dispense Refill   allopurinol (ZYLOPRIM) 100 MG tablet Take 100 mg by mouth 2 (two) times daily.     aspirin  81 MG tablet Take 81 mg by mouth daily.     atorvastatin  (LIPITOR) 40 MG tablet Take 40 mg by mouth daily.     Calcium  Carb-Cholecalciferol (CALCIUM  HIGH POTENCY/VITAMIN D) 600-5 MG-MCG TABS Take 1 tablet by mouth daily.     carvedilol  (COREG ) 3.125 MG tablet Take 1 tablet (3.125 mg total) by mouth 2 (two) times daily with a meal. 30 tablet 1   carvedilol  (COREG ) 6.25 MG tablet Take 6.25 mg by mouth 2 (two) times daily with a meal.     colchicine 0.6 MG tablet Take 0.6-1.2 mg  by mouth See admin instructions. Take 1.2 mg as needed at onset of gout flare may take an additional 0.6 mg an hour later as needed. Do not repeat for 3 days     empagliflozin (JARDIANCE) 25 MG TABS tablet Take 25 mg by mouth daily.     EPINEPHrine  (EPI-PEN) 0.3 mg/0.3 mL SOAJ injection Inject 0.3 mg into the muscle as needed for anaphylaxis.     furosemide  (LASIX ) 40 MG tablet Take 40 mg by mouth daily.     gabapentin (NEURONTIN) 300 MG capsule Take 300 mg by mouth 3 (three) times daily.      glipiZIDE (GLUCOTROL) 10 MG tablet Take 10 mg by mouth 2 (two) times daily.     Krill Oil 1000 MG CAPS Take 1,000 mg by mouth daily.     metFORMIN (GLUCOPHAGE) 1000 MG tablet Take 1 tablet by mouth 2 (two) times daily with a meal.     NON FORMULARY Pt uses a c-pap nightly     PREVIDENT 5000 DRY MOUTH 1.1 % GEL dental gel Place 1 Application onto teeth at bedtime.     Semaglutide, 2 MG/DOSE, (OZEMPIC, 2 MG/DOSE,) 8 MG/3ML SOPN Inject 2 mg  into the skin every Sunday.     silver sulfADIAZINE (SILVADENE) 1 % cream Apply 1 application. topically daily as needed (diabetic related sores).     spironolactone  (ALDACTONE ) 25 MG tablet Take 0.5 tablets (12.5 mg total) by mouth 2 (two) times daily. 30 tablet 5   topiramate (TOPAMAX) 25 MG tablet Take 25 mg by mouth at bedtime.     traZODone (DESYREL) 50 MG tablet Take 25-50 mg by mouth at bedtime as needed for sleep (insomnia).     tretinoin (RETIN-A) 0.025 % cream Apply 1 Application topically at bedtime as needed (acne). Pea-sized amount to entire face at bedtime.     No current facility-administered medications for this visit.     Past Surgical History:  Procedure Laterality Date   BACK SURGERY     Lumbar fusion   COLONOSCOPY  06/02/2012   Incomplete colonoscopy due to redundant colon.   COLONOSCOPY WITH PROPOFOL  N/A 12/14/2021   Procedure: COLONOSCOPY WITH PROPOFOL ;  Surgeon: Cindie Carlin POUR, DO;  Location: [facility];  Service: Endoscopy;  Laterality:  N/A;  2:00pm   HYSTEROSCOPY WITH D & C N/A 07/05/2023   Procedure: DILATATION AND CURETTAGE /HYSTEROSCOPY; MIRENA  IUD INSERTION;  Surgeon: Marilynn Nest, DO;  Location: [facility];  Service: Gynecology;  Laterality: N/A;   LEFT AND RIGHT HEART CATHETERIZATION WITH CORONARY ANGIOGRAM N/A 10/24/2013   Procedure: LEFT AND RIGHT HEART CATHETERIZATION WITH CORONARY ANGIOGRAM;  Surgeon: Candyce GORMAN Reek, MD;  Location: [facility] CATH LAB;  Service: Cardiovascular;  Laterality: N/A;   POLYPECTOMY  12/14/2021   Procedure: POLYPECTOMY;  Surgeon: Cindie Carlin POUR, DO;  Location: [facility];  Service: Endoscopy;;   TUBAL LIGATION       Allergies  Allergen Reactions   Bee Venom Anaphylaxis and Hives      Family History  Problem Relation Age of Onset   Depression Mother    Prostate cancer Father    Schizophrenia Sister    Depression Sister    Colon cancer Neg Hx    Breast cancer Neg Hx    Colon polyps Neg Hx      Social History Ms. Riggi reports that she has never smoked. She has never used smokeless tobacco. Ms. Schobert reports that she does not currently use alcohol .     Physical Examination Today's Vitals   09/28/23 1345  BP: 130/62  Pulse: (!) 107  SpO2: 97%  Weight: 224 lb 3.2 oz (101.7 kg)  Height: 5' 3 (1.6 m)   Body mass index is 39.72 kg/m.  Gen: resting comfortably, no acute distress HEENT: no scleral icterus, pupils equal round and reactive, no palptable cervical adenopathy,  CV: RRR, no m/rg, no jvd Resp: Clear to auscultation bilaterally GI: abdomen is soft, non-tender, non-distended, normal bowel sounds, no hepatosplenomegaly MSK: extremities are warm, no edema.  Skin: warm, no rash Neuro:  no focal deficits Psych: appropriate affect   Diagnostic Studies 10/2013 Echo   Study Conclusions  - Left ventricle: The cavity size was normal. Wall thickness was increased in a pattern of mild LVH. Systolic function was severely reduced. The estimated ejection  fraction was in the range of 25% to 30%. Possible severe hypokinesis of the anteroseptal myocardium. Doppler parameters are consistent with abnormal left ventricular relaxation (grade 1 diastolic dysfunction). - Aortic valve: Poorly visualized. Mildly calcified annulus. Probably trileaflet. No significant regurgitation. - Right ventricle: Systolic function was reduced. - Right atrium: Central venous pressure: 3mm Hg (est). - Tricuspid valve: Physiologic regurgitation. - Pulmonary arteries:  Systolic pressure could not be accurately estimated. - Pericardium, extracardiac: A prominent pericardial fat pad was present. Impressions:  - Extremely limited study. There is mild LVH, overall normal LV chamber size, LVEF roughly estimated at 25-30% based on available images. There appears to be significant anteroseptal hypokinesis based on some views. Optison  contrast may provide better images. There is grade 1 diastolic dysfunction. RV contraction is abnormal as well. No significant degree of mitral or tricuspid regurgitation noted, cannotassess PASP. CVP appears normal. Epicardial fat pad noted.  10/2013 Cath   Procedural Findings:   Hemodynamics:   AO: 172/87 mmHg   LV: 165/20 mmHg   LVEDP: 25 mmHg   Coronary angiography:   Coronary dominance: right   Left Main: normal   Left Anterior Descending (LAD): Normal in size with no significant disease.   1st diagonal (D1): Normal in size with no significant disease.   2nd diagonal (D2): Large in size with no significant disease.   3rd diagonal (D3): Normal in size with no significant disease.   Circumflex (LCx): Normal in size and nondominant the vessel is free of any significant disease.   1st obtuse marginal: Small in size with minor irregularities.   2nd obtuse marginal: Normal in size with no significant disease.   3rd obtuse marginal: Normal in size with no significant disease   Right Coronary Artery: normal in size and dominant the  vessel is free of any significant disease.   Posterior descending artery: large in size with no significant disease   Posterior AV segment: normal in size with no significant disease.   Posterolateral branchs: 3 normal size branchs which are free of significant disease. Left ventriculography: Left ventricular systolic function is severely reduced , LVEF is estimated at 25-30 %, there is no significant mitral regurgitation   Final Conclusions:   1. Normal coronary arteries.   2. Severely reduced LV systolic function with an ejection fraction of 25-30% due to nonischemic cardiomyopathy.   3. Moderately elevated left ventricular end-diastolic pressure   Recommendations:   Continue diuresis. Treat medically for nonischemic cardiomyopathy. Blood pressure control is recommended. The patient should be screened for sleep apnea and treated as indicated.       02/2014 Echo Study Conclusions  - Procedure narrative: Transthoracic echocardiography. Image   quality was adequate. The study was technically difficult, as a   result of poor sound wave transmission and body habitus.   Intravenous contrast (Optison ) was administered. - Left ventricle: The cavity size was normal. Wall thickness was   normal. Systolic function was normal. The estimated ejection   fraction was in the range of 60% to 65%. Wall motion was normal;   there were no regional wall motion abnormalities. Left   ventricular diastolic function parameters were normal. - Right ventricle: Not well visualized. Grossly appears normal in   size and function. RV TAPSE is 2.3 cm.   12/2013 sleep apnea IMPRESSION:  1. Moderate obstructive sleep apnea syndrome worse during REM sleep. A formal sleep titration CPAP recording is suggested. 2. Abnormal sleep architecture with poor sleep efficiency, fragmented sleep and absence of slow wave sleep.   Thanks for this referral.        Assessment and Plan   1. HFimpEF - initially LVEF 25-30% by  echo 10/2013, with medical therapy normalization of LVEF to 60-65%.  - recent echo with slight decline in LVEF. STart losartan  12.5mg  daily, bmet 2 weeks.    2. HTN   -mildly elevated by home numbers, start  losartan  12.5mg  daily.    3. Hyperlipidemia Has been at goal, continue current meds     Dorn PHEBE Ross, M.D.

## 2023-09-28 NOTE — Addendum Note (Signed)
 Addended by: Sakshi Sermons A on: 09/28/2023 02:07 PM   Modules accepted: Orders

## 2023-09-28 NOTE — Addendum Note (Signed)
 Addended by: Fayette Hamada A on: 09/28/2023 02:11 PM   Modules accepted: Orders

## 2023-10-11 ENCOUNTER — Other Ambulatory Visit (HOSPITAL_COMMUNITY)
Admission: RE | Admit: 2023-10-11 | Discharge: 2023-10-11 | Disposition: A | Discharge status: Home / Self Care | Payer: Managed Care, Other (non HMO) | Source: Ambulatory Visit | Attending: Cardiology | Admitting: Cardiology

## 2023-10-11 DIAGNOSIS — I5032 Chronic diastolic (congestive) heart failure: Secondary | ICD-10-CM | POA: Insufficient documentation

## 2023-10-11 DIAGNOSIS — I10 Essential (primary) hypertension: Secondary | ICD-10-CM | POA: Insufficient documentation

## 2023-10-11 LAB — BASIC METABOLIC PANEL
Anion gap: 11 (ref 5–15)
BUN: 37 mg/dL — ABNORMAL HIGH (ref 8–23)
CO2: 24 mmol/L (ref 22–32)
Calcium: 9.3 mg/dL (ref 8.9–10.3)
Chloride: 108 mmol/L (ref 98–111)
Creatinine, Ser: 1.15 mg/dL — ABNORMAL HIGH (ref 0.44–1.00)
GFR, Estimated: 54 mL/min — ABNORMAL LOW (ref >60)
Glucose, Bld: 274 mg/dL — ABNORMAL HIGH (ref 70–99)
Potassium: 5.2 mmol/L — ABNORMAL HIGH (ref 3.5–5.1)
Sodium: 143 mmol/L (ref 135–145)

## 2023-10-18 ENCOUNTER — Telehealth: Payer: Self-pay

## 2023-10-18 DIAGNOSIS — I5032 Chronic diastolic (congestive) heart failure: Secondary | ICD-10-CM

## 2023-10-18 NOTE — Telephone Encounter (Signed)
-----   Message from Dina Rich sent at 10/18/2023  8:34 AM EST ----- Labs look fine, slight uptrend in potassium. Can we repeat a bmet in 2 weeks please  Dominga Ferry MD

## 2023-10-18 NOTE — Telephone Encounter (Signed)
 The patient has been notified of the result and verbalized understanding.  All questions (if any) were answered. Roseanne Reno, CMA 10/18/2023 9:12 AM

## 2023-11-01 ENCOUNTER — Other Ambulatory Visit (HOSPITAL_COMMUNITY)
Admission: RE | Admit: 2023-11-01 | Discharge: 2023-11-01 | Disposition: A | Discharge status: Home / Self Care | Source: Ambulatory Visit | Attending: Cardiology | Admitting: Cardiology

## 2023-11-01 DIAGNOSIS — I5032 Chronic diastolic (congestive) heart failure: Secondary | ICD-10-CM | POA: Diagnosis present

## 2023-11-01 LAB — BASIC METABOLIC PANEL
Anion gap: 12 (ref 5–15)
BUN: 30 mg/dL — ABNORMAL HIGH (ref 8–23)
CO2: 26 mmol/L (ref 22–32)
Calcium: 9.3 mg/dL (ref 8.9–10.3)
Chloride: 107 mmol/L (ref 98–111)
Creatinine, Ser: 1.21 mg/dL — ABNORMAL HIGH (ref 0.44–1.00)
GFR, Estimated: 50 mL/min — ABNORMAL LOW (ref >60)
Glucose, Bld: 232 mg/dL — ABNORMAL HIGH (ref 70–99)
Potassium: 4.1 mmol/L (ref 3.5–5.1)
Sodium: 145 mmol/L (ref 135–145)

## 2023-11-10 ENCOUNTER — Ambulatory Visit: Payer: Managed Care, Other (non HMO) | Attending: Student | Admitting: Student

## 2023-11-10 ENCOUNTER — Encounter: Payer: Self-pay | Admitting: Student

## 2023-11-10 VITALS — BP 118/76 | HR 96 | Ht 63.0 in | Wt 226.0 lb

## 2023-11-10 DIAGNOSIS — E782 Mixed hyperlipidemia: Secondary | ICD-10-CM

## 2023-11-10 DIAGNOSIS — I5032 Chronic diastolic (congestive) heart failure: Secondary | ICD-10-CM

## 2023-11-10 DIAGNOSIS — I10 Essential (primary) hypertension: Secondary | ICD-10-CM | POA: Diagnosis not present

## 2023-11-10 DIAGNOSIS — G4733 Obstructive sleep apnea (adult) (pediatric): Secondary | ICD-10-CM | POA: Diagnosis not present

## 2023-11-10 MED ORDER — CARVEDILOL 6.25 MG PO TABS: 6.2500 mg | ORAL_TABLET | Freq: Two times a day (BID) | ORAL | 3 refills | Status: DC

## 2023-11-10 NOTE — Patient Instructions (Signed)
 Medication Instructions:   Increase Coreg to 6.25 mg Two Times Daily  Complete Blood Pressure Log   *If you need a refill on your cardiac medications before your next appointment, please call your pharmacy*   Lab Work: NONE   If you have labs (blood work) drawn today and your tests are completely normal, you will receive your results only by: MyChart Message (if you have MyChart) OR A paper copy in the mail If you have any lab test that is abnormal or we need to change your treatment, we will call you to review the results.   Testing/Procedures: NONE    Follow-Up: At [facility], you and your health needs are our priority.  As part of our continuing mission to provide you with exceptional heart care, we have created designated Provider Care Teams.  These Care Teams include your primary Cardiologist (physician) and Advanced Practice Providers (APPs -  Physician Assistants and Nurse Practitioners) who all work together to provide you with the care you need, when you need it.  We recommend signing up for the patient portal called "MyChart".  Sign up information is provided on this After Visit Summary.  MyChart is used to connect with patients for Virtual Visits (Telemedicine).  Patients are able to view lab/test results, encounter notes, upcoming appointments, etc.  Non-urgent messages can be sent to your provider as well.   To learn more about what you can do with MyChart, go to [email or web address].    Your next appointment:   6 -8 week(s)  Provider:   Dina Rich, MD or Randall An, PA-C    Other Instructions Thank you for choosing [facility]!

## 2023-11-10 NOTE — Progress Notes (Addendum)
 Cardiology Office Note    Date:  11/10/2023  ID:  NGUYEN BUTLER, DOB September 12, 1960, MRN [id number] Cardiologist: Dina Rich, MD    History of Present Illness:    Bridget Little is a 63 y.o. female with past medical history of chronic HFimpEF/NICM (EF 25-30% in 2015 with cath showing patent cors, EF normalized to 60-65% by repeat imaging and at 45% by echocardiogram in 06/2023), HTN, HLD and OSA who presents to the office today for 6-week follow-up.  She was last examined by Dr. Wyline Mood in 09/2023 and recent echocardiogram had shown her EF was at 45%. She was taking Coreg, Jardiance, Lasix and Spironolactone and it was recommended to start Losartan 12.5 mg daily with follow-up labs in 2 weeks. Follow-up labs showed K+ was elevated at 5.2 but creatinine was normal at 1.15. She did have repeat labs on 11/01/2023 and creatinine was stable at 1.21 and K+ had normalized at 4.1.  In talking with the patient today, she reports overall doing well since her last office visit. She denies any recent chest pain, dyspnea on exertion, orthopnea or PND. Does have intermittent lower extremity edema and takes Lasix 40 mg daily and an occasional extra tablet if needed for worsening swelling.  She does follow her weight at home and reports this has overall been stable. She has been keeping a blood pressure log as well and the lowest her BP has been is 114/76 and the highest was yesterday at 166/89 but this was only a few minutes after having taken her morning medications. She reports overall doing well since starting Losartan and denies any side effects to this. In reviewing her medications, she is listed as taking Coreg 3.125 mg twice daily and 6.25 mg twice daily but reports she has only been on 3.125 mg twice daily (previously on 25 mg twice daily for several years but this was reduced during a prior hospitalization).  Studies Reviewed:   EKG: EKG is not ordered today.  Echocardiogram:  06/2023 IMPRESSIONS     1. Left ventricular ejection fraction, by estimation, is 45%. The left  ventricle has mildly decreased function. The left ventricle has no  regional wall motion abnormalities. There is mild left ventricular  hypertrophy. Left ventricular diastolic  parameters are consistent with Grade I diastolic dysfunction (impaired  relaxation).   2. Right ventricular systolic function is normal. The right ventricular  size is normal.   3. The mitral valve is normal in structure. No evidence of mitral valve  regurgitation. No evidence of mitral stenosis.   4. The aortic valve has an indeterminant number of cusps. Aortic valve  regurgitation is not visualized. No aortic stenosis is present.   5. The inferior vena cava is normal in size with greater than 50%  respiratory variability, suggesting right atrial pressure of 3 mmHg.    Physical Exam:   VS:  BP 118/76 (BP Location: Left Arm, Cuff Size: Large)   Pulse 96   Ht 5\' 3"  (1.6 m)   Wt 226 lb (102.5 kg)   LMP 05/19/2012   SpO2 96%   BMI 40.03 kg/m    Wt Readings from Last 3 Encounters:  11/10/23 226 lb (102.5 kg)  09/28/23 224 lb 3.2 oz (101.7 kg)  07/01/23 213 lb (96.6 kg)     GEN: Well nourished, well developed female appearing in no acute distress NECK: No JVD; No carotid bruits CARDIAC: RRR, no murmurs, rubs, gallops RESPIRATORY:  Clear to auscultation without rales, wheezing or rhonchi  ABDOMEN: Appears non-distended. No obvious abdominal masses. EXTREMITIES: No clubbing or cyanosis. Trace lower extremity edema.  Distal pedal pulses are 2+ bilaterally.   Assessment and Plan:   1. Chronic HFimpEF/NICM - Recent echocardiogram showed her EF was mildly reduced at 45%. She only has trace edema on examination today and denies any recent respiratory issues. As discussed above, she has only been taking Coreg 3.125 mg twice daily and will titrate this to 6.25 mg twice daily. She was previously on a much higher dose  but suspect she would be unable to tolerate her prior dose of 25 mg twice daily given her significant weight loss in the interim (has lost over 100 pounds within the past few years due to dietary changes and since being on Ozempic). Will continue Lasix 40 mg daily, Jardiance 25 mg daily, Losartan 12.5 mg daily and Spironolactone 12.5 mg twice daily. Will arrange for follow-up in 6 to 8 weeks and likely titrate Losartan at that time if BP allows.  2. HTN - Blood pressure is well-controlled at 118/76 during today's visit. Will titrate Coreg to 6.25 mg twice daily as discussed above. Continue Losartan 12.5 mg daily and Spironolactone 12.5 mg twice daily.  3. HLD - Followed by her PCP. Continue current medical therapy with Atorvastatin 40 mg daily.  4. OSA - Continued compliance with CPAP encouraged.   Signed, Ellsworth Lennox, PA-C

## 2023-11-17 ENCOUNTER — Telehealth: Payer: Self-pay | Admitting: Cardiology

## 2023-11-17 MED ORDER — LOSARTAN POTASSIUM 25 MG PO TABS: 12.5000 mg | ORAL_TABLET | Freq: Every day | ORAL | 3 refills | Status: DC

## 2023-11-17 NOTE — Telephone Encounter (Signed)
*  STAT* If patient is at the pharmacy, call can be transferred to refill team.   1. Which medications need to be refilled? (please list name of each medication and dose if known) losartan (COZAAR) 25 MG tablet    2. Would you like to learn more about the convenience, safety, & potential cost savings by using the [facility]?      3. Are you open to using the [facility] (Type [facility]. ).   4. Which pharmacy/location (including street and city if local pharmacy) is medication to be sent to? [facility] - [place], [place] - [address]    5. Do they need a 30 day or 90 day supply? 90

## 2023-11-17 NOTE — Telephone Encounter (Signed)
 Medication refill completed- sent to [facility] per pt's request.

## 2023-11-30 ENCOUNTER — Other Ambulatory Visit: Payer: Self-pay

## 2023-11-30 MED ORDER — LOSARTAN POTASSIUM 25 MG PO TABS: 12.5000 mg | ORAL_TABLET | Freq: Every day | ORAL | 3 refills | Status: DC

## 2023-12-06 ENCOUNTER — Other Ambulatory Visit: Payer: Self-pay | Admitting: Internal Medicine

## 2023-12-06 DIAGNOSIS — Z1231 Encounter for screening mammogram for malignant neoplasm of breast: Secondary | ICD-10-CM

## 2023-12-27 ENCOUNTER — Encounter: Payer: Self-pay | Admitting: Physician Assistant

## 2023-12-27 ENCOUNTER — Ambulatory Visit: Attending: Physician Assistant | Admitting: Physician Assistant

## 2023-12-27 VITALS — BP 124/80 | HR 96 | Ht 64.0 in | Wt 221.0 lb

## 2023-12-27 DIAGNOSIS — I10 Essential (primary) hypertension: Secondary | ICD-10-CM

## 2023-12-27 DIAGNOSIS — E785 Hyperlipidemia, unspecified: Secondary | ICD-10-CM | POA: Diagnosis not present

## 2023-12-27 DIAGNOSIS — I5022 Chronic systolic (congestive) heart failure: Secondary | ICD-10-CM

## 2023-12-27 DIAGNOSIS — E079 Disorder of thyroid, unspecified: Secondary | ICD-10-CM | POA: Diagnosis not present

## 2023-12-27 NOTE — Patient Instructions (Addendum)
 Please call our office when you have a chance to verify your home medication regimen. We are particularly looking to check whether you are on lisinopril  or losartan .  Please speak with the prescriber of your colchicine to discuss whether you can go off this or go to rare as-needed use only, since this can interact with both your carvedilol  and atorvastatin .  Medication Instructions:  Your physician recommends that you continue on your current medications as directed. Please refer to the Current Medication list given to you today.  *If you need a refill on your cardiac medications before your next appointment, please call your pharmacy*  Lab Work: Your physician recommends that you return for lab work in. ( BMET, CBC, TSH, T3, T4)   If you have labs (blood work) drawn today and your tests are completely normal, you will receive your results only by: MyChart Message (if you have MyChart) OR A paper copy in the mail If you have any lab test that is abnormal or we need to change your treatment, we will call you to review the results.  Testing/Procedures: Your physician has requested that you have an echocardiogram. Echocardiography is a painless test that uses sound waves to create images of your heart. It provides your doctor with information about the size and shape of your heart and how well your heart's chambers and valves are working. This procedure takes approximately one hour. There are no restrictions for this procedure. Please do NOT wear cologne, perfume, aftershave, or lotions (deodorant is allowed). Please arrive 15 minutes prior to your appointment time.  Please note: We ask at that you not bring children with you during ultrasound (echo/ vascular) testing. Due to room size and safety concerns, children are not allowed in the ultrasound rooms during exams. Our front office staff cannot provide observation of children in our lobby area while testing is being conducted. An adult  accompanying a patient to their appointment will only be allowed in the ultrasound room at the discretion of the ultrasound technician under special circumstances. We apologize for any inconvenience.  Thyroid  US    Follow-Up: At [facility], you and your health needs are our priority.  As part of our continuing mission to provide you with exceptional heart care, our providers are all part of one team.  This team includes your primary Cardiologist (physician) and Advanced Practice Providers or APPs (Physician Assistants and Nurse Practitioners) who all work together to provide you with the care you need, when you need it.  Your next appointment:   2 month(s)  Provider:   Woodfin Hays, PA-C or Dayna Dunn, PA-C      We recommend signing up for the patient portal called "MyChart".  Sign up information is provided on this After Visit Summary.  MyChart is used to connect with patients for Virtual Visits (Telemedicine).  Patients are able to view lab/test results, encounter notes, upcoming appointments, etc.  Non-urgent messages can be sent to your provider as well.   To learn more about what you can do with MyChart, go to [email or web address].   Other Instructions Thank you for choosing  [facility]!

## 2023-12-27 NOTE — Progress Notes (Signed)
 Cardiology Office Note    Date:  12/27/2023  ID:  Bridget Little, DOB 05-11-1961, MRN [id number] PCP:  Bridget Blonder, DO  Cardiologist:  Bridget Lander, MD  Electrophysiologist:  None   Chief Complaint: f/u CHF  History of Present Illness: Bridget Little    Bridget Little is a 63 y.o. female with visit-pertinent history of chronic HFimpEF/NICM (EF 25-30% in 2015 with cath showing patent cors, EF normalized to 60-65% by repeat imaging and at 45% by echocardiogram in 06/2023), HTN, HLD, OSA, suspected CKD 3a by labs, thyroid  abnormality on CT, tendency for baseline sinus tachycardia seen for follow-up.  She was diagnosed with HF in 2015 with EF 25-30% with cath showing normal coronary arteries at that time. She also was being treated for URI and likely had undiagnosed OSA at that time. She was started on GDMT with antihypertensive titration. Repeat echo 02/2014 showed normalization of EF to 60-65%. Sleep study ultimately showed moderate OSA. There was some limitation getting her CPAP initially but eventually obtained and she is now faithful with use. She was admitted 03/2023 for weakness and poor appetite, found to have Covid PNA and AKI prompting IVF and medication adjustment. Repeat surveillance echo 06/2024 showed EF 45%, G1DD, no RWMA. Losartan  was added in 09/2023 and carvedilol  was increased to 6.25mg  BID at 10/2023 OV. It was otherwise felt that BP may limit aggressive GDMT. No fam hx of CHF or SCD.  She returns for follow-up today feeling great. She denies any CP, SOB, orthopnea, palpitations or syncope. She brings in a log of BPs, several of which are well controlled but occasional outliers >130 systolic. She has trace LE edema which she reports is chronic. She reports her weight has been stable at home - continues with slow gradual weight loss on GLP1.   It is noted that she has both lisinopril  and losartan  on med list. She will have to check at home which she is taking. She also reports she  takes colchicine daily, not PRN.  Labwork independently reviewed: 10/2023 K 4.1, Cr 1.21 similar to prior 04/2023 LFTs ok 03/2023 H/H/plt OK, TSH OK 08/2022 PCP lipids LDL 53, trig 165  ROS: .    Please see the history of present illness.  All other systems are reviewed and otherwise negative.  Studies Reviewed: Bridget Little    EKG:  EKG is ordered today, personally reviewed, demonstrating NSR 99bpm, low voltage QRS, nonspecific STTW changes, similar to prior  CV Studies: Cardiac studies reviewed are outlined and summarized above. Otherwise please see EMR for full report.   Current Reported Medications:.    Current Meds  Medication Sig   allopurinol (ZYLOPRIM) 100 MG tablet Take 100 mg by mouth 2 (two) times daily.   aspirin  81 MG tablet Take 81 mg by mouth daily.   atorvastatin  (LIPITOR) 40 MG tablet Take 40 mg by mouth daily.   Calcium  Carb-Cholecalciferol (CALCIUM  HIGH POTENCY/VITAMIN D) 600-5 MG-MCG TABS Take 1 tablet by mouth daily.   carvedilol  (COREG ) 6.25 MG tablet Take 1 tablet (6.25 mg total) by mouth 2 (two) times daily with a meal.   colchicine 0.6 MG tablet Take 0.6-1.2 mg by mouth See admin instructions. Take 1.2 mg as needed at onset of gout flare may take an additional 0.6 mg an hour later as needed. Do not repeat for 3 days   empagliflozin (JARDIANCE) 25 MG TABS tablet Take 25 mg by mouth daily.   EPINEPHrine  (EPI-PEN) 0.3 mg/0.3 mL SOAJ injection Inject 0.3 mg into  the muscle as needed for anaphylaxis.   furosemide  (LASIX ) 40 MG tablet Take 40 mg by mouth daily.   gabapentin (NEURONTIN) 300 MG capsule Take 300 mg by mouth 3 (three) times daily.    glipiZIDE (GLUCOTROL) 10 MG tablet Take 10 mg by mouth 2 (two) times daily.   Krill Oil 1000 MG CAPS Take 1,000 mg by mouth daily.   lisinopril  (ZESTRIL ) 40 MG tablet Take 40 mg by mouth daily.   metFORMIN (GLUCOPHAGE) 1000 MG tablet Take 1 tablet by mouth 2 (two) times daily with a meal.   NON FORMULARY Pt uses a c-pap nightly    PREVIDENT 5000 DRY MOUTH 1.1 % GEL dental gel Place 1 Application onto teeth at bedtime.   Semaglutide, 2 MG/DOSE, (OZEMPIC, 2 MG/DOSE,) 8 MG/3ML SOPN Inject 2 mg into the skin every Sunday.   silver sulfADIAZINE (SILVADENE) 1 % cream Apply 1 application. topically daily as needed (diabetic related sores).   SODIUM FLUORIDE 5000 SENSITIVE 1.1-5 % GEL 2 (two) times daily.   spironolactone  (ALDACTONE ) 25 MG tablet Take 0.5 tablets (12.5 mg total) by mouth 2 (two) times daily.   tretinoin (RETIN-A) 0.025 % cream Apply 1 Application topically at bedtime as needed (acne). Pea-sized amount to entire face at bedtime.    Physical Exam:    VS:  BP 124/80 (BP Location: Left Arm, Cuff Size: Large)   Pulse 96   Ht 5\' 4"  (1.626 m)   Wt 221 lb (100.2 kg)   LMP 05/19/2012   SpO2 94%   BMI 37.93 kg/m    Wt Readings from Last 3 Encounters:  12/27/23 221 lb (100.2 kg)  11/10/23 226 lb (102.5 kg)  09/28/23 224 lb 3.2 oz (101.7 kg)    GEN: Well nourished, well developed in no acute distress NECK: No JVD; No carotid bruits. Bilateral thyroid  gland enlargement CARDIAC: RRR, no murmurs, rubs, gallops RESPIRATORY:  Clear to auscultation without rales, wheezing or rhonchi  ABDOMEN: Soft, non-tender, non-distended EXTREMITIES:  Trace BLE edema; No acute deformity   Asessement and Plan:.    1. Chronic HFmrEF, NICM - etiology of NICM not entirely clear, question HTN related, also had URI around the time of initial diagnosis as well as Covid in the months before recurrent drop in EF 06/2023. We need her to clarify her home med list before making further adjustments since she has both lisinopril  and losartan  on her medicine list. Given her tendency for baseline borderline inappropriate sinus tach (going back many years per chart review), would favor titration of carvedilol  further as we are able. Update CBC/BMET and thyroid  labs. Will also repeat echo in mid June to reassess LVEF. If this is still down, will need  to consider additional evaluation for etiology. She denies any ischemic symptoms whatsoever. Volume otherwise is stable.   2. Essential HTN - manage in context above.  3. Hyperlipidemia - she reports lipids are managed by PCP. She does report that she takes colchicine daily. I told her to please reach out to the person that prescribes this medication to discuss whether she can come off of this back to PRN use since this can interact with her atorvastatin  as well as carvedilol .  4. Palpable abnormality of thyroid  gland - previous thyroid  abnormality noted on several prior CTs. She denies ever having this worked up. Will get basic thyroid  labs as well as thyroid  US .      Disposition: F/u with me or Woodfin Hays in 2 months, anticipate continued med titration as  we are able.  Signed, Kelso Bibby N Marypat Kimmet, PA-C

## 2023-12-28 ENCOUNTER — Telehealth: Payer: Self-pay | Admitting: Physician Assistant

## 2023-12-28 NOTE — Telephone Encounter (Signed)
 Spoke with pt who states that she is currently taking Lisinopril  40 mg daily, Losartan  12.5 mg Daily and colchicine 0.6 mg PRN. Please advise.

## 2023-12-28 NOTE — Telephone Encounter (Signed)
 At OV yesterday patient thinks PCP started lisinopril  but we do not know chronicity.  Please also double check sig on Lasix , Jardiance, spironolactone  to make sure we have these accurate on her list.  Please: - discontinue losartan  - increase carvedilol  to 12.5mg  BID - send in BP log, HR 1 week after making this change - remind her to return for labs requested at OV yesterday - if EF remains suboptimal by f/u echo ordered, we will look to change lisinopril  to Entresto going forward   Notify of any concerning symptoms between now and next follow-up. Bring all med bottles to next OV.

## 2023-12-28 NOTE — Telephone Encounter (Signed)
 Follow Up:     Patient said she saw Dayna yesterday and was supposed to call  back with the correct medicine and directions.   Pt c/o medication issue:  1. Name of Medication: Lisinopril  40 mg  2. How are you currently taking this medication (dosage and times per day)? 1 tablet every morning  3. Are you having a reaction (difficulty breathing--STAT)?   4. What is your medication issue?    Pt c/o medication issue:  1. Name of Medication: Losartan  25 mg  2. How are you currently taking this medication (dosage and times per day)? Take 125 mg daily  3. Are you having a reaction (difficulty breathing--STAT)?   4. What is your medication issue?   Pt c/o medication issue:  1. Name of Medication: Colchidine 0.6 mg  2. How are you currently taking this medication (dosage and times per day)? Take 2 tablets when she has gout, if not better in an hour take 1  tablet she can not take this dose nore until 3 days later t  3. Are you having a reaction (difficulty breathing--STAT)?   4. What is your medication issue?

## 2023-12-29 MED ORDER — CARVEDILOL 12.5 MG PO TABS: 12.5000 mg | ORAL_TABLET | Freq: Two times a day (BID) | ORAL | 3 refills | Status: AC

## 2023-12-29 NOTE — Telephone Encounter (Signed)
 Spoke with pt who states Skariah, DO wrote for the Lasix , Jardiance, and Spironolactone . Lasix , Jardiance and Spironolactone  sigs are correct in the computer. Pt aware that she needs to have labs done, and the increase in Coreg  to 12.5 mg. She will monitor BP and call with the recordings.

## 2023-12-30 ENCOUNTER — Ambulatory Visit: Admitting: Student

## 2024-01-02 ENCOUNTER — Encounter

## 2024-01-05 ENCOUNTER — Ambulatory Visit (HOSPITAL_COMMUNITY)
Admission: RE | Admit: 2024-01-05 | Discharge: 2024-01-05 | Disposition: A | Discharge status: Home / Self Care | Source: Ambulatory Visit | Attending: Physician Assistant | Admitting: Physician Assistant

## 2024-01-05 ENCOUNTER — Other Ambulatory Visit (HOSPITAL_COMMUNITY)
Admission: RE | Admit: 2024-01-05 | Discharge: 2024-01-05 | Disposition: A | Discharge status: Home / Self Care | Source: Ambulatory Visit | Attending: Physician Assistant | Admitting: Physician Assistant

## 2024-01-05 ENCOUNTER — Ambulatory Visit: Payer: Self-pay | Admitting: Cardiology

## 2024-01-05 DIAGNOSIS — E042 Nontoxic multinodular goiter: Secondary | ICD-10-CM | POA: Diagnosis not present

## 2024-01-05 DIAGNOSIS — I5022 Chronic systolic (congestive) heart failure: Secondary | ICD-10-CM | POA: Diagnosis not present

## 2024-01-05 DIAGNOSIS — E079 Disorder of thyroid, unspecified: Secondary | ICD-10-CM | POA: Diagnosis present

## 2024-01-05 DIAGNOSIS — I110 Hypertensive heart disease with heart failure: Secondary | ICD-10-CM | POA: Diagnosis not present

## 2024-01-05 LAB — CBC
HCT: 41 % (ref 36.0–46.0)
Hemoglobin: 13.2 g/dL (ref 12.0–15.0)
MCH: 27.2 pg (ref 26.0–34.0)
MCHC: 32.2 g/dL (ref 30.0–36.0)
MCV: 84.5 fL (ref 80.0–100.0)
Platelets: 334 10*3/uL (ref 150–400)
RBC: 4.85 MIL/uL (ref 3.87–5.11)
RDW: 18.3 % — ABNORMAL HIGH (ref 11.5–15.5)
WBC: 9.2 10*3/uL (ref 4.0–10.5)
nRBC: 0 % (ref 0.0–0.2)

## 2024-01-05 LAB — BASIC METABOLIC PANEL WITH GFR
Anion gap: 11 (ref 5–15)
BUN: 23 mg/dL (ref 8–23)
CO2: 24 mmol/L (ref 22–32)
Calcium: 9.4 mg/dL (ref 8.9–10.3)
Chloride: 106 mmol/L (ref 98–111)
Creatinine, Ser: 0.92 mg/dL (ref 0.44–1.00)
GFR, Estimated: >60 mL/min (ref >60)
Glucose, Bld: 80 mg/dL (ref 70–99)
Potassium: 4 mmol/L (ref 3.5–5.1)
Sodium: 141 mmol/L (ref 135–145)

## 2024-01-05 LAB — TSH: TSH: 0.671 u[IU]/mL (ref 0.350–4.500)

## 2024-01-06 LAB — T4: T4, Total: 6.9 ug/dL (ref 4.5–12.0)

## 2024-01-06 LAB — T3: T3, Total: 83 ng/dL (ref 71–180)

## 2024-01-10 ENCOUNTER — Ambulatory Visit
Admission: RE | Admit: 2024-01-10 | Discharge: 2024-01-10 | Disposition: A | Discharge status: Home / Self Care | Source: Ambulatory Visit | Attending: Internal Medicine | Admitting: Internal Medicine

## 2024-01-10 DIAGNOSIS — Z1231 Encounter for screening mammogram for malignant neoplasm of breast: Secondary | ICD-10-CM | POA: Diagnosis present

## 2024-01-11 ENCOUNTER — Ambulatory Visit: Payer: Self-pay | Admitting: Physician Assistant

## 2024-01-11 DIAGNOSIS — E049 Nontoxic goiter, unspecified: Secondary | ICD-10-CM

## 2024-01-12 NOTE — Telephone Encounter (Signed)
-----   Message from Dayna N Dunn sent at 01/11/2024  7:41 AM EDT ----- Please let pt know that her thyroid  ultrasound confirmed there is enlargement present along with some nodules that will require surveillance in the future. Please refer to endocrinology for further management of thyroid  goiter.

## 2024-01-12 NOTE — Telephone Encounter (Signed)
 The patient has been notified of the result and verbalized understanding.  All questions (if any) were answered. Casper Clement, CMA 01/12/2024 8:44 AM

## 2024-02-06 ENCOUNTER — Ambulatory Visit (HOSPITAL_COMMUNITY)
Admission: RE | Admit: 2024-02-06 | Discharge: 2024-02-06 | Disposition: A | Discharge status: Home / Self Care | Source: Ambulatory Visit | Attending: Physician Assistant | Admitting: Physician Assistant

## 2024-02-06 DIAGNOSIS — I5022 Chronic systolic (congestive) heart failure: Secondary | ICD-10-CM | POA: Diagnosis present

## 2024-02-06 DIAGNOSIS — I517 Cardiomegaly: Secondary | ICD-10-CM

## 2024-02-06 LAB — ECHOCARDIOGRAM COMPLETE
Area-P 1/2: 7.44 cm2
Est EF: 45
S' Lateral: 4 cm

## 2024-02-06 NOTE — Progress Notes (Signed)
*  PRELIMINARY RESULTS* Echocardiogram 2D Echocardiogram has been performed.  Bridget Little 02/06/2024, 11:10 AM

## 2024-02-17 ENCOUNTER — Telehealth: Payer: Self-pay | Admitting: Cardiology

## 2024-02-17 NOTE — Telephone Encounter (Signed)
 Patient called to report BP readings as requested.  6/23 - 131/70  HR 93 6/24 - 113/58  HR 85 6/25, around 9:00 am - 107/34  HR 91 6/25, 2:00 pm -  117/33  HR 99 6/26, 9:00 am - 111/52  HR 85 6/26, 3:00 pm - 128/49  HR 107 6/27, 9:00 am - 110/70  HR 91

## 2024-02-20 NOTE — Telephone Encounter (Signed)
 Bridget Little, Bridget N, PA-C    12/28/23  2:28 PM Note At OV yesterday patient thinks PCP started lisinopril  but we do not know chronicity.  Please also double check sig on Lasix , Jardiance, spironolactone  to make sure we have these accurate on her list.   Please: - discontinue losartan  - increase carvedilol  to 12.5mg  BID - send in BP log, HR 1 week after making this change - remind her to return for labs requested at OV yesterday - if EF remains suboptimal by f/u echo ordered, we will look to change lisinopril  to Entresto going forward    Notify of any concerning symptoms between now and next follow-up. Bring all med bottles to next OV.           Pinnix, Bridget G, LPN    11/21/72  4:89 PM Note Spoke with pt who states Skariah, DO wrote for the Lasix , Jardiance, and Spironolactone . Lasix , Jardiance and Spironolactone  sigs are correct in the computer. Pt aware that she needs to have labs done, and the increase in Coreg  to 12.5 mg. She will monitor BP and call with the recordings.            I encouraged Bridget Little to continue all her medications as her BP's are good, her heart rate is still too fast. She has f/u next week on 02/28/24

## 2024-02-20 NOTE — Telephone Encounter (Signed)
 Pt called back because she has not heard from anyone yet. She said to let nurse know she is not taking any of her BP medication until she comes in for her appt on 02/28/24

## 2024-02-28 ENCOUNTER — Ambulatory Visit: Attending: Student | Admitting: Student

## 2024-02-28 ENCOUNTER — Encounter: Payer: Self-pay | Admitting: Student

## 2024-02-28 VITALS — BP 108/74 | HR 96 | Ht 63.0 in | Wt 219.6 lb

## 2024-02-28 DIAGNOSIS — I10 Essential (primary) hypertension: Secondary | ICD-10-CM

## 2024-02-28 DIAGNOSIS — G4733 Obstructive sleep apnea (adult) (pediatric): Secondary | ICD-10-CM | POA: Diagnosis not present

## 2024-02-28 DIAGNOSIS — I5022 Chronic systolic (congestive) heart failure: Secondary | ICD-10-CM

## 2024-02-28 DIAGNOSIS — E049 Nontoxic goiter, unspecified: Secondary | ICD-10-CM

## 2024-02-28 DIAGNOSIS — E785 Hyperlipidemia, unspecified: Secondary | ICD-10-CM

## 2024-02-28 NOTE — Progress Notes (Signed)
 Cardiology Office Note    Date:  02/28/2024  ID:  DEVINN VOSHELL, DOB 1961/03/19, MRN [id number] Cardiologist: Alvan Carrier, MD    History of Present Illness:    Bridget Little is a 63 y.o. female with past medical history of chronic HFimpEF/NICM (EF 25-30% in 2015 with cath showing patent cors, EF normalized to 60-65% by repeat imaging and at 45% by echocardiogram in 06/2023), HTN, HLD and OSA who presents to the office today for 62-month follow-up.   She was last examined by Raphael Bring, PA in 12/2023 and reported overall feeling well at that time and denied any recent chest pain or shortness of breath. She was listed as taking both Lisinopril  and Losartan  and it was unclear which one she was taking at home. Follow-up labs were recommended along with a repeat echocardiogram in 01/2024. She called back and was taking both Lisinopril  40 mg daily and Losartan  12.5 mg daily, therefore it was recommended to discontinue Losartan  and increase Coreg  to 12.5 mg twice daily.  Imaging was also arranged for her thyroid  which showed a markedly enlarged, heterogeneous and multinodular thyroid  gland and most consistent with multinodular goiter. She was referred to Endocrinology for further evaluation and management.  Repeat echocardiogram was obtained in 01/2024 and showed her EF was at 45%.  RV function was normal and she did not have any significant valve abnormalities.  Was recommended to consider a cardiac MRI but she reported having a history of metal along her back.  Therefore, was recommended to consider PYP scan.  In talking the patient today, she reports overall feeling well since her last office visit. Her activity is limited due to back pain and she reports using a motorized wheelchair when at the grocery store or [place]. Uses a cane at home as needed. Denies any dyspnea on exertion, orthopnea or PND. Uses her CPAP on a nightly basis.  Does experience intermittent lower extremity edema and  takes an extra Lasix  tablet if needed. Her weight has actually declined by few pounds since her last visit and says this has been stable on her home scales. She denies any recent chest pain or palpitations. She brings with her today a blood pressure log and readings have overall been well-controlled when checked at home and heart rate has been in the 80's. BP is soft at 108/74 during today's visit but she denies any associated lightheadedness, dizziness or presyncope.  Studies Reviewed:   EKG: EKG is not ordered today.   Echocardiogram: 01/2024 IMPRESSIONS     1. Left ventricular ejection fraction, by estimation, is 45%. Left  ventricular ejection fraction by 3D volume is 47 %. The left ventricle has  mildly decreased function. Left ventricular endocardial border not  optimally defined to evaluate regional wall  motion. There is moderate asymmetric left ventricular hypertrophy of the  lateral segment. Indeterminate diastolic filling due to E-A fusion.   2. Right ventricular systolic function is normal. The right ventricular  size is normal. Tricuspid regurgitation signal is inadequate for assessing  PA pressure.   3. The mitral valve is normal in structure. No evidence of mitral valve  regurgitation. No evidence of mitral stenosis.   4. The aortic valve has an indeterminant number of cusps. Aortic valve  regurgitation is not visualized. No aortic stenosis is present.   5. The inferior vena cava is normal in size with greater than 50%  respiratory variability, suggesting right atrial pressure of 3 mmHg.   Comparison(s): No significant change  from prior study.    Physical Exam:   VS:  BP 108/74 (BP Location: Left Arm, Patient Position: Sitting)   Pulse 96   Ht 5' 3 (1.6 m)   Wt 219 lb 9.6 oz (99.6 kg)   LMP 05/19/2012   SpO2 94%   BMI 38.90 kg/m    Wt Readings from Last 3 Encounters:  02/28/24 219 lb 9.6 oz (99.6 kg)  12/27/23 221 lb (100.2 kg)  11/10/23 226 lb (102.5 kg)      GEN: Well nourished, well developed female appearing in no acute distress NECK: No JVD; No carotid bruits CARDIAC: RRR, no murmurs, rubs, gallops RESPIRATORY:  Clear to auscultation without rales, wheezing or rhonchi  ABDOMEN: Appears non-distended. No obvious abdominal masses. EXTREMITIES: No clubbing or cyanosis. Trace ankle edema bilaterally.  Distal pedal pulses are 2+ bilaterally.   Assessment and Plan:   1. Chronic heart failure with mildly reduced ejection fraction (HFmrEF, 41-49%) (HCC) - Recent echocardiogram showed her EF was mildly reduced at 45% and she has a history of known NICM.  Unable to have a cardiac MRI due to metal along her spinal column. Can possibly arrange for PYP scan once imaging material for these are again available and reviewed this with the patient today. - She reports much improvement in her symptoms and denies any recent respiratory issues at this time. Her weight continues to decline as well with dietary changes and the use of Ozempic. She prefers to continue current medical therapy for now since overall feeling well and will continue GDMT with Coreg  12.5 mg twice daily, Jardiance 25 mg daily (on this dose due to Type II DM), Lasix  40 mg daily, Lisinopril  40 mg daily and Spironolactone  12.5 mg daily.  Would obtain a repeat limited echocardiogram in approximately 6 months for reassessment. If EF remains reduced and BP allows, would then switch Lisinopril  to Entresto following a 36-hour washout.   2. Essential hypertension - BP is low-normal at 108/74 during today's visit but she denies any associated symptoms. Continue current medical therapy for now with Coreg  12.5 mg twice daily, Lasix  40 mg daily, Lisinopril  40 mg daily and Spironolactone  12.5 mg daily.  3. Hyperlipidemia, unspecified hyperlipidemia type - Followed by PCP. Continue Atorvastatin  40mg  daily.   4. OSA (obstructive sleep apnea) - Continued compliance with CPAP encouraged.   5. Thyroid   Goiter - Recent ultrasound in 12/2023 showed a multinodular goiter and multiple thyroid  nodules with repeat imaging recommended in 1 year for reassessment. TSH and Free T3/T4 were normal at that time. She was previously referred to Endocrinology and has follow-up in 04/2024.  Signed, Laymon CHRISTELLA Qua, PA-C

## 2024-02-28 NOTE — Patient Instructions (Signed)
Medication Instructions:  ?Your physician recommends that you continue on your current medications as directed. Please refer to the Current Medication list given to you today. ? ? ?Labwork: ?None today ? ?Testing/Procedures: ?None today ? ?Follow-Up: ?3-4 months Dr.Branch ? ?Any Other Special Instructions Will Be Listed Below (If Applicable). ? ?If you need a refill on your cardiac medications before your next appointment, please call your pharmacy. ? ?

## 2024-03-01 ENCOUNTER — Telehealth: Payer: Self-pay | Admitting: Cardiology

## 2024-03-01 NOTE — Telephone Encounter (Signed)
 Medication was stopped on 12/29/23 and patient states she will remove from her medication cabinet and she will read her AVS after visits

## 2024-03-01 NOTE — Telephone Encounter (Signed)
 Pt c/o medication issue:  1. Name of Medication:   losartan  (COZAAR ) 25 MG tablet [[id number]]   2. How are you currently taking this medication (dosage and times per day)?   As prescribed  3. Are you having a reaction (difficulty breathing--STAT)?   4. What is your medication issue?   Patient stated she has continued to take this medication and wants to know if she still needs to be taking this medication.

## 2024-03-02 ENCOUNTER — Ambulatory Visit: Admitting: Student

## 2024-05-02 NOTE — Patient Instructions (Signed)
Goiter  A goiter is an enlarged thyroid gland. The thyroid gland is located in the lower front part of the neck, just in front of the windpipe (trachea). This gland makes hormones that affect how the body processes food for energy (metabolism) and how the heart and brain function. Most goiters are painless and are not a cause for concern. Some goiters can affect the way your thyroid makes thyroid hormones. Goiters and conditions that cause goiters can be treated, if necessary. What are the causes? This condition may be caused by: Lack of a mineral called iodine. The thyroid gland uses iodine to make thyroid hormones. Diseases that attack healthy cells in the body (autoimmune diseases) and affect thyroid function, such as Graves' disease or Hashimoto's disease. These diseases may cause the body to produce too much thyroid hormone (hyperthyroidism) or too little of the hormone (hypothyroidism). Conditions that cause inflammation of the thyroid (thyroiditis). One or more small growths on the thyroid (nodular goiter). Other causes may include: Medical problems caused by abnormal genes that are passed from parent to child (genetic defects). Thyroid injury or infection. Tumors that may or may not be cancerous. Pregnancy. Certain medicines. Exposure to radiation. In some cases, the cause may not be known. What increases the risk? The following factors may make you more likely to develop this condition: You do not get enough iodine in your diet. You have a family history of goiter. You are female. You are older than age 40. You smoke tobacco. You have had exposure to radiation. What are the signs or symptoms? The main symptom of this condition is swelling in the lower, front part of the neck. This swelling can range from a very small bump to a large lump. Other symptoms may include: A tight feeling in the throat. A hoarse voice. Coughing. Wheezing. Difficulty swallowing or breathing. Bulging  veins in the neck. Dizziness. When a goiter is the result of an overactive thyroid (hyperthyroidism), symptoms may also include: Nervousness or restlessness. Inability to tolerate heat. Unexplained weight loss. Diarrhea. Changes in heartbeat, such as skipped beats, extra beats, or a rapid heart rate. Loss of menstruation. Increased appetite. Sleep problems. When a goiter is the result of an underactive thyroid (hypothyroidism), symptoms may also include: Feeling tired (fatigue). Inability to tolerate cold. Weight gain that is not explained by a change in diet or exercise habits. Dry skin or coarse hair. Irregular menstrual periods. Constipation. Sadness or depression. In some cases, there may not be any symptoms. How is this diagnosed? This condition may be diagnosed based on your symptoms, your medical history, and a physical exam. You may have tests, such as: Blood tests to check thyroid function. Imaging tests, such as: Ultrasound. CT scan. MRI. Thyroid scan. Removal of a tissue sample (biopsy) of the goiter or any nodules. The sample will be tested to check for cancer. How is this treated? Treatment for this condition depends on the cause and your symptoms. Treatment may include: Medicines to regulate thyroid hormone levels. Anti-inflammatory medicines or steroid medicines, if the goiter is caused by inflammation. Iodine supplements or changes to your diet, if the goiter is caused by iodine deficiency. Radioactive iodine treatment. Surgery to remove your thyroid. In some cases, you may only need regular check-ups with your health care provider to monitor your condition, and you may not need treatment. Follow these instructions at home: Follow instructions from your health care provider about any changes to your diet. Take over-the-counter and prescription medicines only as told   by your health care provider. These include supplements. Do not use any products that contain  nicotine or tobacco. These products include cigarettes, chewing tobacco, and vaping devices, such as e-cigarettes. If you need help quitting, ask your health care provider. Keep all follow-up visits. Your health care provider will want to repeat blood tests to check thyroid function. Where to find more information American Thyroid Association: thyroid.org Endocrine Society: endocrine.org Contact a health care provider if: Your symptoms do not get better with treatment. You have nausea, vomiting, or diarrhea. You have a fever. You suddenly become very weak. You experience extreme restlessness. Get help right away if: You have sudden, unexplained confusion or other mental changes. You have chest pain. You have trouble breathing or swallowing. You have fast or irregular heartbeats (palpitations). These symptoms may be an emergency. Get help right away. Call 911. Do not wait to see if the symptoms will go away. Do not drive yourself to the hospital. Summary A goiter is an enlarged thyroid gland. The thyroid gland is located in the lower front part of the neck, just in front of the windpipe. The main symptom of this condition is swelling in the lower, front part of the neck. This swelling can range from a very small bump to a large lump. Treatment for this condition depends on the cause and your symptoms. You may need medicines, supplements, or regular monitoring of your condition. This information is not intended to replace advice given to you by your health care provider. Make sure you discuss any questions you have with your health care provider. Document Revised: 10/02/2021 Document Reviewed: 10/02/2021 Elsevier Patient Education  2024 [employer].

## 2024-05-03 ENCOUNTER — Ambulatory Visit (INDEPENDENT_AMBULATORY_CARE_PROVIDER_SITE_OTHER): Payer: Self-pay | Admitting: Nurse Practitioner

## 2024-05-03 ENCOUNTER — Encounter: Payer: Self-pay | Admitting: Nurse Practitioner

## 2024-05-03 VITALS — BP 108/74 | HR 99 | Ht 64.0 in | Wt 220.0 lb

## 2024-05-03 DIAGNOSIS — E042 Nontoxic multinodular goiter: Secondary | ICD-10-CM

## 2024-05-03 NOTE — Progress Notes (Signed)
 Endocrinology Consult Note 05/03/24    ---------------------------------------------------------------------------------------------------------------------- Subjective    Past Medical History:  Diagnosis Date   CHF (congestive heart failure) (HCC)    a. EF 25-30% in 2015 with cath showing patent cors b. EF normalized to 60-65% by repeat imaging. C. EF at 45% by echocardiogram in 06/2023   Chronic back pain    Diabetes mellitus, type II (HCC)    Essential hypertension, benign    Hypercholesteremia    Neuromuscular disorder (HCC)    right foot   Sleep apnea     Past Surgical History:  Procedure Laterality Date   BACK SURGERY     Lumbar fusion   COLONOSCOPY  06/02/2012   Incomplete colonoscopy due to redundant colon.   COLONOSCOPY WITH PROPOFOL  N/A 12/14/2021   Procedure: COLONOSCOPY WITH PROPOFOL ;  Surgeon: Cindie Carlin POUR, DO;  Location: [facility];  Service: Endoscopy;  Laterality: N/A;  2:00pm   HYSTEROSCOPY WITH D & C N/A 07/05/2023   Procedure: DILATATION AND CURETTAGE /HYSTEROSCOPY; MIRENA  IUD INSERTION;  Surgeon: Marilynn Nest, DO;  Location: [facility];  Service: Gynecology;  Laterality: N/A;   LEFT AND RIGHT HEART CATHETERIZATION WITH CORONARY ANGIOGRAM N/A 10/24/2013   Procedure: LEFT AND RIGHT HEART CATHETERIZATION WITH CORONARY ANGIOGRAM;  Surgeon: Candyce GORMAN Reek, MD;  Location: [facility] CATH LAB;  Service: Cardiovascular;  Laterality: N/A;   POLYPECTOMY  12/14/2021   Procedure: POLYPECTOMY;  Surgeon: Cindie Carlin POUR, DO;  Location: [facility];  Service: Endoscopy;;   TUBAL LIGATION      Social History   Socioeconomic History   Marital status: Married    Spouse name: Not on file   Number of children: 2   Years of education: Not on file   Highest education level: Not on file  Occupational History   Not on file  Tobacco Use   Smoking status: Never   Smokeless tobacco: Never  Vaping Use   Vaping status: Never Used  Substance and Sexual Activity    Alcohol  use: Not Currently    Comment: very seldom   Drug use: No   Sexual activity: Yes    Birth control/protection: Surgical    Comment: tubal  Other Topics Concern   Not on file  Social History Narrative   Not on file   Social Drivers of Health   Financial Resource Strain: Low Risk  (09/26/2023)   Received from [facility]   Overall Financial Resource Strain (CARDIA)    Difficulty of Paying Living Expenses: Not hard at all  Food Insecurity: No Food Insecurity (09/26/2023)   Received from [facility]   Hunger Vital Sign    Within the past 12 months, you worried that your food would run out before you got the money to buy more.: Never true    Within the past 12 months, the food you bought just didn't last and you didn't have money to get more.: Never true  Transportation Needs: No Transportation Needs (09/26/2023)   Received from [facility] - Transportation    Lack of Transportation (Medical): No    Lack of Transportation (Non-Medical): No  Physical Activity: Insufficiently Active (09/26/2023)   Received from [facility]   Exercise Vital Sign    On average, how many days per week do you engage in moderate to strenuous exercise (like a brisk walk)?: 2 days    On average, how many minutes do you engage in exercise at this level?: 20 min  Stress:  Stress Concern Present (09/26/2023)   Received from [facility] - Occupational Stress Questionnaire    Feeling of Stress : To some extent  Social Connections: Socially Integrated (09/26/2023)   Received from [facility]   Social Connection and Isolation Panel    In a typical week, how many times do you talk on the phone with family, friends, or neighbors?: More than three times a week    How often do you get together with friends or relatives?: More than three times a week    How often do you attend church or religious services?: More than 4 times per year    Do you  belong to any clubs or organizations such as church groups, unions, fraternal or athletic groups, or school groups?: Yes    How often do you attend meetings of the clubs or organizations you belong to?: More than 4 times per year    Are you married, widowed, divorced, separated, never married, or living with a partner?: Married  Intimate Partner Violence: Not At Risk (09/26/2023)   Received from [facility]   Humiliation, Afraid, Rape, and Kick questionnaire    Within the last year, have you been afraid of your partner or ex-partner?: No    Within the last year, have you been humiliated or emotionally abused in other ways by your partner or ex-partner?: No    Within the last year, have you been kicked, hit, slapped, or otherwise physically hurt by your partner or ex-partner?: No    Within the last year, have you been raped or forced to have any kind of sexual activity by your partner or ex-partner?: No    Current Outpatient Medications on File Prior to Visit  Medication Sig Dispense Refill   allopurinol (ZYLOPRIM) 100 MG tablet Take 100 mg by mouth 2 (two) times daily.     aspirin  81 MG tablet Take 81 mg by mouth daily.     atorvastatin  (LIPITOR) 40 MG tablet Take 40 mg by mouth daily.     Calcium  Carb-Cholecalciferol (CALCIUM  HIGH POTENCY/VITAMIN D) 600-5 MG-MCG TABS Take 1 tablet by mouth daily.     carvedilol  (COREG ) 12.5 MG tablet Take 1 tablet (12.5 mg total) by mouth 2 (two) times daily. 180 tablet 3   colchicine 0.6 MG tablet Take 0.6-1.2 mg by mouth See admin instructions. Take 1.2 mg as needed at onset of gout flare may take an additional 0.6 mg an hour later as needed. Do not repeat for 3 days     empagliflozin (JARDIANCE) 25 MG TABS tablet Take 25 mg by mouth daily.     EPINEPHrine  (EPI-PEN) 0.3 mg/0.3 mL SOAJ injection Inject 0.3 mg into the muscle as needed for anaphylaxis.     furosemide  (LASIX ) 40 MG tablet Take 40 mg by mouth daily.     gabapentin (NEURONTIN) 300 MG capsule  Take 300 mg by mouth 3 (three) times daily.      glipiZIDE (GLUCOTROL) 10 MG tablet Take 10 mg by mouth 2 (two) times daily.     Krill Oil 1000 MG CAPS Take 1,000 mg by mouth daily.     lisinopril  (ZESTRIL ) 40 MG tablet Take 40 mg by mouth daily.     metFORMIN (GLUCOPHAGE) 1000 MG tablet Take 1 tablet by mouth 2 (two) times daily with a meal.     NON FORMULARY Pt uses a c-pap nightly     PREVIDENT 5000 DRY MOUTH 1.1 %  GEL dental gel Place 1 Application onto teeth at bedtime.     Semaglutide, 2 MG/DOSE, (OZEMPIC, 2 MG/DOSE,) 8 MG/3ML SOPN Inject 2 mg into the skin every Sunday.     silver sulfADIAZINE (SILVADENE) 1 % cream Apply 1 application. topically daily as needed (diabetic related sores).     SODIUM FLUORIDE 5000 SENSITIVE 1.1-5 % GEL 2 (two) times daily.     spironolactone  (ALDACTONE ) 25 MG tablet Take 0.5 tablets (12.5 mg total) by mouth 2 (two) times daily. 30 tablet 5   tretinoin (RETIN-A) 0.025 % cream Apply 1 Application topically at bedtime as needed (acne). Pea-sized amount to entire face at bedtime.     No current facility-administered medications on file prior to visit.      HPI   Bridget Little is a 63 y.o.-year-old female, referred by her PCP, for evaluation for multinodular goiter.  Thyroid  U/S: 01/05/24 CLINICAL DATA:  Palpable abnormality.   EXAM: THYROID  ULTRASOUND   TECHNIQUE: Ultrasound examination of the thyroid  gland and adjacent soft tissues was performed.   COMPARISON:  None Available.   FINDINGS: Parenchymal Echotexture: Moderately heterogenous   Isthmus: 1.1 cm   Right lobe: 7.1 x 3.0 x 2.5 cm   Left lobe: 9.5 x 4.7 x 5.2 cm   _________________________________________________________   Estimated total number of nodules >/= 1 cm: 6-10   Number of spongiform nodules >/=  2 cm not described below (TR1): 0   Number of mixed cystic and solid nodules >/= 1.5 cm not described below (TR2): 0    _________________________________________________________   Diffusely enlarged, heterogeneous and lobular thyroid  gland nearly entirely replaced by innumerable similar appearing thyroid  nodules. Current TI-RADS recommendations suggest following only up to 4 nodules at a time. Therefore, the 4 largest nodules will be enumerated.   Nodule # 1: Isoechoic solid nodule in the right upper gland measures 2.3 x 1.9 x 1.2 cm. Findings are consistent with TI-RADS category 3. *Given size (>/= 1.5 - 2.4 cm) and appearance, a follow-up ultrasound in 1 year should be considered based on TI-RADS criteria.   Nodule # 2: Vague region of pseudo nodularity in the right mid gland which does not appear to reflect a true nodule.   Nodule # 5: Predominantly solid isoechoic nodule in the left mid gland measures 2.4 x 2.2 x 1.3 cm. Findings are consistent with TI-RADS category 3. *Given size (>/= 1.5 - 2.4 cm) and appearance, a follow-up ultrasound in 1 year should be considered based on TI-RADS criteria.   Nodule # 6: Vague heterogeneity occupying the majority of the left mid gland without discrete margins. No definite thyroid  nodule.   Nodule # 7: Isoechoic predominantly solid nodule in the left mid gland measures 2.3 x 1.9 x 1.8 cm. Findings are consistent with TI-RADS category 3. *Given size (>/= 1.5 - 2.4 cm) and appearance, a follow-up ultrasound in 1 year should be considered based on TI-RADS criteria.   IMPRESSION: 1. Markedly enlarged, heterogeneous and multinodular thyroid  gland most consistent with multinodular goiter. There are numerous vague regions of pseudo nodularity. 2. The following discrete thyroid  nodules meet criteria for imaging surveillance: Nodules # 1, 5 and 7. Recommend follow-up ultrasound in 1 year.   The above is in keeping with the ACR TI-RADS recommendations - J Am Coll Radiol 2017;14:587-595.     Electronically Signed   By: Wilkie Lent M.D.   On: 01/11/2024  06:06   I reviewed pt's thyroid  tests: Lab Results  Component Value Date  TSH 0.671 01/05/2024   TSH 1.851 04/16/2023   TSH 2.752 10/22/2013   FREET4 1.03 04/16/2023     Pt denies - feeling nodules in neck - hoarseness - dysphagia - choking - SOB with lying down-although she does have OSA and CHF  No FH of thyroid  ds. No FH of thyroid  cancer. No h/o radiation tx to head or neck.  No seaweed or kelp. No recent contrast studies. No steroid use. No herbal supplements. No Biotin supplements or Hair, Skin and Nails vitamins.  Pt also has a history of CHF, cardiomyopathy, OSA, HTN, DM.  Review of systems  Constitutional: + Minimally fluctuating body weight,  current Body mass index is 37.76 kg/m. , no fatigue, no subjective hyperthermia, no subjective hypothermia Eyes: no blurry vision, no xerophthalmia ENT: no sore throat, no nodules palpated in throat, no dysphagia/odynophagia, no hoarseness Cardiovascular: no chest pain, no shortness of breath, no palpitations, no leg swelling Respiratory: no cough, no shortness of breath Gastrointestinal: no nausea/vomiting/diarrhea Musculoskeletal: no muscle/joint aches Skin: no rashes, no hyperemia Neurological: no tremors, no numbness, no tingling, no dizziness Psychiatric: no depression, no anxiety  ---------------------------------------------------------------------------------------------------------------------- Objective    BP 108/74 (BP Location: Left Arm, Patient Position: Sitting, Cuff Size: Large)   Pulse 99   Ht 5' 4 (1.626 m)   Wt 220 lb (99.8 kg)   LMP 05/19/2012   BMI 37.76 kg/m    BP Readings from Last 3 Encounters:  05/03/24 108/74  02/28/24 108/74  12/27/23 124/80    Wt Readings from Last 3 Encounters:  05/03/24 220 lb (99.8 kg)  02/28/24 219 lb 9.6 oz (99.6 kg)  12/27/23 221 lb (100.2 kg)     Physical Exam- Limited  Constitutional:  Body mass index is 37.76 kg/m. , not in acute distress, normal  state of mind Eyes:  EOMI, no exophthalmos Neck: Supple Thyroid : ++ gross goiter Cardiovascular: mildy tachycardic, no murmurs, rubs, or gallops, + BLE edema Respiratory: Adequate breathing efforts, no crackles, rales, rhonchi, or wheezing Musculoskeletal: no gross deformities, strength intact in all four extremities, no gross restriction of joint movements Skin:  no rashes, no hyperemia Neurological: no tremor with outstretched hands   ----------------------------------------------------------------------------------------------------------------------  ASSESSMENT / PLAN:  1. Thyroid  Nodule  - I reviewed the images of her thyroid  ultrasound along with the patient.  Overall, her thyroid  is enlarged.  She does have several nodules none of which recommend biopsy based on risk criteria, but nodules 1, 5, 7 all meet criteria for surveillance with another ultrasound in 1 year.  She denies any compressive symptoms at this time.  Will recheck more comprehensive thyroid  function panel to assess for any hormone production problems.  If thyroid  labs normal, will plan to repeat ultrasound in May of 2026 for surveillance (sooner if any new changes develop).    Follow Up Plan: Return will call with thyroid  labs and next steps.    I spent 42 minutes in the care of the patient today including review of labs from Thyroid  Function, CMP, and other relevant labs ; imaging/biopsy records (current and previous including abstractions from other facilities); face-to-face time discussing  her lab results and symptoms, medications doses, her options of short and long term treatment based on the latest standards of care / guidelines;   and documenting the encounter.  Bridget Little  participated in the discussions, expressed understanding, and voiced agreement with the above plans.  All questions were answered to her satisfaction. she is encouraged to contact clinic should she  have any questions or concerns  prior to her return visit.    Benton Rio, [facility] [address] [place], [place] [address] Phone: [phone] Fax: [phone]

## 2024-05-04 LAB — THYROGLOBULIN ANTIBODY: Thyroglobulin Antibody: <1 [IU]/mL (ref 0.0–0.9)

## 2024-05-04 LAB — TSH: TSH: 1.21 u[IU]/mL (ref 0.450–4.500)

## 2024-05-04 LAB — THYROID PEROXIDASE ANTIBODY: Thyroperoxidase Ab SerPl-aCnc: 17 [IU]/mL (ref 0–34)

## 2024-05-04 LAB — T3, FREE: T3, Free: 2.3 pg/mL (ref 2.0–4.4)

## 2024-05-04 LAB — T4, FREE: Free T4: 1.22 ng/dL (ref 0.82–1.77)

## 2024-05-07 ENCOUNTER — Ambulatory Visit: Payer: Self-pay | Admitting: Nurse Practitioner

## 2024-05-07 DIAGNOSIS — E042 Nontoxic multinodular goiter: Secondary | ICD-10-CM

## 2024-05-07 NOTE — Progress Notes (Signed)
 Please schedule patient for follow up in May of 2026.  She will need ultrasound and labs prior to visit- both have been entered.

## 2024-05-21 ENCOUNTER — Other Ambulatory Visit: Payer: Self-pay | Admitting: Nurse Practitioner

## 2024-05-21 DIAGNOSIS — E042 Nontoxic multinodular goiter: Secondary | ICD-10-CM

## 2024-05-21 NOTE — Progress Notes (Signed)
 Bridget Little reached out to her.  I ordered her follow up US  and labs for May 2026, need to schedule for follow up around that time too.

## 2024-05-21 NOTE — Telephone Encounter (Signed)
 This pt has not read her mychart for results - FYI

## 2024-05-21 NOTE — Progress Notes (Signed)
 Appt made and mailed lab order to pt

## 2024-06-18 ENCOUNTER — Ambulatory Visit: Admitting: Nurse Practitioner

## 2024-06-25 ENCOUNTER — Encounter: Payer: Self-pay | Admitting: Cardiology

## 2024-06-25 ENCOUNTER — Ambulatory Visit: Attending: Cardiology | Admitting: Cardiology

## 2024-06-25 VITALS — BP 126/81 | HR 106 | Ht 64.0 in | Wt 221.0 lb

## 2024-06-25 DIAGNOSIS — E782 Mixed hyperlipidemia: Secondary | ICD-10-CM | POA: Diagnosis not present

## 2024-06-25 DIAGNOSIS — I5022 Chronic systolic (congestive) heart failure: Secondary | ICD-10-CM

## 2024-06-25 DIAGNOSIS — I10 Essential (primary) hypertension: Secondary | ICD-10-CM

## 2024-06-25 NOTE — Patient Instructions (Signed)
 Medication Instructions:  Your physician recommends that you continue on your current medications as directed. Please refer to the Current Medication list given to you today.  *If you need a refill on your cardiac medications before your next appointment, please call your pharmacy*  Lab Work: Fasting Lipid Panel If you have labs (blood work) drawn today and your tests are completely normal, you will receive your results only by: MyChart Message (if you have MyChart) OR A paper copy in the mail If you have any lab test that is abnormal or we need to change your treatment, we will call you to review the results.  Testing/Procedures: Your physician has requested that you have an echocardiogram. Echocardiography is a painless test that uses sound waves to create images of your heart. It provides your doctor with information about the size and shape of your heart and how well your heart's chambers and valves are working. This procedure takes approximately one hour. There are no restrictions for this procedure. Please do NOT wear cologne, perfume, aftershave, or lotions (deodorant is allowed). Please arrive 15 minutes prior to your appointment time.  Please note: We ask at that you not bring children with you during ultrasound (echo/ vascular) testing. Due to room size and safety concerns, children are not allowed in the ultrasound rooms during exams. Our front office staff cannot provide observation of children in our lobby area while testing is being conducted. An adult accompanying a patient to their appointment will only be allowed in the ultrasound room at the discretion of the ultrasound technician under special circumstances. We apologize for any inconvenience.   Follow-Up: At [facility], you and your health needs are our priority.  As part of our continuing mission to provide you with exceptional heart care, our providers are all part of one team.  This team includes your primary  Cardiologist (physician) and Advanced Practice Providers or APPs (Physician Assistants and Nurse Practitioners) who all work together to provide you with the care you need, when you need it.  Your next appointment:   6 month(s)  Provider:   You may see Alvan Carrier, MD or one of the following Advanced Practice Providers on your designated Care Team:   Laymon Qua, PA-C  Scotesia Blythe, [place] Olivia Pavy, [place]     We recommend signing up for the patient portal called MyChart.  Sign up information is provided on this After Visit Summary.  MyChart is used to connect with patients for Virtual Visits (Telemedicine).  Patients are able to view lab/test results, encounter notes, upcoming appointments, etc.  Non-urgent messages can be sent to your provider as well.   To learn more about what you can do with MyChart, go to [email or web address].   Other Instructions

## 2024-06-25 NOTE — Progress Notes (Signed)
 Clinical Summary Bridget Little is a 63 y.o.female seen today for follow up of the following medical problems.    1. NICM /HFimpEF - 10/2013 echo LVEF 25-30%, grade I diastolic dysfunction   - cath 01/7983 with patent coronaries - repeat echo 02/2014 after course of medical therapy showed normalization of LVEF at 60-65%.    06/2023 LVEF 45%, no WMAs, grade I dd - 01/2024 echo: LVEF 45, normal RV     -03/2023  admission with AKI and hypontesion,HF meds held in setting of COVID pneumonia - remains off lisinopril  - no recent SOB/DOE, occasonal LE edema. Home weights stable around 226-228 lbs - compliant with meds.     Unable to have a cardiac MRI due to metal along her spinal column   - No SOB/DOE, no recent edema -compliant with meds  2. HTN   - compliant with meds - 120s/80s   3. OSA -followed by Dr Jude - using cpap   4. Hyperlipidemia - remains compliant with statin -07/2020 TG 196 HDL 30 LDL 58   - 10/2021 TC 128 TG 182 HDL 43 LDL 49 Jan 2024 TC 871 TG 834 HDL 42 LDL 53     Upcoming cruise to [place] Past Medical History:  Diagnosis Date   CHF (congestive heart failure) (HCC)    a. EF 25-30% in 2015 with cath showing patent cors b. EF normalized to 60-65% by repeat imaging. C. EF at 45% by echocardiogram in 06/2023   Chronic back pain    Diabetes mellitus, type II (HCC)    Essential hypertension, benign    Hypercholesteremia    Neuromuscular disorder (HCC)    right foot   Sleep apnea      Allergies  Allergen Reactions   Bee Venom Anaphylaxis and Hives     Current Outpatient Medications  Medication Sig Dispense Refill   allopurinol (ZYLOPRIM) 100 MG tablet Take 100 mg by mouth 2 (two) times daily.     aspirin  81 MG tablet Take 81 mg by mouth daily.     atorvastatin  (LIPITOR) 40 MG tablet Take 40 mg by mouth daily.     Calcium  Carb-Cholecalciferol (CALCIUM  HIGH POTENCY/VITAMIN D) 600-5 MG-MCG TABS Take 1 tablet by mouth daily.     carvedilol  (COREG )  12.5 MG tablet Take 1 tablet (12.5 mg total) by mouth 2 (two) times daily. 180 tablet 3   colchicine 0.6 MG tablet Take 0.6-1.2 mg by mouth See admin instructions. Take 1.2 mg as needed at onset of gout flare may take an additional 0.6 mg an hour later as needed. Do not repeat for 3 days     empagliflozin (JARDIANCE) 25 MG TABS tablet Take 25 mg by mouth daily.     EPINEPHrine  (EPI-PEN) 0.3 mg/0.3 mL SOAJ injection Inject 0.3 mg into the muscle as needed for anaphylaxis.     furosemide  (LASIX ) 40 MG tablet Take 40 mg by mouth daily.     gabapentin (NEURONTIN) 300 MG capsule Take 300 mg by mouth 3 (three) times daily.      glipiZIDE (GLUCOTROL) 10 MG tablet Take 10 mg by mouth 2 (two) times daily.     Krill Oil 1000 MG CAPS Take 1,000 mg by mouth daily.     lisinopril  (ZESTRIL ) 40 MG tablet Take 40 mg by mouth daily.     metFORMIN (GLUCOPHAGE) 1000 MG tablet Take 1 tablet by mouth 2 (two) times daily with a meal.     NON FORMULARY Pt uses a  c-pap nightly     PREVIDENT 5000 DRY MOUTH 1.1 % GEL dental gel Place 1 Application onto teeth at bedtime.     Semaglutide, 2 MG/DOSE, (OZEMPIC, 2 MG/DOSE,) 8 MG/3ML SOPN Inject 2 mg into the skin every Sunday.     silver sulfADIAZINE (SILVADENE) 1 % cream Apply 1 application. topically daily as needed (diabetic related sores).     SODIUM FLUORIDE 5000 SENSITIVE 1.1-5 % GEL 2 (two) times daily.     spironolactone  (ALDACTONE ) 25 MG tablet Take 0.5 tablets (12.5 mg total) by mouth 2 (two) times daily. 30 tablet 5   tretinoin (RETIN-A) 0.025 % cream Apply 1 Application topically at bedtime as needed (acne). Pea-sized amount to entire face at bedtime.     No current facility-administered medications for this visit.     Past Surgical History:  Procedure Laterality Date   BACK SURGERY     Lumbar fusion   COLONOSCOPY  06/02/2012   Incomplete colonoscopy due to redundant colon.   COLONOSCOPY WITH PROPOFOL  N/A 12/14/2021   Procedure: COLONOSCOPY WITH PROPOFOL ;   Surgeon: Cindie Carlin POUR, DO;  Location: [facility];  Service: Endoscopy;  Laterality: N/A;  2:00pm   HYSTEROSCOPY WITH D & C N/A 07/05/2023   Procedure: DILATATION AND CURETTAGE /HYSTEROSCOPY; MIRENA  IUD INSERTION;  Surgeon: Marilynn Nest, DO;  Location: [facility];  Service: Gynecology;  Laterality: N/A;   LEFT AND RIGHT HEART CATHETERIZATION WITH CORONARY ANGIOGRAM N/A 10/24/2013   Procedure: LEFT AND RIGHT HEART CATHETERIZATION WITH CORONARY ANGIOGRAM;  Surgeon: Candyce GORMAN Reek, MD;  Location: [facility] C[facility] LAB;  Service: Cardiovascular;  Laterality: N/A;   POLYPECTOMY  12/14/2021   Procedure: POLYPECTOMY;  Surgeon: Cindie Carlin POUR, DO;  Location: [facility];  Service: Endoscopy;;   TUBAL LIGATION       Allergies  Allergen Reactions   Bee Venom Anaphylaxis and Hives      Family History  Problem Relation Age of Onset   Depression Mother    Prostate cancer Father    Schizophrenia Sister    Depression Sister    Colon cancer Neg Hx    Breast cancer Neg Hx    Colon polyps Neg Hx      Social History Bridget Little reports that she has never smoked. She has never used smokeless tobacco. Bridget Little reports that she does not currently use alcohol .   Physical Examination Today's Vitals   06/25/24 1435  BP: 126/81  Pulse: (!) 106  Weight: 221 lb (100.2 kg)  Height: 5' 4 (1.626 m)   Body mass index is 37.93 kg/m.  Gen: resting comfortably, no acute distress HEENT: no scleral icterus, pupils equal round and reactive, no palptable cervical adenopathy,  CV: RRR, no mrg, no jvd Resp: Clear to auscultation bilaterally GI: abdomen is soft, non-tender, non-distended, normal bowel sounds, no hepatosplenomegaly MSK: extremities are warm, no edema.  Skin: warm, no rash Neuro:  no focal deficits Psych: appropriate affect   Diagnostic Studies 10/2013 Echo   Study Conclusions  - Left ventricle: The cavity size was normal. Wall thickness was increased in a pattern of mild LVH.  Systolic function was severely reduced. The estimated ejection fraction was in the range of 25% to 30%. Possible severe hypokinesis of the anteroseptal myocardium. Doppler parameters are consistent with abnormal left ventricular relaxation (grade 1 diastolic dysfunction). - Aortic valve: Poorly visualized. Mildly calcified annulus. Probably trileaflet. No significant regurgitation. - Right ventricle: Systolic function was reduced. - Right atrium: Central venous pressure: 3mm Hg (  est). - Tricuspid valve: Physiologic regurgitation. - Pulmonary arteries: Systolic pressure could not be accurately estimated. - Pericardium, extracardiac: A prominent pericardial fat pad was present. Impressions:  - Extremely limited study. There is mild LVH, overall normal LV chamber size, LVEF roughly estimated at 25-30% based on available images. There appears to be significant anteroseptal hypokinesis based on some views. Optison  contrast may provide better images. There is grade 1 diastolic dysfunction. RV contraction is abnormal as well. No significant degree of mitral or tricuspid regurgitation noted, cannotassess PASP. CVP appears normal. Epicardial fat pad noted.  10/2013 Cath   Procedural Findings:   Hemodynamics:   AO: 172/87 mmHg   LV: 165/20 mmHg   LVEDP: 25 mmHg   Coronary angiography:   Coronary dominance: right   Left Main: normal   Left Anterior Descending (LAD): Normal in size with no significant disease.   1st diagonal (D1): Normal in size with no significant disease.   2nd diagonal (D2): Large in size with no significant disease.   3rd diagonal (D3): Normal in size with no significant disease.   Circumflex (LCx): Normal in size and nondominant the vessel is free of any significant disease.   1st obtuse marginal: Small in size with minor irregularities.   2nd obtuse marginal: Normal in size with no significant disease.   3rd obtuse marginal: Normal in size with no significant  disease   Right Coronary Artery: normal in size and dominant the vessel is free of any significant disease.   Posterior descending artery: large in size with no significant disease   Posterior AV segment: normal in size with no significant disease.   Posterolateral branchs: 3 normal size branchs which are free of significant disease. Left ventriculography: Left ventricular systolic function is severely reduced , LVEF is estimated at 25-30 %, there is no significant mitral regurgitation   Final Conclusions:   1. Normal coronary arteries.   2. Severely reduced LV systolic function with an ejection fraction of 25-30% due to nonischemic cardiomyopathy.   3. Moderately elevated left ventricular end-diastolic pressure   Recommendations:   Continue diuresis. Treat medically for nonischemic cardiomyopathy. Blood pressure control is recommended. The patient should be screened for sleep apnea and treated as indicated.       02/2014 Echo Study Conclusions  - Procedure narrative: Transthoracic echocardiography. Image   quality was adequate. The study was technically difficult, as a   result of poor sound wave transmission and body habitus.   Intravenous contrast (Optison ) was administered. - Left ventricle: The cavity size was normal. Wall thickness was   normal. Systolic function was normal. The estimated ejection   fraction was in the range of 60% to 65%. Wall motion was normal;   there were no regional wall motion abnormalities. Left   ventricular diastolic function parameters were normal. - Right ventricle: Not well visualized. Grossly appears normal in   size and function. RV TAPSE is 2.3 cm.   12/2013 sleep apnea IMPRESSION:  1. Moderate obstructive sleep apnea syndrome worse during REM sleep. A formal sleep titration CPAP recording is suggested. 2. Abnormal sleep architecture with poor sleep efficiency, fragmented sleep and absence of slow wave sleep.   Thanks for this referral.    06/2023 echo 1. Left ventricular ejection fraction, by estimation, is 45%. The left  ventricle has mildly decreased function. The left ventricle has no  regional wall motion abnormalities. There is mild left ventricular  hypertrophy. Left ventricular diastolic  parameters are consistent with Grade I diastolic  dysfunction (impaired  relaxation).   2. Right ventricular systolic function is normal. The right ventricular  size is normal.   3. The mitral valve is normal in structure. No evidence of mitral valve  regurgitation. No evidence of mitral stenosis.   4. The aortic valve has an indeterminant number of cusps. Aortic valve  regurgitation is not visualized. No aortic stenosis is present.   5. The inferior vena cava is normal in size with greater than 50%  respiratory variability, suggesting right atrial pressure of 3 mmHg.    01/2024 echo  1. Left ventricular ejection fraction, by estimation, is 45%. Left  ventricular ejection fraction by 3D volume is 47 %. The left ventricle has  mildly decreased function. Left ventricular endocardial border not  optimally defined to evaluate regional wall  motion. There is moderate asymmetric left ventricular hypertrophy of the  lateral segment. Indeterminate diastolic filling due to E-A fusion.   2. Right ventricular systolic function is normal. The right ventricular  size is normal. Tricuspid regurgitation signal is inadequate for assessing  PA pressure.   3. The mitral valve is normal in structure. No evidence of mitral valve  regurgitation. No evidence of mitral stenosis.   4. The aortic valve has an indeterminant number of cusps. Aortic valve  regurgitation is not visualized. No aortic stenosis is present.   5. The inferior vena cava is normal in size with greater than 50%  respiratory variability, suggesting right atrial pressure of 3 mmHg.   Assessment and Plan  1. HFmrEF - most recent echo shows LVEF 45%, continue medical therapy and  repeat echo 07/2024 - euvolemic without symptoms today   2. HTN   -at goal, continue current meds   3. Hyperlipidemia - repeat lipid pane   F/u 6months      Dorn PHEBE Ross, M.D.

## 2024-06-27 ENCOUNTER — Other Ambulatory Visit: Payer: Self-pay | Admitting: Cardiology

## 2024-06-27 MED ORDER — FUROSEMIDE 40 MG PO TABS: 40.0000 mg | ORAL_TABLET | Freq: Every day | ORAL | 3 refills | Status: DC

## 2024-06-27 NOTE — Telephone Encounter (Signed)
 Per note w/ B.Strader 02/2024- Continue Lasix  40 mg daily

## 2024-06-27 NOTE — Telephone Encounter (Signed)
*  STAT* If patient is at the pharmacy, call can be transferred to refill team.   1. Which medications need to be refilled? (please list name of each medication and dose if known)   furosemide  (LASIX ) 40 MG tablet    2. Which pharmacy/location (including street and city if local pharmacy) is medication to be sent to?  [facility] - [place], [place] - [address]      3. Do they need a 30 day or 90 day supply? 180 day supply   Pt is out of medication and stated she'll need a 180 supply due to her taking 1 tablet but sometimes 2 based on needs.

## 2024-06-28 ENCOUNTER — Ambulatory Visit: Admitting: Cardiology

## 2024-07-05 ENCOUNTER — Other Ambulatory Visit (HOSPITAL_COMMUNITY)
Admission: RE | Admit: 2024-07-05 | Discharge: 2024-07-05 | Disposition: A | Discharge status: Home / Self Care | Source: Ambulatory Visit | Attending: Cardiology | Admitting: Cardiology

## 2024-07-05 DIAGNOSIS — E782 Mixed hyperlipidemia: Secondary | ICD-10-CM | POA: Insufficient documentation

## 2024-07-05 LAB — LIPID PANEL
Cholesterol: 147 mg/dL (ref 0–200)
HDL: 38 mg/dL — ABNORMAL LOW (ref >40)
LDL Cholesterol: 70 mg/dL (ref 0–99)
Total CHOL/HDL Ratio: 3.9 ratio
Triglycerides: 195 mg/dL — ABNORMAL HIGH (ref <150)
VLDL: 39 mg/dL (ref 0–40)

## 2024-07-09 ENCOUNTER — Ambulatory Visit (HOSPITAL_BASED_OUTPATIENT_CLINIC_OR_DEPARTMENT_OTHER): Admitting: Pulmonary Disease

## 2024-07-09 ENCOUNTER — Ambulatory Visit (INDEPENDENT_AMBULATORY_CARE_PROVIDER_SITE_OTHER): Admitting: Nurse Practitioner

## 2024-07-09 ENCOUNTER — Encounter: Payer: Self-pay | Admitting: Nurse Practitioner

## 2024-07-09 VITALS — BP 118/72 | HR 109 | Temp 98.1°F | Ht 64.0 in | Wt 220.0 lb

## 2024-07-09 DIAGNOSIS — G4733 Obstructive sleep apnea (adult) (pediatric): Secondary | ICD-10-CM | POA: Diagnosis not present

## 2024-07-09 NOTE — Assessment & Plan Note (Signed)
 Moderate OSA on CPAP. Excellent compliance and control. Receives benefit from use. Aware of risks of untreated OSA. Understands proper care/use of device. Safe driving practices reviewed. Healthy weight management encouraged.  Patient Instructions  Continue to use CPAP every night, minimum of 4-6 hours a night.  Change equipment as directed. Wash your tubing with warm soap and water  daily, hang to dry. Wash humidifier portion weekly. Use bottled, distilled water  and change daily Be aware of reduced alertness and do not drive or operate heavy machinery if experiencing this or drowsiness.  Exercise encouraged, as tolerated. Healthy weight management discussed.  Avoid or decrease alcohol  consumption and medications that make you more sleepy, if possible. Notify if persistent daytime sleepiness occurs even with consistent use of PAP therapy.  Change CPAP supplies... Every month Mask cushions and/or nasal pillows CPAP machine filters Every 3 months Mask frame (not including the headgear) CPAP tubing Every 6 months Mask headgear Chin strap (if applicable) Humidifier water  tub  Follow up in one year, or sooner if needed

## 2024-07-09 NOTE — Patient Instructions (Signed)
 Continue to use CPAP every night, minimum of 4-6 hours a night.  Change equipment as directed. Wash your tubing with warm soap and water  daily, hang to dry. Wash humidifier portion weekly. Use bottled, distilled water  and change daily Be aware of reduced alertness and do not drive or operate heavy machinery if experiencing this or drowsiness.  Exercise encouraged, as tolerated. Healthy weight management discussed.  Avoid or decrease alcohol consumption and medications that make you more sleepy, if possible. Notify if persistent daytime sleepiness occurs even with consistent use of PAP therapy.  Change CPAP supplies... Every month Mask cushions and/or nasal pillows CPAP machine filters Every 3 months Mask frame (not including the headgear) CPAP tubing Every 6 months Mask headgear Chin strap (if applicable) Humidifier water  tub  Follow up in one year, or sooner if needed

## 2024-07-09 NOTE — Progress Notes (Signed)
 @Patient  ID: Bridget Little, female    DOB: 19-May-1961, 63 y.o.   MRN: [id number]  Chief Complaint  Patient presents with   Obstructive Sleep Apnea    Referring provider: Naomia Cough, DO  HPI: 63 year old female, never smoker followed for OSA on CPAP. She is a patient of Dr. Cyndi and last seen in office 05/10/2022. Past medical history significant for NICM, HTN, DM, obesity.   TEST/EVENTS:  01/2014 NPSG: AHI 27/h, SpO2 low 75% 02/2021 HST: AHI 15/h  05/10/2022: OV with Dr. Jude. CPAP setup 2022 with FFM. Tried nasal mask first then settled down with the full face. Improved daytime fatigue and has more energy.   07/09/2024: Today - follow up Patient presents today for follow up. She is doing very well with her CPAP. Wears it every night. She sleeps well with it. Energy levels are good during the day with us . No issues with drowsy driving. Mask is comfortable; wearing a medium FFM. No issues with pressure settings. She has no other concerns or complaints today.    Allergies  Allergen Reactions   Bee Venom Anaphylaxis and Hives    Immunization History  Administered Date(s) Administered   Influenza Inj Mdck Quad Pf 05/06/2021   Influenza Split 05/06/2021   Influenza, Seasonal, Injecte, Preservative Fre 06/20/2023   Influenza,inj,Quad PF,6+ Mos 05/12/2015, 07/14/2016, 06/22/2019, 09/20/2022   Influenza-Unspecified 06/02/2020   Moderna Covid-19 Vaccine Bivalent Booster 53yrs & up 06/17/2021   Moderna Sars-Covid-2 Vaccination 11/16/2019, 12/14/2019, 06/30/2020   PNEUMOCOCCAL CONJUGATE-20 02/11/2022   Pneumococcal Polysaccharide-23 04/03/2015   Tdap 09/09/2021   Zoster Recombinant(Shingrix) 08/19/2021, 11/11/2021   Zoster, Live 08/19/2021    Past Medical History:  Diagnosis Date   CHF (congestive heart failure) (HCC)    a. EF 25-30% in 2015 with cath showing patent cors b. EF normalized to 60-65% by repeat imaging. C. EF at 45% by echocardiogram in 06/2023   Chronic  back pain    Diabetes mellitus, type II (HCC)    Essential hypertension, benign    Hypercholesteremia    Neuromuscular disorder (HCC)    right foot   Sleep apnea     Tobacco History: Social History   Tobacco Use  Smoking Status Never  Smokeless Tobacco Never   Counseling given: Not Answered   Outpatient Medications Prior to Visit  Medication Sig Dispense Refill   allopurinol (ZYLOPRIM) 100 MG tablet Take 100 mg by mouth 2 (two) times daily.     aspirin  81 MG tablet Take 81 mg by mouth daily.     atorvastatin  (LIPITOR) 40 MG tablet Take 40 mg by mouth daily.     Calcium  Carb-Cholecalciferol (CALCIUM  HIGH POTENCY/VITAMIN D) 600-5 MG-MCG TABS Take 1 tablet by mouth daily.     carvedilol  (COREG ) 12.5 MG tablet Take 1 tablet (12.5 mg total) by mouth 2 (two) times daily. 180 tablet 3   colchicine 0.6 MG tablet Take 0.6-1.2 mg by mouth See admin instructions. Take 1.2 mg as needed at onset of gout flare may take an additional 0.6 mg an hour later as needed. Do not repeat for 3 days     empagliflozin (JARDIANCE) 25 MG TABS tablet Take 25 mg by mouth daily.     EPINEPHrine  (EPI-PEN) 0.3 mg/0.3 mL SOAJ injection Inject 0.3 mg into the muscle as needed for anaphylaxis.     furosemide  (LASIX ) 40 MG tablet Take 1 tablet (40 mg total) by mouth daily. 30 tablet 3   gabapentin (NEURONTIN) 300 MG capsule Take 300  mg by mouth 3 (three) times daily.      glipiZIDE (GLUCOTROL) 10 MG tablet Take 10 mg by mouth 2 (two) times daily.     Krill Oil 1000 MG CAPS Take 1,000 mg by mouth daily.     lisinopril  (ZESTRIL ) 40 MG tablet Take 40 mg by mouth daily.     metFORMIN (GLUCOPHAGE) 1000 MG tablet Take 1 tablet by mouth 2 (two) times daily with a meal.     NON FORMULARY Pt uses a c-pap nightly     PREVIDENT 5000 DRY MOUTH 1.1 % GEL dental gel Place 1 Application onto teeth at bedtime.     Semaglutide, 2 MG/DOSE, (OZEMPIC, 2 MG/DOSE,) 8 MG/3ML SOPN Inject 2 mg into the skin every Sunday.     silver  sulfADIAZINE (SILVADENE) 1 % cream Apply 1 application. topically daily as needed (diabetic related sores).     SODIUM FLUORIDE 5000 SENSITIVE 1.1-5 % GEL 2 (two) times daily.     spironolactone  (ALDACTONE ) 25 MG tablet Take 0.5 tablets (12.5 mg total) by mouth 2 (two) times daily. 30 tablet 5   tretinoin (RETIN-A) 0.025 % cream Apply 1 Application topically at bedtime as needed (acne). Pea-sized amount to entire face at bedtime.     No facility-administered medications prior to visit.     Review of Systems: as above     Physical Exam:  BP 118/72   Pulse (!) 109   Temp 98.1 F (36.7 C) (Temporal)   Ht 5' 4 (1.626 m)   Wt 220 lb (99.8 kg)   LMP 05/19/2012   SpO2 98%   BMI 37.76 kg/m   GEN: Pleasant, interactive, well-kempt; obese; in no acute distress HEENT:  Normocephalic and atraumatic. PERRLA. Sclera white. Nasal turbinates pink, moist and patent bilaterally. No rhinorrhea present. Oropharynx pink and moist, without exudate or edema. No lesions, ulcerations, or postnasal drip. Mallampati IV  NECK:  Supple w/ fair ROM. No JVD present. No lymphadenopathy.   CV: RRR, no m/r/g, no peripheral edema. Pulses intact, +2 bilaterally. No cyanosis, pallor or clubbing. PULMONARY:  Unlabored, regular breathing. Clear bilaterally A&P w/o wheezes/rales/rhonchi. No accessory muscle use.  GI: BS present and normoactive. Soft, non-tender to palpation.  MSK: No erythema, warmth or tenderness.  Neuro: A/Ox3. No focal deficits noted.   Skin: Warm, no lesions or rashe Psych: Normal affect and behavior. Judgement and thought content appropriate.     Lab Results:  CBC    Component Value Date/Time   WBC 9.2 01/05/2024 1323   RBC 4.85 01/05/2024 1323   HGB 13.2 01/05/2024 1323   HCT 41.0 01/05/2024 1323   PLT 334 01/05/2024 1323   MCV 84.5 01/05/2024 1323   MCH 27.2 01/05/2024 1323   MCHC 32.2 01/05/2024 1323   RDW 18.3 (H) 01/05/2024 1323   LYMPHSABS 1.6 10/22/2013 0424   MONOABS 1.1  (H) 10/22/2013 0424   EOSABS 0.1 10/22/2013 0424   BASOSABS 0.0 10/22/2013 0424    BMET    Component Value Date/Time   NA 141 01/05/2024 1323   K 4.0 01/05/2024 1323   CL 106 01/05/2024 1323   CO2 24 01/05/2024 1323   GLUCOSE 80 01/05/2024 1323   BUN 23 01/05/2024 1323   CREATININE 0.92 01/05/2024 1323   CALCIUM  9.4 01/05/2024 1323   GFRNONAA >60 01/05/2024 1323   GFRAA 85 (L) 10/27/2013 0335    BNP    Component Value Date/Time   BNP 19.0 04/18/2023 0351     Imaging:  No  results found.  Administration History     None           No data to display          No results found for: NITRICOXIDE      Assessment & Plan:   OSA (obstructive sleep apnea) Moderate OSA on CPAP. Excellent compliance and control. Receives benefit from use. Aware of risks of untreated OSA. Understands proper care/use of device. Safe driving practices reviewed. Healthy weight management encouraged.  Patient Instructions  Continue to use CPAP every night, minimum of 4-6 hours a night.  Change equipment as directed. Wash your tubing with warm soap and water  daily, hang to dry. Wash humidifier portion weekly. Use bottled, distilled water  and change daily Be aware of reduced alertness and do not drive or operate heavy machinery if experiencing this or drowsiness.  Exercise encouraged, as tolerated. Healthy weight management discussed.  Avoid or decrease alcohol  consumption and medications that make you more sleepy, if possible. Notify if persistent daytime sleepiness occurs even with consistent use of PAP therapy.  Change CPAP supplies... Every month Mask cushions and/or nasal pillows CPAP machine filters Every 3 months Mask frame (not including the headgear) CPAP tubing Every 6 months Mask headgear Chin strap (if applicable) Humidifier water  tub  Follow up in one year, or sooner if needed    Advised if symptoms do not improve or worsen, to please contact office for sooner  follow up or seek emergency care.   I spent 21 minutes of dedicated to the care of this patient on the date of this encounter to include pre-visit review of records, face-to-face time with the patient discussing conditions above, post visit ordering of testing, clinical documentation with the electronic health record, making appropriate referrals as documented, and communicating necessary findings to members of the patients care team.  Comer LULLA Rouleau, NP 07/09/2024  Pt aware and understands NP's role.

## 2024-07-13 ENCOUNTER — Telehealth (HOSPITAL_BASED_OUTPATIENT_CLINIC_OR_DEPARTMENT_OTHER): Payer: Self-pay

## 2024-07-13 NOTE — Telephone Encounter (Signed)
 CMN received for CPAP supplies sent to [facility] [place] signed by provider and faxed confirmation received

## 2024-08-01 ENCOUNTER — Ambulatory Visit (HOSPITAL_COMMUNITY): Admission: RE | Admit: 2024-08-01 | Discharge: 2024-08-01 | Attending: Cardiology

## 2024-08-01 ENCOUNTER — Telehealth: Payer: Self-pay | Admitting: Cardiology

## 2024-08-01 DIAGNOSIS — I5022 Chronic systolic (congestive) heart failure: Secondary | ICD-10-CM | POA: Insufficient documentation

## 2024-08-01 LAB — ECHOCARDIOGRAM COMPLETE
AR max vel: 1.62 cm2
AV Area VTI: 2.32 cm2
AV Area mean vel: 1.76 cm2
AV Mean grad: 5.1 mmHg
AV Peak grad: 11.1 mmHg
Ao pk vel: 1.66 m/s
Area-P 1/2: 5.54 cm2
Calc EF: 37.6 %
S' Lateral: 4 cm
Single Plane A2C EF: 28.8 %
Single Plane A4C EF: 44.5 %

## 2024-08-01 NOTE — Telephone Encounter (Signed)
° °  Pre-operative Risk Assessment    Patient Name: Bridget Little  DOB: 1961/04/21 MRN: [id number]      Request for Surgical Clearance    Procedure:  laparoscopic bariatric surgery   Date of Surgery:  Clearance TBD                                 Surgeon:  Not indicated  Surgeon's Group or Practice Name:  [facility]  Phone number:  [phone] Fax number:  [phone]   Type of Clearance Requested:   - Medical    Type of Anesthesia:  Not Indicated   Additional requests/questions:    [name]   08/01/2024, 1:14 PM

## 2024-08-01 NOTE — Progress Notes (Signed)
*  PRELIMINARY RESULTS* Echocardiogram 2D Echocardiogram has been performed.  Bridget Little 08/01/2024, 3:03 PM

## 2024-08-07 ENCOUNTER — Telehealth (HOSPITAL_BASED_OUTPATIENT_CLINIC_OR_DEPARTMENT_OTHER): Payer: Self-pay | Admitting: *Deleted

## 2024-08-07 NOTE — Telephone Encounter (Signed)
 Pt has been scheduled tele preop appt 08/09/24, per pt surgery will not be scheduled until she has been cleared. Pt said  she had her echo done as well. Med rec and consent are done.

## 2024-08-07 NOTE — Telephone Encounter (Signed)
 Pt has been scheduled tele preop appt 08/09/24, per pt surgery will not be scheduled until she has been cleared. Pt said  she had her echo done as well. Med rec and consent are done.      Patient Consent for Virtual Visit        Bridget Little has provided verbal consent on 08/07/2024 for a virtual visit (video or telephone).   CONSENT FOR VIRTUAL VISIT FOR:  Bridget Little  By participating in this virtual visit I agree to the following:  I hereby voluntarily request, consent and authorize [facility] and its employed or contracted physicians, physician assistants, nurse practitioners or other licensed health care professionals (the Practitioner), to provide me with telemedicine health care services (the Services) as deemed necessary by the treating Practitioner. I acknowledge and consent to receive the Services by the Practitioner via telemedicine. I understand that the telemedicine visit will involve communicating with the Practitioner through live audiovisual communication technology and the disclosure of certain medical information by electronic transmission. I acknowledge that I have been given the opportunity to request an in-person assessment or other available alternative prior to the telemedicine visit and am voluntarily participating in the telemedicine visit.  I understand that I have the right to withhold or withdraw my consent to the use of telemedicine in the course of my care at any time, without affecting my right to future care or treatment, and that the Practitioner or I may terminate the telemedicine visit at any time. I understand that I have the right to inspect all information obtained and/or recorded in the course of the telemedicine visit and may receive copies of available information for a reasonable fee.  I understand that some of the potential risks of receiving the Services via telemedicine include:  Delay or interruption in medical evaluation due  to technological equipment failure or disruption; Information transmitted may not be sufficient (e.g. poor resolution of images) to allow for appropriate medical decision making by the Practitioner; and/or  In rare instances, security protocols could fail, causing a breach of personal health information.  Furthermore, I acknowledge that it is my responsibility to provide information about my medical history, conditions and care that is complete and accurate to the best of my ability. I acknowledge that Practitioner's advice, recommendations, and/or decision may be based on factors not within their control, such as incomplete or inaccurate data provided by me or distortions of diagnostic images or specimens that may result from electronic transmissions. I understand that the practice of medicine is not an exact science and that Practitioner makes no warranties or guarantees regarding treatment outcomes. I acknowledge that a copy of this consent can be made available to me via my patient portal [facility] MyChart), or I can request a printed copy by calling the office of [facility].    I understand that my insurance will be billed for this visit.   I have read or had this consent read to me. I understand the contents of this consent, which adequately explains the benefits and risks of the Services being provided via telemedicine.  I have been provided ample opportunity to ask questions regarding this consent and the Services and have had my questions answered to my satisfaction. I give my informed consent for the services to be provided through the use of telemedicine in my medical care

## 2024-08-08 ENCOUNTER — Ambulatory Visit: Payer: Self-pay | Admitting: Cardiology

## 2024-08-09 ENCOUNTER — Ambulatory Visit: Attending: Internal Medicine

## 2024-08-09 ENCOUNTER — Encounter: Payer: Self-pay | Admitting: *Deleted

## 2024-08-09 DIAGNOSIS — Z01810 Encounter for preprocedural cardiovascular examination: Secondary | ICD-10-CM

## 2024-08-09 NOTE — Progress Notes (Signed)
 Virtual Visit via Telephone Note   Because of Bridget Little co-morbid illnesses, she is at least at moderate risk for complications without adequate follow up.  This format is felt to be most appropriate for this patient at this time.  Due to technical limitations with video connection (technology), today's appointment will be conducted as an audio only telehealth visit, and Bridget Little verbally agreed to proceed in this manner.   All issues noted in this document were discussed and addressed.  No physical exam could be performed with this format.  Evaluation Performed:  Preoperative cardiovascular risk assessment _____________   Date:  08/09/2024   Patient ID:  Bridget Little, DOB 08-17-1961, MRN [id number] Patient Location:  Home Provider location:   Office  Primary Care Provider:  Naomia Cough, DO Primary Cardiologist:  Alvan Carrier, MD  Chief Complaint / Patient Profile   63 y.o. y/o female with a h/o NCIM/HFimpEF/CHF, T2DM, HTN, OSA who is pending laparoscopic bariatric surgery scheduled TBD and presents today for telephonic preoperative cardiovascular risk assessment.  History of Present Illness    Bridget Little is a 63 y.o. female who presents via audio/video conferencing for a telehealth visit today.  Pt was last seen in cardiology clinic on 06/25/2024 by Dr. Alvan.  At that time Bridget Little was doing well.  The patient is now pending procedure as outlined above. Since her last visit, she is doing well. She denies chest pain, palpitations, dyspnea, orthopnea, n, v, dark/tarry/bloody stools, hematuria, dizziness, syncope, weight gain.  She has chronic lower extremity edema that is well managed with ted hose and leg elevation, no change from baseline. Stays at home alone most days while her husband works, no difficulties with sweeping the house and grocery shopping. Feels like her volume status is well managed with her lasix .   Past Medical  History    Past Medical History:  Diagnosis Date   CHF (congestive heart failure) (HCC)    a. EF 25-30% in 2015 with cath showing patent cors b. EF normalized to 60-65% by repeat imaging. C. EF at 45% by echocardiogram in 06/2023   Chronic back pain    Diabetes mellitus, type II (HCC)    Essential hypertension, benign    Hypercholesteremia    Neuromuscular disorder (HCC)    right foot   Sleep apnea    Past Surgical History:  Procedure Laterality Date   BACK SURGERY     Lumbar fusion   COLONOSCOPY  06/02/2012   Incomplete colonoscopy due to redundant colon.   COLONOSCOPY WITH PROPOFOL  N/A 12/14/2021   Procedure: COLONOSCOPY WITH PROPOFOL ;  Surgeon: Cindie Carlin POUR, DO;  Location: [facility];  Service: Endoscopy;  Laterality: N/A;  2:00pm   HYSTEROSCOPY WITH D & C N/A 07/05/2023   Procedure: DILATATION AND CURETTAGE /HYSTEROSCOPY; MIRENA  IUD INSERTION;  Surgeon: Marilynn Nest, DO;  Location: [facility];  Service: Gynecology;  Laterality: N/A;   LEFT AND RIGHT HEART CATHETERIZATION WITH CORONARY ANGIOGRAM N/A 10/24/2013   Procedure: LEFT AND RIGHT HEART CATHETERIZATION WITH CORONARY ANGIOGRAM;  Surgeon: Candyce GORMAN Reek, MD;  Location: [facility] CATH LAB;  Service: Cardiovascular;  Laterality: N/A;   POLYPECTOMY  12/14/2021   Procedure: POLYPECTOMY;  Surgeon: Cindie Carlin POUR, DO;  Location: [facility];  Service: Endoscopy;;   TUBAL LIGATION      Allergies  Allergies[1]  Home Medications    Prior to Admission medications  Medication Sig Start Date End Date Taking? Authorizing Provider  allopurinol (  ZYLOPRIM) 100 MG tablet Take 100 mg by mouth 2 (two) times daily. 09/12/20   [provider]  aspirin  81 MG tablet Take 81 mg by mouth daily.    [provider]  atorvastatin  (LIPITOR) 40 MG tablet Take 40 mg by mouth daily.    [provider]  Calcium  Carb-Cholecalciferol (CALCIUM  HIGH POTENCY/VITAMIN D) 600-5 MG-MCG TABS Take 1 tablet by mouth daily.  09/25/20   [provider]  carvedilol  (COREG ) 12.5 MG tablet Take 1 tablet (12.5 mg total) by mouth 2 (two) times daily. 12/29/23 08/07/24  Dunn, Dayna N, PA-C  colchicine 0.6 MG tablet Take 0.6-1.2 mg by mouth See admin instructions. Take 1.2 mg as needed at onset of gout flare may take an additional 0.6 mg an hour later as needed. Do not repeat for 3 days 04/30/15   [provider]  empagliflozin (JARDIANCE) 25 MG TABS tablet Take 25 mg by mouth daily.    [provider]  EPINEPHrine  (EPI-PEN) 0.3 mg/0.3 mL SOAJ injection Inject 0.3 mg into the muscle as needed for anaphylaxis.    [provider]  furosemide  (LASIX ) 40 MG tablet Take 1 tablet (40 mg total) by mouth daily. 06/27/24   Alvan Dorn FALCON, MD  gabapentin (NEURONTIN) 300 MG capsule Take 300 mg by mouth 3 (three) times daily.  02/28/15   [provider]  glipiZIDE (GLUCOTROL) 10 MG tablet Take 10 mg by mouth 2 (two) times daily. 04/30/23   [provider]  Anselm Oil 1000 MG CAPS Take 1,000 mg by mouth daily.    [provider]  lisinopril  (ZESTRIL ) 40 MG tablet Take 40 mg by mouth daily.    [provider]  metFORMIN (GLUCOPHAGE) 1000 MG tablet Take 1 tablet by mouth 2 (two) times daily with a meal. 09/05/13   [provider]  NON FORMULARY Pt uses a c-pap nightly    [provider]  PREVIDENT 5000 DRY MOUTH 1.1 % GEL dental gel Place 1 Application onto teeth at bedtime. 02/07/23   [provider]  Semaglutide, 2 MG/DOSE, (OZEMPIC, 2 MG/DOSE,) 8 MG/3ML SOPN Inject 2 mg into the skin every Sunday.    [provider]  silver sulfADIAZINE (SILVADENE) 1 % cream Apply 1 application. topically daily as needed (diabetic related sores).    [provider]  SODIUM FLUORIDE 5000 SENSITIVE 1.1-5 % GEL 2 (two) times daily. 08/05/23   [provider]  spironolactone  (ALDACTONE ) 25 MG tablet Take 0.5 tablets (12.5 mg total) by mouth 2  (two) times daily. 10/28/13   Maryjo Dorise ORN, PA-C  tretinoin (RETIN-A) 0.025 % cream Apply 1 Application topically at bedtime as needed (acne). Pea-sized amount to entire face at bedtime. 01/13/22   [provider]    Physical Exam    Vital Signs:  Bridget Little does not have vital signs available for review today.  Given telephonic nature of communication, physical exam is limited. AAOx3. NAD. Normal affect.  Speech and respirations are unlabored.  Accessory Clinical Findings    None  Assessment & Plan    1.  Preoperative Cardiovascular Risk Assessment: According to the Revised Cardiac Risk Index (RCRI), her Perioperative Risk of Major Cardiac Event is (%): 6.6  Her Functional Capacity in METs is: 4.06 according to the Duke Activity Status Index (DASI). Therefore, based on ACC/AHA guidelines, patient would be considered high risk but acceptable risk for this high risk procedure, however further cardiac evaluation will not change how this is managed.  Functional METS >4.    The patient was advised that if she develops new symptoms prior to surgery to contact our office to arrange for a follow-up visit, and she verbalized understanding.  Regarding ASA therapy, we recommend continuation of ASA throughout the perioperative period. However, if the surgeon feels that cessation of ASA is required in the perioperative period, it may be stopped 5-7 days prior to surgery with a plan to resume it as soon as felt to be feasible from a surgical standpoint in the post-operative period.   A copy of this note will be routed to requesting surgeon.  Time:   Today, I have spent 10 minutes with the patient with telehealth technology discussing medical history, symptoms, and management plan.     Herson Prichard E Torrie Lafavor, NP  08/09/2024, 12:29 PM     [1]  Allergies Allergen Reactions   Bee Venom Anaphylaxis and Hives

## 2024-08-30 ENCOUNTER — Telehealth: Payer: Self-pay | Admitting: Cardiology

## 2024-08-30 NOTE — Telephone Encounter (Signed)
" *  STAT* If patient is at the pharmacy, call can be transferred to refill team.     1. Which medications need to be refilled? (please list name of each medication and dose if known) furosemide  (LASIX ) 40 MG tablet      2. Would you like to learn more about the convenience, safety, & potential cost savings by using the [facility]? No     3. Are you open to using the [facility] (Type [facility].  ).No     4. Which pharmacy/location (including street and city if local pharmacy) is medication to be sent to?[facility] - [place], [place] - [address]      5. Do they need a 30 day or 90 day supply? 90   Pt states she's been having to take an extra pill due to weight gain which is causing her to run out faster "

## 2024-08-31 MED ORDER — FUROSEMIDE 40 MG PO TABS: 40.0000 mg | ORAL_TABLET | Freq: Every day | ORAL | 3 refills | Status: AC

## 2024-08-31 NOTE — Telephone Encounter (Signed)
 RX sent in.

## 2024-09-11 ENCOUNTER — Ambulatory Visit: Payer: Self-pay | Admitting: Cardiology

## 2024-10-16 ENCOUNTER — Ambulatory Visit: Admitting: Obstetrics & Gynecology

## 2025-01-07 ENCOUNTER — Other Ambulatory Visit (HOSPITAL_COMMUNITY)

## 2025-01-08 ENCOUNTER — Ambulatory Visit: Admitting: Nurse Practitioner
